# Patient Record
Sex: Female | Born: 1980 | Hispanic: No | Marital: Married | Smoking: Former smoker
Health system: Southern US, Community
[De-identification: ages and names within clinical notes are randomized; demographics above are authoritative.]

## PROBLEM LIST (undated history)

## (undated) DIAGNOSIS — J45909 Unspecified asthma, uncomplicated: Secondary | ICD-10-CM

## (undated) DIAGNOSIS — Y990 Civilian activity done for income or pay: Secondary | ICD-10-CM

## (undated) DIAGNOSIS — Z0000 Encounter for general adult medical examination without abnormal findings: Secondary | ICD-10-CM

## (undated) DIAGNOSIS — Z590 Homelessness: Secondary | ICD-10-CM

## (undated) DIAGNOSIS — F640 Transsexualism: Secondary | ICD-10-CM

## (undated) DIAGNOSIS — A529 Late syphilis, unspecified: Secondary | ICD-10-CM

## (undated) DIAGNOSIS — G5600 Carpal tunnel syndrome, unspecified upper limb: Secondary | ICD-10-CM

## (undated) DIAGNOSIS — B20 Human immunodeficiency virus [HIV] disease: Secondary | ICD-10-CM

## (undated) DIAGNOSIS — F329 Major depressive disorder, single episode, unspecified: Secondary | ICD-10-CM

## (undated) DIAGNOSIS — S61419A Laceration without foreign body of unspecified hand, initial encounter: Secondary | ICD-10-CM

## (undated) DIAGNOSIS — K6289 Other specified diseases of anus and rectum: Secondary | ICD-10-CM

## (undated) DIAGNOSIS — Z21 Asymptomatic human immunodeficiency virus [HIV] infection status: Secondary | ICD-10-CM

## (undated) DIAGNOSIS — M79605 Pain in left leg: Secondary | ICD-10-CM

## (undated) HISTORY — DX: Encounter for general adult medical examination without abnormal findings: Z00.00

## (undated) HISTORY — DX: Homelessness: Z59.0

## (undated) HISTORY — DX: Transsexualism: F64.0

## (undated) HISTORY — DX: Human immunodeficiency virus (HIV) disease: B20

## (undated) HISTORY — DX: Civilian activity done for income or pay: Y99.0

## (undated) HISTORY — DX: Major depressive disorder, single episode, unspecified: F32.9

## (undated) HISTORY — DX: Pain in left leg: M79.605

## (undated) HISTORY — DX: Asymptomatic human immunodeficiency virus (hiv) infection status: Z21

## (undated) HISTORY — DX: Carpal tunnel syndrome, unspecified upper limb: G56.00

## (undated) HISTORY — PX: NASAL SEPTUM SURGERY: SHX37

## (undated) HISTORY — PX: FOOT SURGERY: SHX648

## (undated) HISTORY — DX: Laceration without foreign body of unspecified hand, initial encounter: S61.419A

## (undated) HISTORY — DX: Other specified diseases of anus and rectum: K62.89

## (undated) HISTORY — DX: Late syphilis, unspecified: A52.9

---
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
## 2002-06-09 ENCOUNTER — Emergency Department (HOSPITAL_COMMUNITY): Admission: EM | Admit: 2002-06-09 | Discharge: 2002-06-09 | Payer: Self-pay | Admitting: *Deleted

## 2002-10-01 ENCOUNTER — Emergency Department (HOSPITAL_COMMUNITY): Admission: EM | Admit: 2002-10-01 | Discharge: 2002-10-01 | Payer: Self-pay | Admitting: *Deleted

## 2003-04-27 ENCOUNTER — Emergency Department (HOSPITAL_COMMUNITY): Admission: EM | Admit: 2003-04-27 | Discharge: 2003-04-27 | Payer: Self-pay | Admitting: Emergency Medicine

## 2003-04-27 ENCOUNTER — Encounter: Payer: Self-pay | Admitting: Emergency Medicine

## 2003-04-28 ENCOUNTER — Encounter: Payer: Self-pay | Admitting: Emergency Medicine

## 2005-07-12 ENCOUNTER — Encounter (INDEPENDENT_AMBULATORY_CARE_PROVIDER_SITE_OTHER): Payer: Self-pay | Admitting: *Deleted

## 2005-07-12 LAB — CONVERTED CEMR LAB
CD4 Count: 202 microliters
CD4 T Cell Abs: 202

## 2005-08-06 ENCOUNTER — Ambulatory Visit: Payer: Self-pay | Admitting: Infectious Diseases

## 2005-08-06 ENCOUNTER — Ambulatory Visit (HOSPITAL_COMMUNITY): Admission: RE | Admit: 2005-08-06 | Discharge: 2005-08-06 | Payer: Self-pay | Admitting: Infectious Diseases

## 2005-08-06 ENCOUNTER — Encounter (INDEPENDENT_AMBULATORY_CARE_PROVIDER_SITE_OTHER): Payer: Self-pay | Admitting: *Deleted

## 2005-08-06 LAB — CONVERTED CEMR LAB
CD4 Count: 190 microliters
HIV 1 RNA Quant: 8320 copies/mL

## 2005-09-17 ENCOUNTER — Encounter (INDEPENDENT_AMBULATORY_CARE_PROVIDER_SITE_OTHER): Payer: Self-pay | Admitting: *Deleted

## 2005-09-17 ENCOUNTER — Ambulatory Visit: Payer: Self-pay | Admitting: Infectious Diseases

## 2005-09-17 ENCOUNTER — Ambulatory Visit (HOSPITAL_COMMUNITY): Admission: RE | Admit: 2005-09-17 | Discharge: 2005-09-17 | Payer: Self-pay | Admitting: Infectious Diseases

## 2005-09-17 LAB — CONVERTED CEMR LAB
CD4 Count: 380 microliters
HIV 1 RNA Quant: 399 copies/mL

## 2006-12-09 ENCOUNTER — Encounter (INDEPENDENT_AMBULATORY_CARE_PROVIDER_SITE_OTHER): Payer: Self-pay | Admitting: *Deleted

## 2006-12-09 LAB — CONVERTED CEMR LAB

## 2006-12-22 ENCOUNTER — Encounter (INDEPENDENT_AMBULATORY_CARE_PROVIDER_SITE_OTHER): Payer: Self-pay | Admitting: *Deleted

## 2014-12-13 ENCOUNTER — Telehealth: Payer: Self-pay

## 2014-12-13 NOTE — Telephone Encounter (Signed)
Left message to call for new intake appointment.   Just in case I have scheduled patient for 12-21-14 @ 10 :30   Rhonda Ayers King , RCharity fundraiser

## 2014-12-21 ENCOUNTER — Ambulatory Visit: Payer: Self-pay

## 2014-12-21 ENCOUNTER — Other Ambulatory Visit: Payer: Self-pay

## 2014-12-21 ENCOUNTER — Telehealth: Payer: Self-pay

## 2014-12-21 DIAGNOSIS — Z79899 Other long term (current) drug therapy: Secondary | ICD-10-CM

## 2014-12-21 DIAGNOSIS — B20 Human immunodeficiency virus [HIV] disease: Secondary | ICD-10-CM

## 2014-12-21 DIAGNOSIS — Z202 Contact with and (suspected) exposure to infections with a predominantly sexual mode of transmission: Secondary | ICD-10-CM

## 2014-12-21 DIAGNOSIS — Z21 Asymptomatic human immunodeficiency virus [HIV] infection status: Secondary | ICD-10-CM

## 2014-12-21 LAB — CBC WITH DIFFERENTIAL/PLATELET
Basophils Absolute: 0 10*3/uL (ref 0.0–0.1)
Basophils Relative: 1 % (ref 0–1)
Eosinophils Absolute: 0.2 10*3/uL (ref 0.0–0.7)
Eosinophils Relative: 7 % — ABNORMAL HIGH (ref 0–5)
HCT: 47.1 % (ref 39.0–52.0)
Hemoglobin: 15.9 g/dL (ref 13.0–17.0)
Lymphocytes Relative: 32 % (ref 12–46)
Lymphs Abs: 1 10*3/uL (ref 0.7–4.0)
MCH: 30.2 pg (ref 26.0–34.0)
MCHC: 33.8 g/dL (ref 30.0–36.0)
MCV: 89.5 fL (ref 78.0–100.0)
MPV: 9.9 fL (ref 8.6–12.4)
Monocytes Absolute: 0.2 10*3/uL (ref 0.1–1.0)
Monocytes Relative: 8 % (ref 3–12)
Neutro Abs: 1.6 10*3/uL — ABNORMAL LOW (ref 1.7–7.7)
Neutrophils Relative %: 52 % (ref 43–77)
Platelets: 228 10*3/uL (ref 150–400)
RBC: 5.26 MIL/uL (ref 4.22–5.81)
RDW: 14.1 % (ref 11.5–15.5)
WBC: 3 10*3/uL — ABNORMAL LOW (ref 4.0–10.5)

## 2014-12-21 NOTE — Telephone Encounter (Signed)
Patient arrived late for intake appointment.  He says he was given wrong directions.   I am not able to complete intake and will do labs only.   Medical records from [facility].  Patient recently released from prison and taking Atripla since 2005. Denies any missed doses.   Tammy K King, RN 

## 2014-12-21 NOTE — Progress Notes (Signed)
Patient arrived late for intake appointment.  He says he was given wrong directions.   I am not able to complete intake and will do labs only.   Medical records from [facility].  Patient recently released from prison and taking Atripla since 2005. Denies any missed doses.   Laurell Josephsammy K Jannae Fagerstrom, RN

## 2014-12-22 LAB — HEPATITIS C ANTIBODY: HCV Ab: NEGATIVE

## 2014-12-22 LAB — URINALYSIS
Bilirubin Urine: NEGATIVE
Glucose, UA: NEGATIVE mg/dL
HGB URINE DIPSTICK: NEGATIVE
Ketones, ur: NEGATIVE mg/dL
LEUKOCYTES UA: NEGATIVE
Nitrite: NEGATIVE
PROTEIN: NEGATIVE mg/dL
Specific Gravity, Urine: 1.025 (ref 1.005–1.030)
Urobilinogen, UA: 1 mg/dL (ref 0.0–1.0)
pH: 7.5 (ref 5.0–8.0)

## 2014-12-22 LAB — T-HELPER CELL (CD4) - (RCID CLINIC ONLY)
CD4 % Helper T Cell: 52 % (ref 33–55)
CD4 T CELL ABS: 480 /uL (ref 400–2700)

## 2014-12-22 LAB — COMPLETE METABOLIC PANEL WITH GFR
ALBUMIN: 4 g/dL (ref 3.5–5.2)
ALT: 16 U/L (ref 0–53)
AST: 24 U/L (ref 0–37)
Alkaline Phosphatase: 45 U/L (ref 39–117)
BUN: 9 mg/dL (ref 6–23)
CHLORIDE: 103 meq/L (ref 96–112)
CO2: 24 meq/L (ref 19–32)
Calcium: 9.6 mg/dL (ref 8.4–10.5)
Creat: 0.83 mg/dL (ref 0.50–1.35)
Glucose, Bld: 75 mg/dL (ref 70–99)
POTASSIUM: 4.5 meq/L (ref 3.5–5.3)
Sodium: 140 mEq/L (ref 135–145)
Total Bilirubin: 0.5 mg/dL (ref 0.2–1.2)
Total Protein: 6.7 g/dL (ref 6.0–8.3)

## 2014-12-22 LAB — HEPATITIS B SURFACE ANTIGEN: Hepatitis B Surface Ag: NEGATIVE

## 2014-12-22 LAB — HEPATITIS B CORE ANTIBODY, TOTAL: Hep B Core Total Ab: NONREACTIVE

## 2014-12-22 LAB — LIPID PANEL
CHOLESTEROL: 182 mg/dL (ref 0–200)
HDL: 63 mg/dL (ref >=40)
LDL Cholesterol: 105 mg/dL — ABNORMAL HIGH (ref 0–99)
Total CHOL/HDL Ratio: 2.9 Ratio
Triglycerides: 71 mg/dL (ref <150)
VLDL: 14 mg/dL (ref 0–40)

## 2014-12-22 LAB — HEPATITIS A ANTIBODY, TOTAL: Hep A Total Ab: REACTIVE — AB

## 2014-12-22 LAB — URINE CYTOLOGY ANCILLARY ONLY
Chlamydia: NEGATIVE
NEISSERIA GONORRHEA: NEGATIVE

## 2014-12-22 LAB — HEPATITIS B SURFACE ANTIBODY,QUALITATIVE: HEP B S AB: NEGATIVE

## 2014-12-22 LAB — RPR

## 2014-12-23 LAB — QUANTIFERON TB GOLD ASSAY (BLOOD)
Interferon Gamma Release Assay: NEGATIVE
Mitogen value: 4.41 IU/mL
QUANTIFERON TB AG MINUS NIL: 0 [IU]/mL
Quantiferon Nil Value: 0.06 IU/mL
TB AG VALUE: 0.05 [IU]/mL

## 2014-12-23 LAB — HIV-1 RNA ULTRAQUANT REFLEX TO GENTYP+
HIV 1 RNA Quant: 29 copies/mL — ABNORMAL HIGH (ref <20)
HIV-1 RNA Quant, Log: 1.46 {Log} — ABNORMAL HIGH (ref <1.30)

## 2014-12-27 LAB — HLA B*5701: HLA-B*5701 w/rflx HLA-B High: NEGATIVE

## 2015-01-06 ENCOUNTER — Ambulatory Visit: Payer: Self-pay | Admitting: Infectious Disease

## 2015-01-31 ENCOUNTER — Ambulatory Visit (INDEPENDENT_AMBULATORY_CARE_PROVIDER_SITE_OTHER): Payer: Self-pay | Admitting: Infectious Disease

## 2015-01-31 ENCOUNTER — Encounter: Payer: Self-pay | Admitting: Infectious Disease

## 2015-01-31 VITALS — BP 126/75 | HR 77 | Temp 98.4°F | Wt 227.0 lb

## 2015-01-31 DIAGNOSIS — F649 Gender identity disorder, unspecified: Secondary | ICD-10-CM | POA: Insufficient documentation

## 2015-01-31 DIAGNOSIS — F640 Transsexualism: Secondary | ICD-10-CM | POA: Insufficient documentation

## 2015-01-31 DIAGNOSIS — B20 Human immunodeficiency virus [HIV] disease: Secondary | ICD-10-CM

## 2015-01-31 DIAGNOSIS — Z789 Other specified health status: Secondary | ICD-10-CM

## 2015-01-31 DIAGNOSIS — F641 Gender identity disorder in adolescence and adulthood: Secondary | ICD-10-CM

## 2015-01-31 HISTORY — DX: Other specified health status: Z78.9

## 2015-01-31 HISTORY — DX: Human immunodeficiency virus (HIV) disease: B20

## 2015-01-31 MED ORDER — ABACAVIR-DOLUTEGRAVIR-LAMIVUD 600-50-300 MG PO TABS: 1.0000 | ORAL_TABLET | Freq: Every day | ORAL | Status: DC
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
## 2015-02-02 ENCOUNTER — Other Ambulatory Visit: Payer: Self-pay | Admitting: *Deleted

## 2015-02-02 DIAGNOSIS — B20 Human immunodeficiency virus [HIV] disease: Secondary | ICD-10-CM

## 2015-02-02 MED ORDER — ABACAVIR-DOLUTEGRAVIR-LAMIVUD 600-50-300 MG PO TABS: 1.0000 | ORAL_TABLET | Freq: Every day | ORAL | Status: DC

## 2015-02-02 NOTE — Telephone Encounter (Signed)
ADAP Application 

## 2015-04-01 ENCOUNTER — Telehealth: Payer: Self-pay | Admitting: *Deleted

## 2015-04-01 NOTE — Telephone Encounter (Signed)
Patient called requesting I call his disability case worker, Marylene Land, at [phone] ext [phone] regarding needed paperwork. Called and left a voice mail. There is a signed release on file. [name]

## 2015-09-22 ENCOUNTER — Encounter (HOSPITAL_COMMUNITY): Payer: Self-pay | Admitting: Emergency Medicine

## 2015-09-22 ENCOUNTER — Emergency Department (HOSPITAL_COMMUNITY)
Admission: EM | Admit: 2015-09-22 | Discharge: 2015-09-22 | Disposition: A | Discharge status: Home / Self Care | Payer: Medicaid Other | Attending: Emergency Medicine | Admitting: Emergency Medicine

## 2015-09-22 ENCOUNTER — Emergency Department (HOSPITAL_COMMUNITY): Payer: Medicaid Other

## 2015-09-22 DIAGNOSIS — B20 Human immunodeficiency virus [HIV] disease: Secondary | ICD-10-CM | POA: Insufficient documentation

## 2015-09-22 DIAGNOSIS — F17210 Nicotine dependence, cigarettes, uncomplicated: Secondary | ICD-10-CM | POA: Insufficient documentation

## 2015-09-22 DIAGNOSIS — M25532 Pain in left wrist: Secondary | ICD-10-CM | POA: Insufficient documentation

## 2015-09-22 DIAGNOSIS — Z79899 Other long term (current) drug therapy: Secondary | ICD-10-CM | POA: Insufficient documentation

## 2015-09-22 MED ORDER — OXYCODONE-ACETAMINOPHEN 5-325 MG PO TABS
2.0000 | ORAL_TABLET | Freq: Once | ORAL | Status: AC
  Administered 2015-09-22: 2 via ORAL
  Filled 2015-09-22: qty 2
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
## 2016-01-02 ENCOUNTER — Ambulatory Visit: Payer: Medicaid Other

## 2016-01-02 ENCOUNTER — Other Ambulatory Visit: Payer: Medicaid Other

## 2016-01-04 ENCOUNTER — Other Ambulatory Visit (INDEPENDENT_AMBULATORY_CARE_PROVIDER_SITE_OTHER): Payer: Self-pay

## 2016-01-04 ENCOUNTER — Ambulatory Visit: Payer: Self-pay

## 2016-01-04 DIAGNOSIS — B20 Human immunodeficiency virus [HIV] disease: Secondary | ICD-10-CM

## 2016-01-04 DIAGNOSIS — Z113 Encounter for screening for infections with a predominantly sexual mode of transmission: Secondary | ICD-10-CM

## 2016-01-04 LAB — COMPLETE METABOLIC PANEL WITH GFR
ALBUMIN: 3.4 g/dL — AB (ref 3.6–5.1)
ALK PHOS: 65 U/L (ref 40–115)
ALT: 180 U/L — AB (ref 9–46)
AST: 146 U/L — ABNORMAL HIGH (ref 10–40)
BILIRUBIN TOTAL: 0.4 mg/dL (ref 0.2–1.2)
BUN: 11 mg/dL (ref 7–25)
CO2: 27 mmol/L (ref 20–31)
CREATININE: 0.73 mg/dL (ref 0.60–1.35)
Calcium: 8.8 mg/dL (ref 8.6–10.3)
Chloride: 103 mmol/L (ref 98–110)
GFR, Est African American: >89 mL/min (ref >=60)
GFR, Est Non African American: >89 mL/min (ref >=60)
GLUCOSE: 88 mg/dL (ref 65–99)
Potassium: 4.5 mmol/L (ref 3.5–5.3)
SODIUM: 138 mmol/L (ref 135–146)
TOTAL PROTEIN: 6.9 g/dL (ref 6.1–8.1)

## 2016-01-04 LAB — CBC WITH DIFFERENTIAL/PLATELET
BASOS ABS: 0 10*3/uL (ref 0.0–0.1)
BASOS PCT: 0 % (ref 0–1)
EOS ABS: 0.3 10*3/uL (ref 0.0–0.7)
Eosinophils Relative: 6 % — ABNORMAL HIGH (ref 0–5)
HCT: 39.8 % (ref 39.0–52.0)
HEMOGLOBIN: 13.4 g/dL (ref 13.0–17.0)
LYMPHS ABS: 1.3 10*3/uL (ref 0.7–4.0)
Lymphocytes Relative: 29 % (ref 12–46)
MCH: 29.5 pg (ref 26.0–34.0)
MCHC: 33.7 g/dL (ref 30.0–36.0)
MCV: 87.5 fL (ref 78.0–100.0)
MONOS PCT: 9 % (ref 3–12)
MPV: 9.5 fL (ref 8.6–12.4)
Monocytes Absolute: 0.4 10*3/uL (ref 0.1–1.0)
NEUTROS ABS: 2.5 10*3/uL (ref 1.7–7.7)
NEUTROS PCT: 56 % (ref 43–77)
PLATELETS: 262 10*3/uL (ref 150–400)
RBC: 4.55 MIL/uL (ref 4.22–5.81)
RDW: 13.8 % (ref 11.5–15.5)
WBC: 4.5 10*3/uL (ref 4.0–10.5)

## 2016-01-05 LAB — T-HELPER CELL (CD4) - (RCID CLINIC ONLY)
CD4 % Helper T Cell: 48 % (ref 33–55)
CD4 T Cell Abs: 590 /uL (ref 400–2700)

## 2016-01-06 LAB — HIV-1 RNA QUANT-NO REFLEX-BLD: HIV 1 RNA Quant: <20 copies/mL (ref <20)

## 2016-01-07 LAB — RPR: RPR Ser Ql: REACTIVE — AB

## 2016-01-07 LAB — RPR TITER

## 2016-01-09 LAB — FLUORESCENT TREPONEMAL AB(FTA)-IGG-BLD: Fluorescent Treponemal ABS: REACTIVE — AB

## 2016-01-10 ENCOUNTER — Telehealth: Payer: Self-pay | Admitting: Infectious Disease

## 2016-01-10 ENCOUNTER — Telehealth: Payer: Self-pay | Admitting: *Deleted

## 2016-01-10 DIAGNOSIS — A529 Late syphilis, unspecified: Secondary | ICD-10-CM

## 2016-01-10 NOTE — Telephone Encounter (Signed)
Please enter the orders for this patient. I will call Radiology to schedule after. Andree CossHowell, Michelle M, RN

## 2016-01-10 NOTE — Telephone Encounter (Signed)
-----   Message from Randall Hissornelius N Van Dam, MD sent at 01/07/2016 12:08 PM EDT ----- This patient needs a LUmbar puncture with CSF sent for VDRL, protein, cell count and differential and FTA- Abs. He has VERY high liklihood of Neurosyphilis

## 2016-01-10 NOTE — Telephone Encounter (Signed)
Patient needs fluoro guided LP to evaluate for NEurosyphils  CSF to be sent for   VDRL  Cell count and differential  Protein and glucose  FT-Antbodies (fluorescent treponemal antibodies) this is  A misc test

## 2016-01-10 NOTE — Telephone Encounter (Signed)
Perfect
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
## 2016-01-16 ENCOUNTER — Ambulatory Visit (HOSPITAL_COMMUNITY)
Admission: RE | Admit: 2016-01-16 | Discharge: 2016-01-16 | Disposition: A | Discharge status: Home / Self Care | Payer: Medicaid Other | Source: Ambulatory Visit | Attending: Infectious Disease | Admitting: Infectious Disease

## 2016-01-16 DIAGNOSIS — A529 Late syphilis, unspecified: Secondary | ICD-10-CM | POA: Insufficient documentation

## 2016-01-16 LAB — CSF CELL COUNT WITH DIFFERENTIAL
RBC Count, CSF: 10 /mm3 — ABNORMAL HIGH
Tube #: 1
WBC, CSF: 0 /mm3 (ref 0–5)

## 2016-01-16 LAB — GLUCOSE, CSF: Glucose, CSF: 59 mg/dL (ref 40–70)

## 2016-01-16 LAB — PROTEIN, CSF: TOTAL PROTEIN, CSF: 21 mg/dL (ref 15–45)

## 2016-01-16 NOTE — Discharge Instructions (Signed)
Lumbar Puncture, Care After Refer to this sheet in the next few weeks. These instructions provide you with information on caring for yourself after your procedure. Your health care provider may also give you more specific instructions. Your treatment has been planned according to current medical practices, but problems sometimes occur. Call your health care provider if you have any problems or questions after your procedure. WHAT TO EXPECT AFTER THE PROCEDURE After your procedure, it is typical to have the following sensations:  Mild discomfort or pain at the insertion site.  Mild headache that is relieved with pain medicines. HOME CARE INSTRUCTIONS  Avoid lifting anything heavier than 10 lb (4.5 kg) for at least 12 hours after the procedure.  Drink enough fluids to keep your urine clear or pale yellow. SEEK MEDICAL CARE IF:  You have fever or chills.  You have nausea or vomiting.  You have a headache that lasts for more than 2 days. SEEK IMMEDIATE MEDICAL CARE IF:  You have any numbness or tingling in your legs.  You are unable to control your bowel or bladder.  You have bleeding or swelling in your back at the insertion site.  You are dizzy or faint.   This information is not intended to replace advice given to you by your health care provider. Make sure you discuss any questions you have with your health care provider.   Document Released: 10/06/2013 Document Reviewed: 10/06/2013 Elsevier Interactive Patient Education [employer]2016 Elsevier Inc.   Acknowledgement of Risk of Discharge  Against Medical Advice   And Release of Liability    Because I am choosing to leave the hospital in spite of these risks, I release the hospital, its employees and officers, and my attending physician from all liability for any adverse results caused by my leaving the hospital prematurely.   ________________________________      _____________________________________ Patient's Signature                                         Date and Time   ________________________________      _____________________________________ Witness' Signature                                        Relationship to Patient    ________________________________      _____________________________________ Witness' Signature                                        Relationship to Patient   [If patient refuses to sign, write "Patient refused to sign" on the patient's signature line and have witnesses to the refusal sign as witnesses.]

## 2016-01-16 NOTE — Progress Notes (Signed)
Pt called this RN into the room and stated that he had to leave to attend his sisters wedding that he had forgotten about.  He stated that he had to sing and he was not going to miss it no matter what.  All the risk of leaving were explained to pt and also the benefits of staying.  Dr. Talbert ForestKrishmon notified that pt is leaving.  Pt signed all appropriate AMA forms.  Pt left the unit with full discharge instructions of which he verbalizes understanding.

## 2016-01-17 ENCOUNTER — Telehealth: Payer: Self-pay | Admitting: *Deleted

## 2016-01-17 LAB — VDRL, CSF: SYPHILIS VDRL QUANT CSF: NONREACTIVE

## 2016-01-17 NOTE — Telephone Encounter (Signed)
Patient will come in 4/5, 4/12 and 4/19 afternoons for bicillin injections per Dr. Daiva EvesVan Dam.   Patient asked if Dr. 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 Please advise if/how long the patient can be written out of work. Andree CossHowell, Michelle M, RN

## 2016-01-17 NOTE — Telephone Encounter (Signed)
Rhonda Ayers they actually did get spinal fluid but I dont know if they got everything if he left AMA

## 2016-01-17 NOTE — Telephone Encounter (Signed)
-----   Message from Randall Hissornelius N Van Dam, MD sent at 01/17/2016  4:25 PM EDT ----- While the FTA Ab test does not appear to have been done on his CSf the remainder of the labs have come back and are NOT c/w Neurosyphilis. Therefore the patient can now be treated with IM PCN 2.4 MU weekly x 3 weeks

## 2016-01-17 NOTE — Telephone Encounter (Signed)
She could be out of work for one week maximum

## 2016-01-17 NOTE — Telephone Encounter (Signed)
FYI - Patient left AMA from LP. Did not reschedule.

## 2016-01-18 ENCOUNTER — Emergency Department (HOSPITAL_COMMUNITY)
Admission: EM | Admit: 2016-01-18 | Discharge: 2016-01-18 | Discharge status: Against Medical Advice | Payer: Self-pay | Attending: Emergency Medicine | Admitting: Emergency Medicine

## 2016-01-18 ENCOUNTER — Ambulatory Visit (INDEPENDENT_AMBULATORY_CARE_PROVIDER_SITE_OTHER): Payer: Self-pay | Admitting: *Deleted

## 2016-01-18 ENCOUNTER — Emergency Department (HOSPITAL_COMMUNITY): Payer: Self-pay

## 2016-01-18 ENCOUNTER — Encounter (HOSPITAL_COMMUNITY): Payer: Self-pay | Admitting: Family Medicine

## 2016-01-18 DIAGNOSIS — R519 Headache, unspecified: Secondary | ICD-10-CM

## 2016-01-18 DIAGNOSIS — B20 Human immunodeficiency virus [HIV] disease: Secondary | ICD-10-CM | POA: Insufficient documentation

## 2016-01-18 DIAGNOSIS — A539 Syphilis, unspecified: Secondary | ICD-10-CM

## 2016-01-18 DIAGNOSIS — M542 Cervicalgia: Secondary | ICD-10-CM

## 2016-01-18 DIAGNOSIS — R51 Headache: Secondary | ICD-10-CM | POA: Insufficient documentation

## 2016-01-18 DIAGNOSIS — F17210 Nicotine dependence, cigarettes, uncomplicated: Secondary | ICD-10-CM | POA: Insufficient documentation

## 2016-01-18 DIAGNOSIS — M546 Pain in thoracic spine: Secondary | ICD-10-CM | POA: Insufficient documentation

## 2016-01-18 DIAGNOSIS — M549 Dorsalgia, unspecified: Secondary | ICD-10-CM

## 2016-01-18 DIAGNOSIS — F640 Transsexualism: Secondary | ICD-10-CM | POA: Insufficient documentation

## 2016-01-18 DIAGNOSIS — G8918 Other acute postprocedural pain: Secondary | ICD-10-CM | POA: Insufficient documentation

## 2016-01-18 DIAGNOSIS — Z79899 Other long term (current) drug therapy: Secondary | ICD-10-CM | POA: Insufficient documentation

## 2016-01-18 LAB — CBC WITH DIFFERENTIAL/PLATELET
BASOS ABS: 0.1 10*3/uL (ref 0.0–0.1)
BASOS PCT: 2 %
Eosinophils Absolute: 0.6 10*3/uL (ref 0.0–0.7)
Eosinophils Relative: 10 %
HEMATOCRIT: 43.4 % (ref 39.0–52.0)
Hemoglobin: 14.4 g/dL (ref 13.0–17.0)
LYMPHS PCT: 34 %
Lymphs Abs: 2 10*3/uL (ref 0.7–4.0)
MCH: 29.1 pg (ref 26.0–34.0)
MCHC: 33.2 g/dL (ref 30.0–36.0)
MCV: 87.7 fL (ref 78.0–100.0)
MONO ABS: 0.3 10*3/uL (ref 0.1–1.0)
Monocytes Relative: 4 %
NEUTROS ABS: 2.9 10*3/uL (ref 1.7–7.7)
NEUTROS PCT: 50 %
Platelets: 296 10*3/uL (ref 150–400)
RBC: 4.95 MIL/uL (ref 4.22–5.81)
RDW: 14.3 % (ref 11.5–15.5)
WBC: 5.9 10*3/uL (ref 4.0–10.5)

## 2016-01-18 LAB — COMPREHENSIVE METABOLIC PANEL
ALBUMIN: 3.5 g/dL (ref 3.5–5.0)
ALK PHOS: 68 U/L (ref 38–126)
ALT: 43 U/L (ref 17–63)
ANION GAP: 10 (ref 5–15)
AST: 31 U/L (ref 15–41)
BILIRUBIN TOTAL: 0.6 mg/dL (ref 0.3–1.2)
BUN: 9 mg/dL (ref 6–20)
CALCIUM: 9.4 mg/dL (ref 8.9–10.3)
CO2: 23 mmol/L (ref 22–32)
Chloride: 105 mmol/L (ref 101–111)
Creatinine, Ser: 1.04 mg/dL (ref 0.61–1.24)
Glucose, Bld: 135 mg/dL — ABNORMAL HIGH (ref 65–99)
POTASSIUM: 3.9 mmol/L (ref 3.5–5.1)
Sodium: 138 mmol/L (ref 135–145)
TOTAL PROTEIN: 8.4 g/dL — AB (ref 6.5–8.1)

## 2016-01-18 LAB — I-STAT CG4 LACTIC ACID, ED: Lactic Acid, Venous: 1.36 mmol/L (ref 0.5–2.0)

## 2016-01-18 MED ORDER — SODIUM CHLORIDE 0.9 % IV BOLUS (SEPSIS)
1000.0000 mL | Freq: Once | INTRAVENOUS | Status: AC
Start: 2016-01-18 — End: 2016-01-18
  Administered 2016-01-18: 1000 mL via INTRAVENOUS

## 2016-01-18 MED ORDER — PENICILLIN G BENZATHINE 1200000 UNIT/2ML IM SUSP
1.2000 10*6.[IU] | Freq: Once | INTRAMUSCULAR | Status: AC
  Administered 2016-01-18: 1.2 10*6.[IU] via INTRAMUSCULAR

## 2016-01-18 MED ORDER — DIPHENHYDRAMINE HCL 50 MG/ML IJ SOLN
25.0000 mg | Freq: Once | INTRAMUSCULAR | Status: AC
  Administered 2016-01-18: 25 mg via INTRAVENOUS
  Filled 2016-01-18: qty 1

## 2016-01-18 MED ORDER — METOCLOPRAMIDE HCL 5 MG/ML IJ SOLN
10.0000 mg | Freq: Once | INTRAMUSCULAR | Status: AC
  Administered 2016-01-18: 10 mg via INTRAVENOUS
  Filled 2016-01-18: qty 2

## 2016-01-18 NOTE — ED Notes (Signed)
Patient here withg severe back pain and not feeling right since spinal tap in radiology on Monday. Patient arrived with unsteady gait and trouble sitting

## 2016-01-18 NOTE — Discharge Instructions (Signed)
Please call Dr. Algis LimingVandam in the morning and schedule an appointment in his clinic for further evaluation of her neck and back pain after your lumbar puncture. Return to the emergency department at any time for follow-up lumbar puncture, which would be required in order to make sure that you don't have infection causing the pain in your head, neck, and back. If you develop fevers or worsening symptoms return to the emergency department immediately, as infection could be life-threatening.

## 2016-01-18 NOTE — ED Provider Notes (Signed)
CSN: [id number]     Arrival date & time 01/18/16  1630 History   First MD Initiated Contact with Patient 01/18/16 1853     Chief Complaint  Patient presents with  . back pain post spinal tap       HPI  35 y.o. transgender female with history of HIV presenting with back and neck pain that's been present since recent lumbar puncture that was performed on 4/3. Procedure was performed by interventional radiology, and ordered by infectious disease for concern for neurosyphilis. Per procedure note, patient tolerated the procedure well but left AMA shortly afterwards. The day after the procedure, patient reports development of burning back pain shooting up to the neck. Also endorses throbbing headache that is worse behind the eyes. No vision changes. Denies weakness or sensory disturbances. Feels as if both the neck and back are stiff and patient is afraid to move secondary to the pain that this causes. Denies fevers.   Past Medical History  Diagnosis Date  . HIV infection (HCC)   . 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 No pertinent past surgical history. Family History  Problem Relation Age of Onset  . Diabetes Mother    Social History  Substance Use Topics  . Smoking status: Current Every Day Smoker -- 0.50 packs/day    Types: Cigarettes    Last Attempt to Quit: 12/30/2013  . Smokeless tobacco: None  . Alcohol Use: No    Review of Systems  Constitutional: Negative for fever, chills, activity change and appetite change.  HENT: Negative for congestion, facial swelling, rhinorrhea and sore throat.   Eyes: Negative for visual disturbance.  Respiratory: Negative for cough, shortness of breath and wheezing.   Cardiovascular: Negative for chest pain, palpitations and leg swelling.  Gastrointestinal: Negative for nausea, vomiting, abdominal pain, diarrhea, constipation and blood in stool.  Genitourinary: Negative for dysuria, frequency, hematuria, flank pain and difficulty urinating.   Musculoskeletal: Positive for back pain, neck pain and neck stiffness. Negative for myalgias, joint swelling and arthralgias.  Skin: Negative for rash.  Neurological: Positive for headaches. Negative for dizziness, tremors, seizures, syncope, facial asymmetry, speech difficulty, weakness, light-headedness and numbness.  Psychiatric/Behavioral: Negative for behavioral problems, confusion and agitation.      Allergies  Motrin and Tramadol  Home Medications   Prior to Admission medications   Medication Sig Start Date End Date Taking? Authorizing Provider  acetaminophen (TYLENOL) 500 MG tablet Take 1,000 mg by mouth daily as needed for mild pain.   Yes Historical Provider, MD  efavirenz-emtricitabine-tenofovir (ATRIPLA) 600-200-300 MG tablet Take 1 tablet by mouth at bedtime.   Yes Historical Provider, MD  naproxen sodium (ANAPROX) 220 MG tablet Take 220 mg by mouth daily as needed (general pain).   Yes Historical Provider, MD  Abacavir-Dolutegravir-Lamivud (TRIUMEQ) 600-50-300 MG TABS Take 1 tablet by mouth daily. 02/02/15   Randall Hissornelius N Van Dam, MD   BP 107/63 mmHg  Pulse 69  Temp(Src) 97.3 F (36.3 C) (Oral)  Resp 20  Ht 5\' 6"  (1.676 m)  Wt 102.967 kg  BMI 36.66 kg/m2  SpO2 98% Physical Exam  Constitutional: He is oriented to person, place, and time. He appears well-developed and well-nourished. No distress.  HENT:  Head: Normocephalic and atraumatic.  Right Ear: External ear normal.  Left Ear: External ear normal.  Nose: Nose normal.  Mouth/Throat: Oropharynx is clear and moist. No oropharyngeal exudate.  Eyes: Conjunctivae are normal. Pupils are equal, round, and reactive to light. Right eye exhibits no discharge. Left  eye exhibits no discharge. No scleral icterus.  Neck: No tracheal deviation present.  Patient reports neck stiffness but I'm able to passively perform full range of motion of his neck without discomfort.  Cardiovascular: Normal rate, regular rhythm and normal  heart sounds.  Exam reveals no gallop and no friction rub.   No murmur heard. Pulmonary/Chest: Effort normal and breath sounds normal. No respiratory distress. He has no wheezes. He has no rales.  Abdominal: Soft. Bowel sounds are normal. He exhibits no distension and no mass. There is no tenderness. There is no rebound and no guarding.  Musculoskeletal: Normal range of motion. He exhibits tenderness (diffuse tenderness to palpation over the C, T, L-spine.). He exhibits no edema.  Neurological: He is alert and oriented to person, place, and time. He exhibits normal muscle tone.  5/5 strength in the proximal and distal upper and lower extremities bilaterally with intact sensation to light touch. Normal speech, normal gait.  Skin: Skin is warm and dry. No rash noted. He is not diaphoretic.  Psychiatric: He has a normal mood and affect. His behavior is normal. Judgment and thought content normal.    ED Course  Procedures (including critical care time) Labs Review Labs Reviewed  COMPREHENSIVE METABOLIC PANEL - Abnormal; Notable for the following:    Glucose, Bld 135 (*)    Total Protein 8.4 (*)    All other components within normal limits  CBC WITH DIFFERENTIAL/PLATELET  I-STAT CG4 LACTIC ACID, ED  I-STAT CG4 LACTIC ACID, ED    Imaging Review Ct Head Wo Contrast  01/18/2016  CLINICAL DATA:  Sudden onset of severe headache. EXAM: CT HEAD WITHOUT CONTRAST TECHNIQUE: Contiguous axial images were obtained from the base of the skull through the vertex without intravenous contrast. COMPARISON:  None. FINDINGS: Brain: No evidence of acute infarction, hemorrhage, extra-axial collection, ventriculomegaly, or mass effect. Cavum septum callosum, normal variant incidentally noted. Vascular: No hyperdense vessel or unexpected calcification. Skull: Negative for fracture or focal lesion. Sinuses/Orbits: Pan paranasal sinus mucosal thickening. Opacification of scattered left mastoid air cells. Other: None.  IMPRESSION: 1.  No acute intracranial abnormality. 2. Paranasal sinus disease. Electronically Signed   By: Rubye Oaks M.D.   On: 01/18/2016 22:36   I have personally reviewed and evaluated these images and lab results as part of my medical decision-making.   EKG Interpretation None      MDM   Final diagnoses:  Back pain, unspecified location  Neck pain  Nonintractable headache, unspecified chronicity pattern, unspecified headache type    Patient is generally well-appearing. Afebrile and hemodynamically stable. Reports diffuse pain in her entire back and neck as well as headache behind the bilateral eyes. Reports neck stiffness but I'm able to fully passively range her neck without discomfort. CT head performed that reveals no acute intracranial abnormality. CBC with no leukocytosis. CMP unremarkable and patient does not have a lactic acidosis.  Given recent lumbar puncture and now with new onset headache, neck stiffness, and back pain, there is potential concern for meningo-encephalitis. I discussed with the patient the need to perform a lumbar puncture to rule out infectious source. Patient is adamant that she does not want to undergo another lumbar puncture and refuses further diagnostic testing. I discussed with her the risks of foregoing this procedure, including further clinical deterioration, sepsis, and death. Patient is able to explain to me the risks of not undergoing the procedure and is competent to make her own decision regarding foregoing further testing. She has agreed  to call her infectious disease doctor, Dr. Algis Liming, in the morning to schedule an appointment for a second opinion. I've asked her to please return to the emergency department at any time for further workup. She has asked for narcotic pain medications but I've explained to her that it would not be appropriate for me to prescribe these without performing a full diagnostic workup to determine the cause of her pain.  She will be discharged AGAINST MEDICAL ADVICE.    Levita Monical Ernestina Penna, MD 01/18/16 1610  Zadie Rhine, MD 01/18/16 [phone]

## 2016-01-18 NOTE — Telephone Encounter (Signed)
Perfect

## 2016-01-18 NOTE — Telephone Encounter (Signed)
Notified patient. She will pick up the note when she comes in for treatment today. Thanks!

## 2016-01-18 NOTE — Patient Instructions (Signed)
Patient advised to walk and use heat to buttocks.  Pt given CDC handout "Syphilis."

## 2016-01-19 ENCOUNTER — Telehealth: Payer: Self-pay | Admitting: *Deleted

## 2016-01-19 NOTE — Telephone Encounter (Signed)
Radiology can apply a "blood patch" if this does not improve this is likely a post spinal HA and is likely worse when he stands. If he had stayed in IR and rested as they had instructed the chance of him developig this would be much less. He should take ibuprofen 800mg  three times daily and tylenol 1 g qid for pain

## 2016-01-19 NOTE — Telephone Encounter (Signed)
Patient called to report that since having the lumbar puncture Monday 01/16/16 he is having trouble walking, headaches and lower back pain. He advised that it is sever and he went back to the hospital last night 01/18/16 but did not feel comfortable and left because they did not understand what he was trying to tell them about his pain and he wants to be seen by Dr Daiva EvesVan Dam. Advised the patient Daiva EvesVan Dam is not in clinic today but will get him a message about what is going on.  Patient left the ED AMA. Patient has an appt 02/01/16

## 2016-01-21 LAB — MISC LABCORP TEST (SEND OUT): Labcorp test code: 6379

## 2016-01-24 ENCOUNTER — Other Ambulatory Visit: Payer: Self-pay | Admitting: *Deleted

## 2016-01-24 DIAGNOSIS — B20 Human immunodeficiency virus [HIV] disease: Secondary | ICD-10-CM

## 2016-01-24 MED ORDER — ABACAVIR-DOLUTEGRAVIR-LAMIVUD 600-50-300 MG PO TABS: 1.0000 | ORAL_TABLET | Freq: Every day | ORAL | Status: DC

## 2016-01-25 ENCOUNTER — Ambulatory Visit: Payer: Medicaid Other

## 2016-01-26 ENCOUNTER — Ambulatory Visit (INDEPENDENT_AMBULATORY_CARE_PROVIDER_SITE_OTHER): Payer: Self-pay

## 2016-01-26 ENCOUNTER — Other Ambulatory Visit: Payer: Self-pay

## 2016-01-26 DIAGNOSIS — A539 Syphilis, unspecified: Secondary | ICD-10-CM

## 2016-01-26 MED ORDER — PENICILLIN G BENZATHINE 1200000 UNIT/2ML IM SUSP
1.2000 10*6.[IU] | Freq: Once | INTRAMUSCULAR | Status: AC
  Administered 2016-01-26 (×2): 1.2 10*6.[IU] via INTRAMUSCULAR

## 2016-01-26 MED ORDER — PENICILLIN G BENZATHINE 1200000 UNIT/2ML IM SUSP: 1.2000 10*6.[IU] | Freq: Once | INTRAMUSCULAR | Status: DC

## 2016-01-26 NOTE — Progress Notes (Signed)
Patient in to receive second series of Bicillin shots per Dr. Daiva EvesVan Dam order. Patient tolerated intramuscular injections well. No complaints of dizziness or pain. Patient walked out of clinic. Rejeana Brockandace Murray, LPN

## 2016-02-01 ENCOUNTER — Ambulatory Visit (INDEPENDENT_AMBULATORY_CARE_PROVIDER_SITE_OTHER): Payer: Self-pay | Admitting: *Deleted

## 2016-02-01 ENCOUNTER — Ambulatory Visit: Payer: Medicaid Other | Admitting: Infectious Disease

## 2016-02-01 DIAGNOSIS — A539 Syphilis, unspecified: Secondary | ICD-10-CM

## 2016-02-01 MED ORDER — PENICILLIN G BENZATHINE 1200000 UNIT/2ML IM SUSP
1.2000 10*6.[IU] | Freq: Once | INTRAMUSCULAR | Status: AC
  Administered 2016-02-01: 1.2 10*6.[IU] via INTRAMUSCULAR

## 2016-02-21 ENCOUNTER — Encounter: Payer: Self-pay | Admitting: Infectious Disease

## 2016-02-28 ENCOUNTER — Encounter: Payer: Self-pay | Admitting: Infectious Disease

## 2016-03-07 ENCOUNTER — Encounter: Payer: Self-pay | Admitting: Infectious Disease

## 2016-03-07 ENCOUNTER — Ambulatory Visit (INDEPENDENT_AMBULATORY_CARE_PROVIDER_SITE_OTHER): Payer: Self-pay | Admitting: Infectious Disease

## 2016-03-07 ENCOUNTER — Other Ambulatory Visit (HOSPITAL_COMMUNITY)
Admission: RE | Admit: 2016-03-07 | Discharge: 2016-03-07 | Disposition: A | Discharge status: Home / Self Care | Payer: No Typology Code available for payment source | Source: Ambulatory Visit | Attending: Infectious Disease | Admitting: Infectious Disease

## 2016-03-07 VITALS — BP 119/69 | HR 62 | Temp 97.5°F | Wt 210.0 lb

## 2016-03-07 DIAGNOSIS — Z113 Encounter for screening for infections with a predominantly sexual mode of transmission: Secondary | ICD-10-CM

## 2016-03-07 DIAGNOSIS — Z789 Other specified health status: Secondary | ICD-10-CM

## 2016-03-07 DIAGNOSIS — M25521 Pain in right elbow: Secondary | ICD-10-CM

## 2016-03-07 DIAGNOSIS — B20 Human immunodeficiency virus [HIV] disease: Secondary | ICD-10-CM

## 2016-03-07 DIAGNOSIS — F640 Transsexualism: Secondary | ICD-10-CM

## 2016-03-07 DIAGNOSIS — M79621 Pain in right upper arm: Secondary | ICD-10-CM

## 2016-03-07 DIAGNOSIS — A529 Late syphilis, unspecified: Secondary | ICD-10-CM

## 2016-03-07 HISTORY — DX: Late syphilis, unspecified: A52.9

## 2016-03-07 LAB — CBC WITH DIFFERENTIAL/PLATELET
BASOS ABS: 40 {cells}/uL (ref 0–200)
Basophils Relative: 1 %
EOS ABS: 320 {cells}/uL (ref 15–500)
Eosinophils Relative: 8 %
HEMATOCRIT: 44.9 % (ref 38.5–50.0)
HEMOGLOBIN: 15.1 g/dL (ref 13.2–17.1)
LYMPHS ABS: 2080 {cells}/uL (ref 850–3900)
Lymphocytes Relative: 52 %
MCH: 29.9 pg (ref 27.0–33.0)
MCHC: 33.6 g/dL (ref 32.0–36.0)
MCV: 88.9 fL (ref 80.0–100.0)
MONO ABS: 320 {cells}/uL (ref 200–950)
MPV: 9.4 fL (ref 7.5–12.5)
Monocytes Relative: 8 %
NEUTROS PCT: 31 %
Neutro Abs: 1240 cells/uL — ABNORMAL LOW (ref 1500–7800)
Platelets: 216 10*3/uL (ref 140–400)
RBC: 5.05 MIL/uL (ref 4.20–5.80)
RDW: 14.5 % (ref 11.0–15.0)
WBC: 4 10*3/uL (ref 3.8–10.8)

## 2016-03-07 NOTE — Progress Notes (Signed)
Subjective:    Patient ID: Rhonda Ayers, female    DOB: 04/09/1981, 35 y.o.   MRN: [id number]  HPI   35 year old man who when I last saw him had  transgender female identity who is  with HIV disease diagnosed in 201990s but then started on Atripla in 2005.  He states that he currently still feels like he is a woman but currently prefers to be done a fight is a man. She he is going back to church and reading gaging with his speech [employer] and trying to get his life in order first before he considers transgender identity again.  He claims he is highly compliant with his antiretroviral medications. He did contract syphilis though he claims that he has not been sexual active with anyone his RPR was over thousand. We had an LP performed which did not show evidence of neurosyphilis.  He has had some pain in his right arm waxes and wane and wanes and sounds like it may be due to some compression of the nerve when he sleeps better at night.  Past Medical History  Diagnosis Date  . HIV infection (HCC)   . 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  Family History  Problem Relation Age of Onset  . Diabetes Mother       Social History   Social History  . Marital Status: Single    Spouse Name: N/A  . Number of Children: N/A  . Years of Education: N/A   Social History Main Topics  . Smoking status: Current Every Day Smoker -- 0.50 packs/day    Types: Cigarettes    Last Attempt to Quit: 12/30/2013  . Smokeless tobacco: None  . Alcohol Use: No  . Drug Use: No  . Sexual Activity: No   Other Topics Concern  . None   Social History Narrative    Allergies  Allergen Reactions  . Motrin [Ibuprofen]   . Tramadol Other (See Comments)    "shakes"     Current outpatient prescriptions:  .  abacavir-dolutegravir-lamiVUDine (TRIUMEQ) 600-50-300 MG tablet, Take 1 tablet by mouth daily., Disp: 30 tablet, Rfl: 0 .  acetaminophen (TYLENOL) 500  MG tablet, Take 1,000 mg by mouth daily as needed for mild pain., Disp: , Rfl:  .  naproxen sodium (ANAPROX) 220 MG tablet, Take 220 mg by mouth daily as needed (general pain)., Disp: , Rfl:     Review of Systems  Constitutional: Negative for fever, chills, diaphoresis, activity change, appetite change, fatigue and unexpected weight change.  HENT: Negative for congestion, rhinorrhea, sinus pressure, sneezing, sore throat and trouble swallowing.   Eyes: Negative for photophobia and visual disturbance.  Respiratory: Negative for cough, chest tightness, shortness of breath, wheezing and stridor.   Cardiovascular: Negative for chest pain, palpitations and leg swelling.  Gastrointestinal: Negative for nausea, vomiting, abdominal pain, diarrhea, constipation, blood in stool, abdominal distention and anal bleeding.  Genitourinary: Negative for dysuria, hematuria, flank pain and difficulty urinating.  Musculoskeletal: Positive for arthralgias. Negative for myalgias, back pain, joint swelling and gait problem.  Skin: Negative for color change, pallor, rash and wound.  Neurological: Negative for dizziness, tremors, weakness and light-headedness.  Hematological: Negative for adenopathy. Does not bruise/bleed easily.  Psychiatric/Behavioral: Negative for behavioral problems, confusion, sleep disturbance, dysphoric mood, decreased concentration and agitation.       Objective:   Physical Exam  Constitutional: He is oriented to person, place, and time.  He appears well-developed and well-nourished.  HENT:  Head: Normocephalic and atraumatic.  Eyes: Conjunctivae and EOM are normal.  Neck: Normal range of motion. Neck supple.  Cardiovascular: Normal rate and regular rhythm.   Pulmonary/Chest: Effort normal. No respiratory distress. He has no wheezes.  Abdominal: Soft. He exhibits no distension.  Musculoskeletal: Normal range of motion. He exhibits no edema or tenderness.  Neurological: He is alert and  oriented to person, place, and time.  Skin: Skin is warm and dry. No rash noted. No erythema. No pallor.  Psychiatric: He has a normal mood and affect. His behavior is normal. Judgment and thought content normal.          Assessment & Plan:   HIV: Check labs today and continue  TRIUMEQ   Transgender female to female: He is now identified as a mail again.  Syphilis: No neurosyphilis by lumbar puncture 3 doses of penicillin at been given recheck titers in 4-6 months time. Will check oral rectal and urine gonorrhea and chlamydia  Arm pain: encouraged to try sleeping on back or opposite side possibly.  I spent greater than 40 minutes with the patient including greater than 50% of time in face to face counsel of the patient re HIV, transgender identity syphilis and STD screening as well as arm pain  and in coordination of their care.

## 2016-03-07 NOTE — Patient Instructions (Signed)
  Make appt to do ADAP in early July

## 2016-03-08 LAB — CYTOLOGY, (ORAL, ANAL, URETHRAL) ANCILLARY ONLY
CHLAMYDIA, DNA PROBE: NEGATIVE
CHLAMYDIA, DNA PROBE: NEGATIVE
NEISSERIA GONORRHEA: NEGATIVE
Neisseria Gonorrhea: NEGATIVE

## 2016-03-08 LAB — COMPLETE METABOLIC PANEL WITH GFR
ALBUMIN: 3.9 g/dL (ref 3.6–5.1)
ALK PHOS: 54 U/L (ref 40–115)
ALT: 12 U/L (ref 9–46)
AST: 18 U/L (ref 10–40)
BUN: 13 mg/dL (ref 7–25)
CHLORIDE: 103 mmol/L (ref 98–110)
CO2: 26 mmol/L (ref 20–31)
Calcium: 9.3 mg/dL (ref 8.6–10.3)
Creat: 0.98 mg/dL (ref 0.60–1.35)
GFR, Est African American: >89 mL/min (ref >=60)
GFR, Est Non African American: >89 mL/min (ref >=60)
GLUCOSE: 80 mg/dL (ref 65–99)
POTASSIUM: 3.5 mmol/L (ref 3.5–5.3)
SODIUM: 138 mmol/L (ref 135–146)
Total Bilirubin: 0.3 mg/dL (ref 0.2–1.2)
Total Protein: 7.1 g/dL (ref 6.1–8.1)

## 2016-03-08 LAB — URINE CYTOLOGY ANCILLARY ONLY
CHLAMYDIA, DNA PROBE: NEGATIVE
NEISSERIA GONORRHEA: NEGATIVE

## 2016-03-08 LAB — T-HELPER CELL (CD4) - (RCID CLINIC ONLY)
CD4 T CELL HELPER: 48 % (ref 33–55)
CD4 T Cell Abs: 1070 /uL (ref 400–2700)

## 2016-03-08 LAB — HIV-1 RNA QUANT-NO REFLEX-BLD: HIV 1 RNA Quant: <20 copies/mL (ref <20)

## 2016-03-27 ENCOUNTER — Other Ambulatory Visit: Payer: Self-pay | Admitting: *Deleted

## 2016-03-27 DIAGNOSIS — B20 Human immunodeficiency virus [HIV] disease: Secondary | ICD-10-CM

## 2016-03-27 MED ORDER — ABACAVIR-DOLUTEGRAVIR-LAMIVUD 600-50-300 MG PO TABS: 1.0000 | ORAL_TABLET | Freq: Every day | ORAL | Status: DC

## 2016-04-23 ENCOUNTER — Ambulatory Visit: Payer: Medicaid Other

## 2016-07-03 ENCOUNTER — Encounter: Payer: Self-pay | Admitting: Infectious Disease

## 2016-07-03 ENCOUNTER — Ambulatory Visit (INDEPENDENT_AMBULATORY_CARE_PROVIDER_SITE_OTHER): Payer: Self-pay | Admitting: Infectious Disease

## 2016-07-03 VITALS — Ht 66.0 in | Wt 214.0 lb

## 2016-07-03 DIAGNOSIS — G5600 Carpal tunnel syndrome, unspecified upper limb: Secondary | ICD-10-CM

## 2016-07-03 DIAGNOSIS — F640 Transsexualism: Secondary | ICD-10-CM

## 2016-07-03 DIAGNOSIS — F329 Major depressive disorder, single episode, unspecified: Secondary | ICD-10-CM

## 2016-07-03 DIAGNOSIS — B20 Human immunodeficiency virus [HIV] disease: Secondary | ICD-10-CM

## 2016-07-03 DIAGNOSIS — F32A Depression, unspecified: Secondary | ICD-10-CM | POA: Insufficient documentation

## 2016-07-03 DIAGNOSIS — G5603 Carpal tunnel syndrome, bilateral upper limbs: Secondary | ICD-10-CM

## 2016-07-03 DIAGNOSIS — Z23 Encounter for immunization: Secondary | ICD-10-CM

## 2016-07-03 DIAGNOSIS — M79605 Pain in left leg: Secondary | ICD-10-CM

## 2016-07-03 DIAGNOSIS — A529 Late syphilis, unspecified: Secondary | ICD-10-CM

## 2016-07-03 DIAGNOSIS — Z789 Other specified health status: Secondary | ICD-10-CM

## 2016-07-03 DIAGNOSIS — Z113 Encounter for screening for infections with a predominantly sexual mode of transmission: Secondary | ICD-10-CM

## 2016-07-03 HISTORY — DX: Carpal tunnel syndrome, unspecified upper limb: G56.00

## 2016-07-03 HISTORY — DX: Pain in left leg: M79.605

## 2016-07-03 HISTORY — DX: Depression, unspecified: F32.A

## 2016-07-03 LAB — CBC WITH DIFFERENTIAL/PLATELET
BASOS ABS: 0 {cells}/uL (ref 0–200)
Basophils Relative: 0 %
EOS PCT: 5 %
Eosinophils Absolute: 265 cells/uL (ref 15–500)
HCT: 46.7 % (ref 38.5–50.0)
Hemoglobin: 15.9 g/dL (ref 13.2–17.1)
LYMPHS PCT: 24 %
Lymphs Abs: 1272 cells/uL (ref 850–3900)
MCH: 30.8 pg (ref 27.0–33.0)
MCHC: 34 g/dL (ref 32.0–36.0)
MCV: 90.3 fL (ref 80.0–100.0)
MONOS PCT: 9 %
MPV: 10 fL (ref 7.5–12.5)
Monocytes Absolute: 477 cells/uL (ref 200–950)
NEUTROS PCT: 62 %
Neutro Abs: 3286 cells/uL (ref 1500–7800)
PLATELETS: 209 10*3/uL (ref 140–400)
RBC: 5.17 MIL/uL (ref 4.20–5.80)
RDW: 14.7 % (ref 11.0–15.0)
WBC: 5.3 10*3/uL (ref 3.8–10.8)

## 2016-07-03 MED ORDER — ABACAVIR-DOLUTEGRAVIR-LAMIVUD 600-50-300 MG PO TABS: 1.0000 | ORAL_TABLET | Freq: Every day | ORAL | 11 refills | Status: DC

## 2016-07-03 NOTE — Progress Notes (Signed)
Subjective:   Chief complaint: followup for HIV disease on medications   Patient ID: Rhonda Ayers, female    DOB: Mar 22, 1981, 35 y.o.   MRN: [id number]  HPI  35 year old man who previously was   transgender female identity but now is cis-gender female. His  HIV disease diagnosed in 44s but then started on Atripla in 2005.  He told me today that he spent 10 years in prison for a crime he did not commit. He has told me that he does not want now to be a woman anymore out of respect of his [place] fathers wishes, due to spirituality and also  "not wanting drama in his life"  He claims to have NOT been sexually active at all though his recent RPR in the thousans makes this difficult to believe. His CSF was not c/w Neurosyphilis and he was rx with 3 IM PCN doses. He tested negative for GC and chlamydia in urine, OP and rectum.   He says he suffers from pain in left side with prolonged standing and asked if this could be cancer. I told him this is more likely related to an injury of MSK system. He also claims to have carpal tunnel syndrome.  Lab Results  Component Value Date   HIV1RNAQUANT <20 03/07/2016   HIV1RNAQUANT <20 01/04/2016   HIV1RNAQUANT 29 (H) 12/21/2014   Lab Results  Component Value Date   CD4TABS 1,070 03/07/2016   CD4TABS 590 01/04/2016   CD4TABS 480 12/21/2014      Past Medical History:  Diagnosis Date  . HIV infection (HCC)   . 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  Family History  Problem Relation Age of Onset  . Diabetes Mother       Social History   Social History  . Marital status: Single    Spouse name: N/A  . Number of children: N/A  . Years of education: N/A   Social History Main Topics  . Smoking status: Light Tobacco Smoker    Packs/day: 0.10    Types: Cigarettes    Last attempt to quit: 12/30/2013  . Smokeless tobacco: Never Used  . Alcohol use No  . Drug use: No  . Sexual activity:  No     Comment: given condoms   Other Topics Concern  . None   Social History Narrative  . None    Allergies  Allergen Reactions  . Motrin [Ibuprofen]   . Tramadol Other (See Comments)    "shakes"     Current Outpatient Prescriptions:  .  abacavir-dolutegravir-lamiVUDine (TRIUMEQ) 600-50-300 MG tablet, Take 1 tablet by mouth daily., Disp: 30 tablet, Rfl: 4 .  acetaminophen (TYLENOL) 500 MG tablet, Take 1,000 mg by mouth daily as needed for mild pain., Disp: , Rfl:  .  naproxen sodium (ANAPROX) 220 MG tablet, Take 220 mg by mouth daily as needed (general pain)., Disp: , Rfl:     Review of Systems  Constitutional: Negative for activity change, appetite change, chills, diaphoresis, fatigue, fever and unexpected weight change.  HENT: Negative for congestion, rhinorrhea, sinus pressure, sneezing, sore throat and trouble swallowing.   Eyes: Negative for photophobia and visual disturbance.  Respiratory: Negative for cough, chest tightness, shortness of breath, wheezing and stridor.   Cardiovascular: Negative for chest pain, palpitations and leg swelling.  Gastrointestinal: Negative for abdominal distention, abdominal pain, anal bleeding, blood in stool, constipation, diarrhea, nausea and vomiting.  Genitourinary: Negative for  difficulty urinating, dysuria, flank pain and hematuria.  Musculoskeletal: Positive for arthralgias. Negative for back pain, gait problem, joint swelling and myalgias.  Skin: Negative for color change, pallor, rash and wound.  Neurological: Negative for dizziness, tremors, weakness and light-headedness.  Hematological: Negative for adenopathy. Does not bruise/bleed easily.  Psychiatric/Behavioral: Negative for agitation, behavioral problems, confusion, decreased concentration, dysphoric mood and sleep disturbance.       Objective:   Physical Exam  Constitutional: He is oriented to person, place, and time. He appears well-developed and well-nourished.  HENT:    Head: Normocephalic and atraumatic.  Eyes: Conjunctivae and EOM are normal.  Neck: Normal range of motion. Neck supple.  Cardiovascular: Normal rate and regular rhythm.   Pulmonary/Chest: Effort normal. No respiratory distress. He has no wheezes.  Abdominal: Soft. He exhibits no distension.  Musculoskeletal: Normal range of motion. He exhibits no edema or tenderness.  Neurological: He is alert and oriented to person, place, and time.  Skin: Skin is warm and dry. No rash noted. No erythema. No pallor.  Psychiatric: He has a normal mood and affect. His behavior is normal. Judgment and thought content normal.          Assessment & Plan:   HIV: continue  TRIUMEQ check labs today and, RTC in 4-6 months  Transgender female to female in the past: He is now identified as a mail again. Will continue to be mindful of this  Syphilis: No neurosyphilis by lumbar puncture 3 doses of penicillin at been given recheck titers today.  Leg pain: would refer him to Sports Medicine or PT if this persists.  ? Carpal tunnel syndrome: wrist splints at night  Depression: he is in good spirits but will need to watch for triggers re his prior transgender identity, his HIV  I spent greater than 40 minutes with the patient including greater than 50% of time in face to face counsel of the patient re HIV, prior transgender identity syphilis and hand pain, leg pain  and in coordination o his care.

## 2016-07-04 ENCOUNTER — Ambulatory Visit: Payer: Medicaid Other

## 2016-07-04 LAB — COMPLETE METABOLIC PANEL WITH GFR
ALBUMIN: 4.2 g/dL (ref 3.6–5.1)
ALK PHOS: 57 U/L (ref 40–115)
ALT: 15 U/L (ref 9–46)
AST: 25 U/L (ref 10–40)
BUN: 12 mg/dL (ref 7–25)
CO2: 26 mmol/L (ref 20–31)
Calcium: 9.2 mg/dL (ref 8.6–10.3)
Chloride: 104 mmol/L (ref 98–110)
Creat: 0.96 mg/dL (ref 0.60–1.35)
GFR, Est African American: >89 mL/min (ref >=60)
GLUCOSE: 76 mg/dL (ref 65–99)
POTASSIUM: 4.2 mmol/L (ref 3.5–5.3)
SODIUM: 139 mmol/L (ref 135–146)
Total Bilirubin: 0.6 mg/dL (ref 0.2–1.2)
Total Protein: 7.2 g/dL (ref 6.1–8.1)

## 2016-07-04 LAB — FLUORESCENT TREPONEMAL AB(FTA)-IGG-BLD: FLUORESCENT TREPONEMAL ABS: REACTIVE — AB

## 2016-07-04 LAB — RPR: RPR: REACTIVE — AB

## 2016-07-04 LAB — RPR TITER

## 2016-07-05 LAB — URINE CYTOLOGY ANCILLARY ONLY
CHLAMYDIA, DNA PROBE: NEGATIVE
Neisseria Gonorrhea: NEGATIVE

## 2016-07-05 LAB — T-HELPER CELL (CD4) - (RCID CLINIC ONLY)
CD4 T CELL HELPER: 41 % (ref 33–55)
CD4 T Cell Abs: 590 /uL (ref 400–2700)

## 2016-07-06 LAB — HIV RNA, RTPCR W/R GT (RTI, PI,INT): HIV 1 RNA Quant: <20 copies/mL

## 2016-07-19 DIAGNOSIS — Z23 Encounter for immunization: Secondary | ICD-10-CM

## 2016-07-19 NOTE — Addendum Note (Signed)
Addended by: Andree CossHOWELL, Meribeth Vitug M on: 07/19/2016 10:23 AM   Modules accepted: Orders

## 2016-08-01 ENCOUNTER — Ambulatory Visit: Payer: Medicaid Other

## 2016-12-12 ENCOUNTER — Other Ambulatory Visit: Payer: Self-pay

## 2016-12-12 ENCOUNTER — Telehealth: Payer: Self-pay | Admitting: *Deleted

## 2016-12-12 DIAGNOSIS — Z23 Encounter for immunization: Secondary | ICD-10-CM

## 2016-12-12 DIAGNOSIS — B20 Human immunodeficiency virus [HIV] disease: Secondary | ICD-10-CM

## 2016-12-12 LAB — CBC WITH DIFFERENTIAL/PLATELET
BASOS PCT: 1 %
Basophils Absolute: 30 cells/uL (ref 0–200)
EOS PCT: 4 %
Eosinophils Absolute: 120 cells/uL (ref 15–500)
HCT: 46.4 % (ref 38.5–50.0)
HEMOGLOBIN: 15.4 g/dL (ref 13.2–17.1)
LYMPHS ABS: 1260 {cells}/uL (ref 850–3900)
Lymphocytes Relative: 42 %
MCH: 29.8 pg (ref 27.0–33.0)
MCHC: 33.2 g/dL (ref 32.0–36.0)
MCV: 89.9 fL (ref 80.0–100.0)
MPV: 9.7 fL (ref 7.5–12.5)
Monocytes Absolute: 210 cells/uL (ref 200–950)
Monocytes Relative: 7 %
NEUTROS ABS: 1380 {cells}/uL — AB (ref 1500–7800)
Neutrophils Relative %: 46 %
Platelets: 248 10*3/uL (ref 140–400)
RBC: 5.16 MIL/uL (ref 4.20–5.80)
RDW: 13.5 % (ref 11.0–15.0)
WBC: 3 10*3/uL — ABNORMAL LOW (ref 3.8–10.8)

## 2016-12-12 NOTE — Telephone Encounter (Signed)
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  Rash appears erythemic and macular covering dorsal and ventral sides of bilateral forearms. Patient uses chemical cleaning solutions at work.  RN advised patient to go to his employer and request to be seen by their Workman's Comprehensive MD/Urgent Care for this problem.  Patient stated that he would see his HR person at work today.

## 2016-12-13 LAB — COMPLETE METABOLIC PANEL WITH GFR
ALBUMIN: 3.9 g/dL (ref 3.6–5.1)
ALK PHOS: 54 U/L (ref 40–115)
ALT: 14 U/L (ref 9–46)
AST: 17 U/L (ref 10–40)
BILIRUBIN TOTAL: 0.4 mg/dL (ref 0.2–1.2)
BUN: 11 mg/dL (ref 7–25)
CO2: 21 mmol/L (ref 20–31)
Calcium: 9.5 mg/dL (ref 8.6–10.3)
Chloride: 106 mmol/L (ref 98–110)
Creat: 0.9 mg/dL (ref 0.60–1.35)
GFR, Est African American: >89 mL/min (ref >=60)
GFR, Est Non African American: >89 mL/min (ref >=60)
GLUCOSE: 111 mg/dL — AB (ref 65–99)
Potassium: 4.6 mmol/L (ref 3.5–5.3)
SODIUM: 138 mmol/L (ref 135–146)
TOTAL PROTEIN: 6.7 g/dL (ref 6.1–8.1)

## 2016-12-13 LAB — RPR TITER: RPR Titer: 1:2 {titer}

## 2016-12-13 LAB — LIPID PANEL
Cholesterol: 163 mg/dL (ref <200)
HDL: 46 mg/dL (ref >40)
LDL CALC: 89 mg/dL (ref <100)
Total CHOL/HDL Ratio: 3.5 Ratio (ref <5.0)
Triglycerides: 141 mg/dL (ref <150)
VLDL: 28 mg/dL (ref <30)

## 2016-12-13 LAB — RPR: RPR Ser Ql: REACTIVE — AB

## 2016-12-13 LAB — FLUORESCENT TREPONEMAL AB(FTA)-IGG-BLD: FLUORESCENT TREPONEMAL ABS: REACTIVE — AB

## 2016-12-13 LAB — T-HELPER CELL (CD4) - (RCID CLINIC ONLY)
CD4 % Helper T Cell: 39 % (ref 33–55)
CD4 T Cell Abs: 490 /uL (ref 400–2700)

## 2016-12-16 LAB — HIV RNA, RTPCR W/R GT (RTI, PI,INT)
HIV-1 RNA, QN PCR: 21 copies/mL — ABNORMAL HIGH
HIV-1 RNA, QN PCR: 1.32 {Log_copies}/mL — AB

## 2016-12-26 ENCOUNTER — Ambulatory Visit: Payer: Medicaid Other | Admitting: Infectious Disease

## 2017-01-28 ENCOUNTER — Other Ambulatory Visit: Payer: Self-pay | Admitting: Infectious Disease

## 2017-01-28 ENCOUNTER — Ambulatory Visit (INDEPENDENT_AMBULATORY_CARE_PROVIDER_SITE_OTHER): Payer: Self-pay | Admitting: Infectious Disease

## 2017-01-28 ENCOUNTER — Encounter: Payer: Self-pay | Admitting: Infectious Disease

## 2017-01-28 VITALS — BP 129/79 | HR 73 | Temp 98.7°F | Wt 205.0 lb

## 2017-01-28 DIAGNOSIS — Z789 Other specified health status: Secondary | ICD-10-CM

## 2017-01-28 DIAGNOSIS — Z0000 Encounter for general adult medical examination without abnormal findings: Secondary | ICD-10-CM

## 2017-01-28 DIAGNOSIS — B20 Human immunodeficiency virus [HIV] disease: Secondary | ICD-10-CM

## 2017-01-28 DIAGNOSIS — A529 Late syphilis, unspecified: Secondary | ICD-10-CM

## 2017-01-28 DIAGNOSIS — F640 Transsexualism: Secondary | ICD-10-CM

## 2017-01-28 HISTORY — DX: Encounter for general adult medical examination without abnormal findings: Z00.00

## 2017-01-28 MED ORDER — BICTEGRAVIR-EMTRICITAB-TENOFOV 50-200-25 MG PO TABS: 1.0000 | ORAL_TABLET | Freq: Every day | ORAL | 11 refills | Status: DC

## 2017-01-28 NOTE — Progress Notes (Signed)
Subjective:   Chief complaint: followup for HIV disease on medications, concern re risk for diabetes and prostate cancer   Patient ID: Rhonda Ayers, female    DOB: 10-13-1981, 36 y.o.   MRN: [id number]  HPI  36 year old now trans gender woman, "Special"  Her  HIV disease diagnosed in 71s but then started on Atripla in 2005.  She has missed ADAP renewal and run out of TRIUMEQ and started Atripla that she had left over from when she was in prison.   She is concerned re risk for DM and need for prostate cancer screening.     Lab Results  Component Value Date   HIV1RNAQUANT <20 07/03/2016   HIV1RNAQUANT <20 03/07/2016   HIV1RNAQUANT <20 01/04/2016   Lab Results  Component Value Date   CD4TABS 490 12/12/2016   CD4TABS 590 07/03/2016   CD4TABS 1,070 03/07/2016      Past Medical History:  Diagnosis Date  . Carpal tunnel syndrome 07/03/2016  . Depression 07/03/2016  . HIV infection (HCC)   . 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 Left leg pain 07/03/2016  . 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  Family History  Problem Relation Age of Onset  . Diabetes Mother       Social History   Social History  . Marital status: Single    Spouse name: N/A  . Number of children: N/A  . Years of education: N/A   Social History Main Topics  . Smoking status: Light Tobacco Smoker    Packs/day: 0.10    Types: Cigarettes    Last attempt to quit: 12/30/2013  . Smokeless tobacco: Never Used  . Alcohol use No  . Drug use: No  . Sexual activity: No     Comment: given condoms   Other Topics Concern  . Not on file   Social History Narrative  . No narrative on file    Allergies  Allergen Reactions  . Motrin [Ibuprofen]   . Tramadol Other (See Comments)    "shakes"     Current Outpatient Prescriptions:  .  abacavir-dolutegravir-lamiVUDine (TRIUMEQ) 600-50-300 MG tablet, Take 1 tablet by mouth daily., Disp: 30 tablet, Rfl: 11 .  acetaminophen (TYLENOL) 500 MG  tablet, Take 1,000 mg by mouth daily as needed for mild pain., Disp: , Rfl:  .  naproxen sodium (ANAPROX) 220 MG tablet, Take 220 mg by mouth daily as needed (general pain)., Disp: , Rfl:     Review of Systems  Constitutional: Negative for activity change, appetite change, chills, diaphoresis, fatigue, fever and unexpected weight change.  HENT: Negative for congestion, rhinorrhea, sinus pressure, sneezing, sore throat and trouble swallowing.   Eyes: Negative for photophobia and visual disturbance.  Respiratory: Negative for cough, chest tightness, shortness of breath, wheezing and stridor.   Cardiovascular: Negative for chest pain, palpitations and leg swelling.  Gastrointestinal: Negative for abdominal distention, abdominal pain, anal bleeding, blood in stool, constipation, diarrhea, nausea and vomiting.  Genitourinary: Negative for difficulty urinating, dysuria, flank pain and hematuria.  Musculoskeletal: Negative for back pain, gait problem, joint swelling and myalgias.  Skin: Negative for color change, pallor, rash and wound.  Neurological: Negative for dizziness, tremors, weakness and light-headedness.  Hematological: Negative for adenopathy. Does not bruise/bleed easily.  Psychiatric/Behavioral: Negative for agitation, behavioral problems, confusion, decreased concentration, dysphoric mood and sleep disturbance.       Objective:   Physical Exam  Constitutional: He is oriented to person, place, and time. He  appears well-developed and well-nourished.  HENT:  Head: Normocephalic and atraumatic.  Eyes: Conjunctivae and EOM are normal.  Neck: Normal range of motion. Neck supple.  Cardiovascular: Normal rate and regular rhythm.   Pulmonary/Chest: Effort normal. No respiratory distress. He has no wheezes.  Abdominal: Soft. He exhibits no distension.  Musculoskeletal: Normal range of motion. He exhibits no edema or tenderness.  Neurological: He is alert and oriented to person, place,  and time.  Skin: Skin is warm and dry. No rash noted. No erythema. No pallor.  Psychiatric: He has a normal mood and affect. His behavior is normal. Judgment and thought content normal.  Much more animated today than at last visit          Assessment & Plan:   HIV: continu Atripla then change to [facility] check labs today and, RTC in 1 month with ID pharmacy then with me.  Transgender female to female in the past:  Will discuss further at next  Visit.    Syphilis: No neurosyphilis by lumbar puncture 3 doses of penicillin at been Titers coming down  Depression: she is in good spirits today  Screen for DM: check A1c  Prostate cancer concern: I do not see indication for screening at this age.  I spent greater than 35 minutes with the patient including greater than 50% of time in face to face counsel of the patient re HIV, ARV reginmen, transgender identity syphilis   and in coordination o her care.

## 2017-01-29 LAB — HEMOGLOBIN A1C
HEMOGLOBIN A1C: 5.6 % (ref <5.7)
MEAN PLASMA GLUCOSE: 114 mg/dL

## 2017-01-30 LAB — CYTOLOGY, (ORAL, ANAL, URETHRAL) ANCILLARY ONLY
CHLAMYDIA, DNA PROBE: NEGATIVE
Chlamydia: POSITIVE — AB
NEISSERIA GONORRHEA: NEGATIVE
Neisseria Gonorrhea: POSITIVE — AB

## 2017-01-30 LAB — T-HELPER CELL (CD4) - (RCID CLINIC ONLY)
CD4 % Helper T Cell: 43 % (ref 33–55)
CD4 T CELL ABS: 670 /uL (ref 400–2700)

## 2017-01-31 ENCOUNTER — Telehealth: Payer: Self-pay | Admitting: *Deleted

## 2017-01-31 LAB — HIV-1 RNA,QN PCR W/REFLEX GENOTYPE
HIV-1 RNA, QN PCR: 134 Copies/mL — ABNORMAL HIGH
HIV-1 RNA, QN PCR: 2.13 Log cps/mL — ABNORMAL HIGH

## 2017-01-31 NOTE — Telephone Encounter (Signed)
Per note from [facility] called the patient to advise of +test and need or treatment. Had to leave a message for him to call the office.

## 2017-02-04 ENCOUNTER — Ambulatory Visit (INDEPENDENT_AMBULATORY_CARE_PROVIDER_SITE_OTHER): Payer: Self-pay | Admitting: *Deleted

## 2017-02-04 DIAGNOSIS — Z113 Encounter for screening for infections with a predominantly sexual mode of transmission: Secondary | ICD-10-CM

## 2017-02-04 MED ORDER — CEFTRIAXONE SODIUM 250 MG IJ SOLR
250.0000 mg | Freq: Once | INTRAMUSCULAR | Status: AC
  Administered 2017-02-04: 250 mg via INTRAMUSCULAR

## 2017-02-04 MED ORDER — AZITHROMYCIN 250 MG PO TABS
250.0000 mg | ORAL_TABLET | Freq: Once | ORAL | Status: AC
  Administered 2017-02-04: 250 mg via ORAL

## 2017-02-04 NOTE — Telephone Encounter (Signed)
Patient left message in triage this morning, returning call. RN returned his call, had to leave a message advising him he needed to speak with [place] regarding lab results and schedule treatment. Andree Coss, RN

## 2017-02-04 NOTE — Telephone Encounter (Signed)
Patient requesting to come today for treatment per Dr Daiva Eves. Will be here at 3:30. Orders per Dr Daiva Eves: Patient with rectal GC and chlamydia: She needs IM Ceftriaxone  and oral azithromycin. Hopefully HIV is coming under better control soon. Should also receive condoms

## 2017-02-07 ENCOUNTER — Encounter: Payer: Self-pay | Admitting: Infectious Disease

## 2017-02-19 ENCOUNTER — Telehealth: Payer: Self-pay

## 2017-02-19 DIAGNOSIS — B20 Human immunodeficiency virus [HIV] disease: Secondary | ICD-10-CM

## 2017-02-19 MED ORDER — BICTEGRAVIR-EMTRICITAB-TENOFOV 50-200-25 MG PO TABS: 1.0000 | ORAL_TABLET | Freq: Every day | ORAL | 6 refills | Status: DC

## 2017-02-19 MED ORDER — BICTEGRAVIR-EMTRICITAB-TENOFOV 50-200-25 MG PO TABS: 1.0000 | ORAL_TABLET | Freq: Every day | ORAL | Status: DC

## 2017-02-19 NOTE — Addendum Note (Signed)
Addended by: Laurell JosephsKING, TAMMY K on: 02/19/2017 12:14 PM   Modules accepted: Orders

## 2017-02-19 NOTE — Telephone Encounter (Signed)
Biktarvy was added to his regimen list at his April, 2018 visit.  I will send this refill to pharmacy   Laurell Josephsammy K King, RN

## 2017-02-19 NOTE — Telephone Encounter (Signed)
Pharmacy calling since patient is requesting refill of Triumeq . His last refill was 06-2016.

## 2017-02-19 NOTE — Telephone Encounter (Signed)
Error

## 2017-02-19 NOTE — Addendum Note (Signed)
Addended by: Tomasita MorrowKING, Eithel Ryall K on: 02/19/2017 11:50 AM   Modules accepted: Orders

## 2017-02-27 ENCOUNTER — Ambulatory Visit: Payer: Medicaid Other

## 2017-03-20 ENCOUNTER — Ambulatory Visit: Payer: Medicaid Other | Admitting: Infectious Disease

## 2017-07-09 ENCOUNTER — Telehealth: Payer: Self-pay
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
## 2017-07-10 ENCOUNTER — Other Ambulatory Visit: Payer: Self-pay

## 2017-07-12 ENCOUNTER — Ambulatory Visit: Payer: Self-pay

## 2017-07-12 ENCOUNTER — Ambulatory Visit: Payer: Self-pay | Admitting: Infectious Diseases

## 2017-07-12 ENCOUNTER — Telehealth: Payer: Self-pay | Admitting: *Deleted

## 2017-07-12 ENCOUNTER — Ambulatory Visit: Payer: Self-pay | Admitting: Licensed Clinical Social Worker

## 2017-07-12 NOTE — Telephone Encounter (Signed)
Patient called to advise he is not able to make it today as his nephew had an allergic reaction and he had to take him to the ED [facility]) he advised he will call back to reschedule with Cordelia Pen and will stop by Monday to get medication figured out with [facility].

## 2017-07-15 ENCOUNTER — Telehealth: Payer: Self-pay | Admitting: Licensed Clinical Social Worker

## 2017-07-15 NOTE — Telephone Encounter (Signed)
[facility] called patient and arranged appointment for 10/8 at 1:30 pm.  Vergia Alberts, [facility]

## 2017-07-22 ENCOUNTER — Institutional Professional Consult (permissible substitution): Payer: Self-pay | Admitting: Licensed Clinical Social Worker

## 2017-07-24 ENCOUNTER — Other Ambulatory Visit: Payer: Self-pay

## 2017-08-05 ENCOUNTER — Encounter: Payer: Self-pay | Admitting: Infectious Disease

## 2017-08-05 ENCOUNTER — Ambulatory Visit (INDEPENDENT_AMBULATORY_CARE_PROVIDER_SITE_OTHER): Payer: Self-pay | Admitting: Infectious Disease

## 2017-08-05 ENCOUNTER — Telehealth: Payer: Self-pay | Admitting: *Deleted

## 2017-08-05 VITALS — BP 120/80 | HR 80 | Temp 98.4°F | Ht 65.5 in | Wt 207.0 lb

## 2017-08-05 DIAGNOSIS — F640 Transsexualism: Secondary | ICD-10-CM

## 2017-08-05 DIAGNOSIS — R238 Other skin changes: Secondary | ICD-10-CM

## 2017-08-05 DIAGNOSIS — B20 Human immunodeficiency virus [HIV] disease: Secondary | ICD-10-CM

## 2017-08-05 DIAGNOSIS — Z23 Encounter for immunization: Secondary | ICD-10-CM

## 2017-08-05 DIAGNOSIS — Z789 Other specified health status: Secondary | ICD-10-CM

## 2017-08-05 MED ORDER — BICTEGRAVIR-EMTRICITAB-TENOFOV 50-200-25 MG PO TABS: 1.0000 | ORAL_TABLET | Freq: Every day | ORAL | 11 refills | Status: DC

## 2017-08-05 NOTE — Telephone Encounter (Signed)
Pharmacy called, as patient requested refill of biktarvy, but they have not filled this in 5 months.  [facility] will need either 1) new prescription or 2) permission to fill.  RN spoke with patient. She states she had extra medication at home that were still being delivered while she was incarcerated. Patient confirmed that she IS keeping her appointment with Dr Daiva EvesVan Dam today and is bringing her pay stubs for RW/ADAP renewal.  Patient is scheduled for an appointment at 3:45 with Dr Lennox LaityVan Dam. Dupree Givler, Zachary GeorgeMichelle M, RN
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
## 2017-08-06 ENCOUNTER — Ambulatory Visit: Payer: Self-pay

## 2017-08-06 LAB — COMPLETE METABOLIC PANEL WITH GFR
AG Ratio: 1.4 (calc) (ref 1.0–2.5)
ALBUMIN MSPROF: 4.4 g/dL (ref 3.6–5.1)
ALT: 16 U/L (ref 9–46)
AST: 28 U/L (ref 10–40)
Alkaline phosphatase (APISO): 60 U/L (ref 40–115)
BUN: 13 mg/dL (ref 7–25)
CALCIUM: 9.8 mg/dL (ref 8.6–10.3)
CO2: 29 mmol/L (ref 20–32)
CREATININE: 1.05 mg/dL (ref 0.60–1.35)
Chloride: 105 mmol/L (ref 98–110)
GFR, EST AFRICAN AMERICAN: 107 mL/min/{1.73_m2} (ref >=60)
GFR, EST NON AFRICAN AMERICAN: 92 mL/min/{1.73_m2} (ref >=60)
Globulin: 3.1 g/dL (calc) (ref 1.9–3.7)
Glucose, Bld: 93 mg/dL (ref 65–99)
Potassium: 3.9 mmol/L (ref 3.5–5.3)
Sodium: 140 mmol/L (ref 135–146)
TOTAL PROTEIN: 7.5 g/dL (ref 6.1–8.1)
Total Bilirubin: 0.5 mg/dL (ref 0.2–1.2)

## 2017-08-06 LAB — CBC WITH DIFFERENTIAL/PLATELET
Basophils Absolute: 29 cells/uL (ref 0–200)
Basophils Relative: 0.8 %
EOS ABS: 79 {cells}/uL (ref 15–500)
Eosinophils Relative: 2.2 %
HEMATOCRIT: 47.9 % (ref 38.5–50.0)
Hemoglobin: 16.4 g/dL (ref 13.2–17.1)
LYMPHS ABS: 1724 {cells}/uL (ref 850–3900)
MCH: 29.5 pg (ref 27.0–33.0)
MCHC: 34.2 g/dL (ref 32.0–36.0)
MCV: 86.3 fL (ref 80.0–100.0)
MPV: 10.4 fL (ref 7.5–12.5)
Monocytes Relative: 5.8 %
NEUTROS PCT: 43.3 %
Neutro Abs: 1559 cells/uL (ref 1500–7800)
Platelets: 203 10*3/uL (ref 140–400)
RBC: 5.55 10*6/uL (ref 4.20–5.80)
RDW: 13 % (ref 11.0–15.0)
Total Lymphocyte: 47.9 %
WBC: 3.6 10*3/uL — AB (ref 3.8–10.8)
WBCMIX: 209 {cells}/uL (ref 200–950)

## 2017-08-06 LAB — RPR TITER

## 2017-08-06 LAB — RPR: RPR Ser Ql: REACTIVE — AB

## 2017-08-06 LAB — HEPATITIS B SURFACE ANTIBODY, QUANTITATIVE: HEPATITIS B-POST: 25 m[IU]/mL (ref >=10)

## 2017-08-06 LAB — FLUORESCENT TREPONEMAL AB(FTA)-IGG-BLD: FLUORESCENT TREPONEMAL ABS: REACTIVE — AB

## 2017-08-07 ENCOUNTER — Telehealth: Payer: Self-pay | Admitting: *Deleted

## 2017-08-07 LAB — CYTOLOGY, (ORAL, ANAL, URETHRAL) ANCILLARY ONLY
CHLAMYDIA, DNA PROBE: NEGATIVE
CHLAMYDIA, DNA PROBE: POSITIVE — AB
NEISSERIA GONORRHEA: NEGATIVE
Neisseria Gonorrhea: NEGATIVE

## 2017-08-07 LAB — T-HELPER CELL (CD4) - (RCID CLINIC ONLY)
CD4 T CELL ABS: 810 /uL (ref 400–2700)
CD4 T CELL HELPER: 39 % (ref 33–55)

## 2017-08-07 LAB — URINE CYTOLOGY ANCILLARY ONLY
Chlamydia: NEGATIVE
Neisseria Gonorrhea: NEGATIVE

## 2017-08-07 NOTE — Telephone Encounter (Signed)
RN spoke with patient. She has not had any treatment since 01/2016. Special will come the next 3 Thursdays for bicillin 2.4 million units injection per Dr Daiva EvesVan Dam.  She requested to meet with a long term counselor if available to "get back to myself."  Appointment made with Mel AlmondJanet Cecil for Thursday after the nurse visit. Andree CossHowell, Ferd Horrigan M, RN  Daiva EvesVan Dam, Lisette Grinderornelius N, MD  P [facility] Triage Nurse Pool        If Special has not been treated for syphilis since we last saw her then she needs another 2.4 MU of PCN weekly x 3 weeks

## 2017-08-07 NOTE — Telephone Encounter (Signed)
Very good thanks so much Marcelino DusterMichelle!

## 2017-08-08 ENCOUNTER — Telehealth: Payer: Self-pay | Admitting: *Deleted

## 2017-08-08 ENCOUNTER — Other Ambulatory Visit: Payer: Self-pay | Admitting: *Deleted

## 2017-08-08 ENCOUNTER — Ambulatory Visit: Payer: Self-pay

## 2017-08-08 ENCOUNTER — Ambulatory Visit (INDEPENDENT_AMBULATORY_CARE_PROVIDER_SITE_OTHER): Payer: Self-pay | Admitting: *Deleted

## 2017-08-08 DIAGNOSIS — A539 Syphilis, unspecified: Secondary | ICD-10-CM

## 2017-08-08 MED ORDER — PENICILLIN G BENZATHINE 1200000 UNIT/2ML IM SUSP
1.2000 10*6.[IU] | Freq: Once | INTRAMUSCULAR | Status: AC
  Administered 2017-08-08: 1.2 10*6.[IU] via INTRAMUSCULAR

## 2017-08-08 MED ORDER — DOXYCYCLINE HYCLATE 100 MG PO TABS: 100.0000 mg | ORAL_TABLET | Freq: Two times a day (BID) | ORAL | 0 refills | Status: DC

## 2017-08-08 NOTE — Telephone Encounter (Signed)
Patient received first dose of bicillin and notified [facility]. Wendall MolaJacqueline Cockerham

## 2017-08-08 NOTE — Telephone Encounter (Signed)
-----   Message from Randall Hissornelius N Van Dam, MD sent at 08/07/2017  5:41 PM EDT ----- Would give pt doxycyline 100mg  po bid x 21 days

## 2017-08-09 LAB — HIV RNA, RTPCR W/R GT (RTI, PI,INT)
HIV 1 RNA QUANT: 123 {copies}/mL — AB
HIV-1 RNA QUANT, LOG: 2.09 {Log_copies}/mL — AB

## 2017-08-15 ENCOUNTER — Ambulatory Visit: Payer: Self-pay

## 2017-08-15 ENCOUNTER — Ambulatory Visit (INDEPENDENT_AMBULATORY_CARE_PROVIDER_SITE_OTHER): Payer: Self-pay | Admitting: *Deleted

## 2017-08-15 DIAGNOSIS — A539 Syphilis, unspecified: Secondary | ICD-10-CM

## 2017-08-15 MED ORDER — PENICILLIN G BENZATHINE 1200000 UNIT/2ML IM SUSP
1.2000 10*6.[IU] | Freq: Once | INTRAMUSCULAR | Status: AC
  Administered 2017-08-15: 1.2 10*6.[IU] via INTRAMUSCULAR

## 2017-08-22 ENCOUNTER — Ambulatory Visit: Payer: Self-pay

## 2017-08-22 ENCOUNTER — Ambulatory Visit (INDEPENDENT_AMBULATORY_CARE_PROVIDER_SITE_OTHER): Payer: Self-pay

## 2017-08-22 DIAGNOSIS — A539 Syphilis, unspecified: Secondary | ICD-10-CM

## 2017-08-22 MED ORDER — PENICILLIN G BENZATHINE 1200000 UNIT/2ML IM SUSP
1.2000 10*6.[IU] | Freq: Once | INTRAMUSCULAR | Status: AC
  Administered 2017-08-22: 1.2 10*6.[IU] via INTRAMUSCULAR

## 2017-08-22 NOTE — Progress Notes (Signed)
Bicillin 2.4 million units split into 2 injections administered per verbal order from Dr. Daiva EvesVan Dam. Client educated on importance of condom use. Towanda OctaveLanatra Marsha Hillman

## 2017-08-27 ENCOUNTER — Other Ambulatory Visit: Payer: Self-pay | Admitting: Infectious Disease

## 2017-09-01 ENCOUNTER — Emergency Department (HOSPITAL_COMMUNITY)
Admission: EM | Admit: 2017-09-01 | Discharge: 2017-09-01 | Disposition: A | Discharge status: Home / Self Care | Payer: Self-pay | Attending: Emergency Medicine | Admitting: Emergency Medicine

## 2017-09-01 ENCOUNTER — Emergency Department (HOSPITAL_COMMUNITY): Payer: Self-pay

## 2017-09-01 ENCOUNTER — Other Ambulatory Visit: Payer: Self-pay

## 2017-09-01 ENCOUNTER — Encounter (HOSPITAL_COMMUNITY): Payer: Self-pay

## 2017-09-01 DIAGNOSIS — Z5321 Procedure and treatment not carried out due to patient leaving prior to being seen by health care provider: Secondary | ICD-10-CM | POA: Insufficient documentation

## 2017-09-01 DIAGNOSIS — R109 Unspecified abdominal pain: Secondary | ICD-10-CM | POA: Insufficient documentation

## 2017-09-01 LAB — I-STAT TROPONIN, ED: Troponin i, poc: 0 ng/mL (ref 0.00–0.08)

## 2017-09-01 LAB — COMPREHENSIVE METABOLIC PANEL
ALT: 17 U/L (ref 17–63)
AST: 28 U/L (ref 15–41)
Albumin: 4.3 g/dL (ref 3.5–5.0)
Alkaline Phosphatase: 57 U/L (ref 38–126)
Anion gap: 8 (ref 5–15)
BUN: 13 mg/dL (ref 6–20)
CHLORIDE: 101 mmol/L (ref 101–111)
CO2: 27 mmol/L (ref 22–32)
CREATININE: 1.03 mg/dL (ref 0.61–1.24)
Calcium: 9.4 mg/dL (ref 8.9–10.3)
GFR calc non Af Amer: >60 mL/min (ref >60)
Glucose, Bld: 143 mg/dL — ABNORMAL HIGH (ref 65–99)
Potassium: 3.3 mmol/L — ABNORMAL LOW (ref 3.5–5.1)
SODIUM: 136 mmol/L (ref 135–145)
Total Bilirubin: 1.2 mg/dL (ref 0.3–1.2)
Total Protein: 7.8 g/dL (ref 6.5–8.1)

## 2017-09-01 LAB — CBC
HCT: 48.9 % (ref 39.0–52.0)
HEMOGLOBIN: 16.5 g/dL (ref 13.0–17.0)
MCH: 30.3 pg (ref 26.0–34.0)
MCHC: 33.7 g/dL (ref 30.0–36.0)
MCV: 89.9 fL (ref 78.0–100.0)
PLATELETS: 216 10*3/uL (ref 150–400)
RBC: 5.44 MIL/uL (ref 4.22–5.81)
RDW: 14.2 % (ref 11.5–15.5)
WBC: 2.7 10*3/uL — ABNORMAL LOW (ref 4.0–10.5)

## 2017-09-01 LAB — LIPASE, BLOOD: LIPASE: 21 U/L (ref 11–51)

## 2017-09-01 NOTE — ED Notes (Signed)
Called x2

## 2017-09-01 NOTE — ED Triage Notes (Signed)
Pt reports generalized abdominal pain and L sided chest pain since yesterday. Also states that he had multiple episodes of emesis today and yesterday. A&Ox4. Ambulatory.

## 2017-09-01 NOTE — ED Notes (Signed)
Pt not in lobby. Called x3.  

## 2017-09-01 NOTE — ED Notes (Signed)
Pt called for room. Not in lobby,  

## 2017-09-02 ENCOUNTER — Other Ambulatory Visit: Payer: Self-pay

## 2017-09-02 ENCOUNTER — Encounter (HOSPITAL_COMMUNITY): Payer: Self-pay | Admitting: Emergency Medicine

## 2017-09-02 ENCOUNTER — Ambulatory Visit (HOSPITAL_COMMUNITY)
Admission: EM | Admit: 2017-09-02 | Discharge: 2017-09-02 | Disposition: A | Discharge status: Home / Self Care | Payer: Self-pay | Attending: Family Medicine | Admitting: Family Medicine

## 2017-09-02 DIAGNOSIS — B9689 Other specified bacterial agents as the cause of diseases classified elsewhere: Secondary | ICD-10-CM

## 2017-09-02 DIAGNOSIS — J329 Chronic sinusitis, unspecified: Secondary | ICD-10-CM

## 2017-09-02 MED ORDER — AZITHROMYCIN 250 MG PO TABS: 250.0000 mg | ORAL_TABLET | Freq: Every day | ORAL | 0 refills | Status: AC

## 2017-09-02 MED ORDER — AZITHROMYCIN 250 MG PO TABS: 250.0000 mg | ORAL_TABLET | Freq: Every day | ORAL | 0 refills | Status: DC

## 2017-09-02 NOTE — Discharge Instructions (Addendum)
Prescription for azithromycin sent to [facility]  Continue to take Tylenol or Ibuprofen for fevers  Continue to take Mucinex for congestion; add a daily allergy pill like Zyrtec, Claritin or store brand. If congestion is severe try flonase intranasal spray, do NOT use for > 4 days.   May use cool mist humidifier, or try saline irrigations.

## 2017-09-02 NOTE — ED Provider Notes (Signed)
[facility]    CSN: [id number] Arrival date & time: 09/02/17  1354     History   Chief Complaint Chief Complaint  Patient presents with  . flu-like symptoms    HPI Rhonda Ayers is a 36 y.o. female with history of Syphillis and HIV coming in for evaluation of URI symptoms. He has had a fever up to 101, congestion, cough, chest tightness, body aches for 1 week.  He has tried Mucinex and Ibuprofen. He vomited one time, but believes it was related to taking ibuprofen. He is around a lot of younger children and family members who have been sick.  He follows-up regularly with the infectious disease clinic and states his disease is currently undetectable.   HPI  Past Medical History:  Diagnosis Date  . Carpal tunnel syndrome 07/03/2016  . Depression 07/03/2016  . Health care maintenance 01/28/2017  . HIV infection (HCC)   . 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 Left leg pain 07/03/2016  . 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 Health care maintenance 01/28/2017  . Carpal tunnel syndrome 07/03/2016  . Left leg pain 07/03/2016  . Depression 07/03/2016  . 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 HIV disease (HCC) 01/31/2015  . Transgender 01/31/2015    History reviewed. No pertinent surgical history.     Home Medications    Prior to Admission medications   Medication Sig Start Date End Date Taking? Authorizing Provider  bictegravir-emtricitabine-tenofovir AF (BIKTARVY) 50-200-25 MG TABS tablet Take 1 tablet by mouth daily. 08/05/17  Yes Daiva EvesVan Dam, Lisette Grinderornelius N, MD  acetaminophen (TYLENOL) 500 MG tablet Take 1,000 mg by mouth daily as needed for mild pain.    [provider]  azithromycin (ZITHROMAX Z-PAK) 250 MG tablet Take 1 tablet (250 mg total) daily for 5 days by mouth. 09/02/17 09/07/17  Ashtian Villacis C, PA-C  doxycycline (VIBRA-TABS) 100 MG tablet Take 1 tablet (100 mg total) by mouth 2 (two) times daily. 08/08/17   Randall HissVan Dam,  Cornelius N, MD  naproxen sodium (ANAPROX) 220 MG tablet Take 220 mg by mouth daily as needed (general pain).    [provider]    Family History Family History  Problem Relation Age of Onset  . Diabetes Mother     Social History Social History   Tobacco Use  . Smoking status: Light Tobacco Smoker    Packs/day: 0.10    Types: Cigarettes    Last attempt to quit: 12/30/2013    Years since quitting: 3.6  . Smokeless tobacco: Never Used  Substance Use Topics  . Alcohol use: Yes    Alcohol/week: 0.0 oz    Comment: occasional  . 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   Allergies   Motrin [ibuprofen] and Tramadol  Allergies Reviewed  Review of Systems Review of Systems  Constitutional: Positive for appetite change, chills, fatigue and fever.  HENT: Positive for congestion, rhinorrhea and sinus pressure. Negative for ear pain, postnasal drip, sneezing, sore throat and trouble swallowing.   Eyes: Negative for pain.  Respiratory: Positive for cough and chest tightness. Negative for wheezing.   Gastrointestinal: Negative for abdominal pain.  Musculoskeletal: Positive for myalgias. Negative for neck stiffness.  Neurological: Positive for weakness and headaches.     Physical Exam Triage Vital Signs ED Triage Vitals [09/02/17 1436]  Enc Vitals Group     BP 119/64     Pulse Rate 68  Resp      Temp 98.8 F (37.1 C)     Temp Source Oral     SpO2 98 %     Weight      Height      Head Circumference      Peak Flow      Pain Score 9     Pain Loc      Pain Edu?      Excl. in GC?    No data found.  Updated Vital Signs BP 119/64 (BP Location: Left Arm)   Pulse 68   Temp 98.8 F (37.1 C) (Oral)   SpO2 98%    Physical Exam  Constitutional: He is oriented to person, place, and time. He appears well-developed and well-nourished.  HENT:  Head: Normocephalic.  Right Ear: Tympanic membrane normal.  Left Ear: Tympanic membrane normal.    Nose: Rhinorrhea present.  Mouth/Throat: Oropharynx is clear and moist and mucous membranes are normal. No oropharyngeal exudate.  Tenderness to maxillary sinuses  Eyes: EOM are normal.  Neck: Normal range of motion.  Cardiovascular: Normal rate and regular rhythm.  Pulmonary/Chest: Effort normal and breath sounds normal.  Abdominal: Soft. He exhibits no distension.  Lymphadenopathy:    He has no cervical adenopathy.  Neurological: He is alert and oriented to person, place, and time.  Skin: Skin is warm.     UC Treatments / Results  Labs (all labs ordered are listed, but only abnormal results are displayed) Labs Reviewed - No data to display  EKG  EKG Interpretation None       Radiology Dg Chest 2 View  Result Date: 09/01/2017 CLINICAL DATA:  Pt c/o n/v, lethargy, anterior cp bilaterally and lower lung angle area pain bilaterally x 3 days. Hx smoker EXAM: CHEST  2 VIEW COMPARISON:  None. FINDINGS: The heart size and mediastinal contours are within normal limits. Both lungs are clear. The visualized skeletal structures are unremarkable. IMPRESSION: No active cardiopulmonary disease. No evidence of pneumonia or pulmonary edema. Electronically Signed   By: Bary RichardStan  Maynard M.D.   On: 09/01/2017 16:26    Procedures Procedures (including critical care time)  Medications Ordered in UC Medications - No data to display   Initial Impression / Assessment and Plan / UC Course  I have reviewed the triage vital signs and the nursing notes.  Pertinent labs & imaging results that were available during my care of the patient were reviewed by me and considered in my medical decision making (see chart for details).    Given HIV status, prescription for azithromycin prescribed. Recommended symptom management as well with mucinex, anti-histamine, saline irrigation, and tylenol. Return if patient fails to improve with therapy or symptoms worsen significantly.    Final Clinical  Impressions(s) / UC Diagnoses   Final diagnoses:  Bacterial sinusitis    ED Discharge Orders        Ordered    azithromycin (ZITHROMAX Z-PAK) 250 MG tablet  Daily,   Status:  Discontinued     09/02/17 1537    azithromycin (ZITHROMAX Z-PAK) 250 MG tablet  Daily     09/02/17 1538       Controlled Substance Prescriptions [place] Controlled Substance Registry consulted? Not Applicable   Lew DawesWieters, Olivianna Higley C, [place]PA-C 09/02/17 [id number]

## 2017-09-02 NOTE — ED Triage Notes (Signed)
Pt reports generalized body aches, vomiting, fever, and congestion for the last 3-4 days.  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

## 2017-09-11 ENCOUNTER — Ambulatory Visit: Payer: Self-pay | Admitting: Infectious Disease

## 2017-10-24 ENCOUNTER — Other Ambulatory Visit: Payer: Self-pay

## 2017-10-31 ENCOUNTER — Ambulatory Visit: Payer: Self-pay

## 2017-11-06 ENCOUNTER — Ambulatory Visit: Payer: Self-pay | Admitting: Infectious Disease

## 2017-11-28 ENCOUNTER — Ambulatory Visit: Payer: Self-pay

## 2017-12-05 DIAGNOSIS — R05 Cough: Secondary | ICD-10-CM | POA: Insufficient documentation

## 2017-12-05 DIAGNOSIS — R0981 Nasal congestion: Secondary | ICD-10-CM | POA: Insufficient documentation

## 2017-12-05 DIAGNOSIS — Z5321 Procedure and treatment not carried out due to patient leaving prior to being seen by health care provider: Secondary | ICD-10-CM | POA: Insufficient documentation

## 2017-12-06 ENCOUNTER — Encounter (HOSPITAL_COMMUNITY): Payer: Self-pay

## 2017-12-06 ENCOUNTER — Emergency Department (HOSPITAL_COMMUNITY)
Admission: EM | Admit: 2017-12-06 | Discharge: 2017-12-06 | Discharge status: Against Medical Advice | Payer: Medicaid Other | Attending: Emergency Medicine | Admitting: Emergency Medicine

## 2017-12-06 ENCOUNTER — Emergency Department (HOSPITAL_COMMUNITY): Payer: Medicaid Other

## 2017-12-06 MED ORDER — ALBUTEROL SULFATE (2.5 MG/3ML) 0.083% IN NEBU
5.0000 mg | INHALATION_SOLUTION | Freq: Once | RESPIRATORY_TRACT | Status: AC
  Administered 2017-12-06: 5 mg via RESPIRATORY_TRACT
  Filled 2017-12-06: qty 6

## 2017-12-06 NOTE — ED Notes (Signed)
Pt complains of congestion and a cough, she states that she needs surgery on her septum but needs a referral to an ENT

## 2017-12-06 NOTE — ED Notes (Signed)
12/06/2017, follow-up call completed 

## 2017-12-07 ENCOUNTER — Ambulatory Visit (HOSPITAL_COMMUNITY)
Admission: EM | Admit: 2017-12-07 | Discharge: 2017-12-07 | Disposition: A | Discharge status: Home / Self Care | Payer: Self-pay | Attending: Emergency Medicine | Admitting: Emergency Medicine

## 2017-12-07 ENCOUNTER — Other Ambulatory Visit: Payer: Self-pay

## 2017-12-07 ENCOUNTER — Encounter (HOSPITAL_COMMUNITY): Payer: Self-pay

## 2017-12-07 DIAGNOSIS — Z21 Asymptomatic human immunodeficiency virus [HIV] infection status: Secondary | ICD-10-CM

## 2017-12-07 DIAGNOSIS — J019 Acute sinusitis, unspecified: Secondary | ICD-10-CM

## 2017-12-07 DIAGNOSIS — B20 Human immunodeficiency virus [HIV] disease: Secondary | ICD-10-CM

## 2017-12-07 MED ORDER — DOXYCYCLINE HYCLATE 100 MG PO CAPS: 100.0000 mg | ORAL_CAPSULE | Freq: Two times a day (BID) | ORAL | 0 refills | Status: AC

## 2017-12-07 MED ORDER — BICTEGRAVIR-EMTRICITAB-TENOFOV 50-200-25 MG PO TABS: 1.0000 | ORAL_TABLET | Freq: Every day | ORAL | 0 refills | Status: DC

## 2017-12-07 MED ORDER — PSEUDOEPH-BROMPHEN-DM 30-2-10 MG/5ML PO SYRP: 5.0000 mL | ORAL_SOLUTION | Freq: Four times a day (QID) | ORAL | 0 refills | Status: AC | PRN

## 2017-12-07 NOTE — ED Provider Notes (Signed)
[facility]    CSN: [id number] Arrival date & time: 12/07/17  1854     History   Chief Complaint Chief Complaint  Patient presents with  . Cough    HPI Rhonda Ayers is a 37 y.o. female history of HIV, transgender, asthma presenting today with concern about asthma and also difficulty breathing.  He has had some congestion, cough and feeling weak for over a week now.  States he went to [facility] a couple days ago and was given a breathing treatment which did not help him much.  He does not feel he is wheezing, but the congestion is what is bothering him.  Feels like he is having difficulty breathing out of his nose.  States he has had a lot of sputum production which is been different colors and is constantly coughing it up.  States he is also out of his HIV medicine.  Has follow-up on Wednesday with ID.  Denies fevers.  Denies chest pain.  Denies nausea, vomiting, diarrhea, abdominal pain.  HPI  Past Medical History:  Diagnosis Date  . Carpal tunnel syndrome 07/03/2016  . Depression 07/03/2016  . Health care maintenance 01/28/2017  . HIV infection (HCC)   . 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 Left leg pain 07/03/2016  . 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 Health care maintenance 01/28/2017  . Carpal tunnel syndrome 07/03/2016  . Left leg pain 07/03/2016  . Depression 07/03/2016  . 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 HIV disease (HCC) 01/31/2015  . Transgender 01/31/2015    History reviewed. No pertinent surgical history.     Home Medications    Prior to Admission medications   Medication Sig Start Date End Date Taking? Authorizing Provider  bictegravir-emtricitabine-tenofovir AF (BIKTARVY) 50-200-25 MG TABS tablet Take 1 tablet by mouth daily. 12/07/17   Cielle Aguila C, PA-C  brompheniramine-pseudoephedrine-DM 30-2-10 MG/5ML syrup Take 5 mLs by mouth 4 (four) times daily as needed for up to 5 days. 12/07/17 12/12/17  Jalonda Antigua,  Samnang Shugars C, PA-C  doxycycline (VIBRAMYCIN) 100 MG capsule Take 1 capsule (100 mg total) by mouth 2 (two) times daily for 10 days. 12/07/17 12/17/17  Trinitey Roache, Junius CreamerHallie C, PA-C    Family History Family History  Problem Relation Age of Onset  . Diabetes Mother     Social History Social History   Tobacco Use  . Smoking status: Light Tobacco Smoker    Packs/day: 0.10    Types: Cigarettes    Last attempt to quit: 12/30/2013    Years since quitting: 3.9  . Smokeless tobacco: Never Used  Substance Use Topics  . Alcohol use: Yes    Alcohol/week: 0.0 oz    Comment: occasional  . Drug use: No     Allergies   Motrin [ibuprofen] and Tramadol   Review of Systems Review of Systems  Constitutional: Positive for fatigue. Negative for activity change, appetite change and fever.  HENT: Positive for congestion, postnasal drip and rhinorrhea. Negative for ear pain, sinus pressure and sore throat.   Eyes: Negative for pain and itching.  Respiratory: Positive for cough and shortness of breath.   Cardiovascular: Negative for chest pain.  Gastrointestinal: Negative for abdominal pain, diarrhea, nausea and vomiting.  Musculoskeletal: Negative for myalgias.  Skin: Negative for rash.  Neurological: Positive for weakness. Negative for dizziness, light-headedness and headaches.     Physical Exam Triage Vital Signs ED Triage Vitals  Enc Vitals Group  BP 12/07/17 2019 116/71     Pulse Rate 12/07/17 2019 79     Resp 12/07/17 2019 18     Temp 12/07/17 2019 98.3 F (36.8 C)     Temp Source 12/07/17 2019 Oral     SpO2 12/07/17 2019 100 %     Weight --      Height --      Head Circumference --      Peak Flow --      Pain Score 12/07/17 2020 8     Pain Loc --      Pain Edu? --      Excl. in GC? --    No data found.  Updated Vital Signs BP 116/71 (BP Location: Left Arm)   Pulse 79   Temp 98.3 F (36.8 C) (Oral)   Resp 18   SpO2 100%   Visual Acuity Right Eye Distance:   Left Eye  Distance:   Bilateral Distance:    Right Eye Near:   Left Eye Near:    Bilateral Near:     Physical Exam  Constitutional: He appears well-developed and well-nourished.  HENT:  Head: Normocephalic and atraumatic.  Bilateral TMs without erythema, nasal mucosa erythematous, no rhinorrhea present.  Posterior oropharynx minimally erythematous, no tonsillar enlargement or exudate.  Eyes: Conjunctivae are normal.  Neck: Neck supple.  No cervical lymphadenopathy  Cardiovascular: Normal rate and regular rhythm.  No murmur heard. Pulmonary/Chest: Effort normal and breath sounds normal. No respiratory distress.  Breathing comfortably at rest, clear to auscultation bilaterally.  No wheezing or rales auscultated  Abdominal: Soft. There is no tenderness.  Musculoskeletal: He exhibits no edema.  Neurological: He is alert.  Skin: Skin is warm and dry.  Psychiatric: He has a normal mood and affect.  Nursing note and vitals reviewed.    UC Treatments / Results  Labs (all labs ordered are listed, but only abnormal results are displayed) Labs Reviewed - No data to display  EKG  EKG Interpretation None       Radiology Dg Chest 2 View  Result Date: 12/06/2017 CLINICAL DATA:  Cough and congestion for 1 week. EXAM: CHEST  2 VIEW COMPARISON:  Chest radiograph September 01, 2017 FINDINGS: Cardiomediastinal silhouette is normal. No pleural effusions or focal consolidations. Trachea projects midline and there is no pneumothorax. Soft tissue planes and included osseous structures are non-suspicious. Mild midthoracic dextroscoliosis. IMPRESSION: Negative. Electronically Signed   By: Awilda Metro M.D.   On: 12/06/2017 02:01    Procedures Procedures (including critical care time)  Medications Ordered in UC Medications - No data to display   Initial Impression / Assessment and Plan / UC Course  I have reviewed the triage vital signs and the nursing notes.  Pertinent labs & imaging results  that were available during my care of the patient were reviewed by me and considered in my medical decision making (see chart for details).     Patient with negative chest x-ray yesterday; patient with URI symptoms persisting for over a week.  Vital signs stable without fever, tachycardia, oxygen 100%.  Patient without wheezing do not feel this is an asthma exacerbation.  We will go ahead and treat with doxycycline for sinusitis given length of symptoms.  Also provided over-the-counter recommendations to better control congestion.  Provided cough syrup to use as needed for cough.  Also advised Delsym or Robitussin over-the-counter if more cost effective.    Patient out of his HIV medicine, flat has follow-up on  Wednesday of this week.  Will provide him 5 days to get him through to his appointment.  Discussed strict return precautions. Patient verbalized understanding and is agreeable with plan.   Final Clinical Impressions(s) / UC Diagnoses   Final diagnoses:  Acute sinusitis with symptoms > 10 days    ED Discharge Orders        Ordered    bictegravir-emtricitabine-tenofovir AF (BIKTARVY) 50-200-25 MG TABS tablet  Daily    Comments:  Patient has follow up appointment with ID on Wednesday, ran out today, providing enough to get him to appointment   12/07/17 2048    doxycycline (VIBRAMYCIN) 100 MG capsule  2 times daily     12/07/17 2050    brompheniramine-pseudoephedrine-DM 30-2-10 MG/5ML syrup  4 times daily PRN     12/07/17 2050       Controlled Substance Prescriptions [place] Controlled Substance Registry consulted? Not Applicable   Lew Dawes, [place] 12/07/17 2147

## 2017-12-07 NOTE — Discharge Instructions (Signed)
Please take doxycycline twice daily for the following 10 days.  For cough you may use cough syrup prescribed, you may use Delsym or Robitussin over-the-counter instead if more cost effective.  For congestion please start daily allergy pill like Zyrtec or Claritin, store brand is okay.  Please supplement with Flonase nasal spray or oral decongestant like Sudafed or Mucinex.  I have provided you with 5 days of your HIV medicines, please follow-up at your appointment scheduled for this week.

## 2017-12-07 NOTE — ED Triage Notes (Signed)
Pt presents today with productive cough, feeling weak, SOB, and chest tightness. Does have a hx of asthma and did just go [facility] and feels like they did not help him much.

## 2017-12-24 ENCOUNTER — Other Ambulatory Visit: Payer: Medicaid Other

## 2017-12-24 ENCOUNTER — Ambulatory Visit: Payer: Medicaid Other

## 2018-01-07 ENCOUNTER — Ambulatory Visit: Payer: Self-pay

## 2018-01-07 ENCOUNTER — Other Ambulatory Visit (HOSPITAL_COMMUNITY)
Admission: RE | Admit: 2018-01-07 | Discharge: 2018-01-07 | Disposition: A | Discharge status: Home / Self Care | Payer: Medicaid Other | Source: Ambulatory Visit | Attending: Infectious Disease | Admitting: Infectious Disease

## 2018-01-07 ENCOUNTER — Other Ambulatory Visit: Payer: Self-pay

## 2018-01-07 ENCOUNTER — Encounter: Payer: Self-pay | Admitting: Infectious Disease

## 2018-01-07 DIAGNOSIS — B20 Human immunodeficiency virus [HIV] disease: Secondary | ICD-10-CM

## 2018-01-07 DIAGNOSIS — Z23 Encounter for immunization: Secondary | ICD-10-CM

## 2018-01-07 DIAGNOSIS — Z113 Encounter for screening for infections with a predominantly sexual mode of transmission: Secondary | ICD-10-CM | POA: Diagnosis not present

## 2018-01-07 DIAGNOSIS — Z9119 Patient's noncompliance with other medical treatment and regimen: Secondary | ICD-10-CM

## 2018-01-07 DIAGNOSIS — Z91199 Patient's noncompliance with other medical treatment and regimen due to unspecified reason: Secondary | ICD-10-CM

## 2018-01-07 MED ORDER — BICTEGRAVIR-EMTRICITAB-TENOFOV 50-200-25 MG PO TABS: 1.0000 | ORAL_TABLET | Freq: Every day | ORAL | 0 refills | Status: DC

## 2018-01-08 LAB — COMPLETE METABOLIC PANEL WITH GFR
AG RATIO: 1.4 (calc) (ref 1.0–2.5)
ALBUMIN MSPROF: 4 g/dL (ref 3.6–5.1)
ALT: 16 U/L (ref 9–46)
AST: 29 U/L (ref 10–40)
Alkaline phosphatase (APISO): 67 U/L (ref 40–115)
BILIRUBIN TOTAL: 0.4 mg/dL (ref 0.2–1.2)
BUN: 12 mg/dL (ref 7–25)
CHLORIDE: 105 mmol/L (ref 98–110)
CO2: 29 mmol/L (ref 20–32)
Calcium: 9.2 mg/dL (ref 8.6–10.3)
Creat: 0.93 mg/dL (ref 0.60–1.35)
GFR, EST AFRICAN AMERICAN: 124 mL/min/{1.73_m2} (ref >=60)
GFR, Est Non African American: 107 mL/min/{1.73_m2} (ref >=60)
GLOBULIN: 2.8 g/dL (ref 1.9–3.7)
GLUCOSE: 91 mg/dL (ref 65–99)
Potassium: 4.1 mmol/L (ref 3.5–5.3)
SODIUM: 140 mmol/L (ref 135–146)
TOTAL PROTEIN: 6.8 g/dL (ref 6.1–8.1)

## 2018-01-08 LAB — CBC WITH DIFFERENTIAL/PLATELET
Basophils Absolute: 29 {cells}/uL (ref 0–200)
Basophils Relative: 0.7 %
Eosinophils Absolute: 258 {cells}/uL (ref 15–500)
Eosinophils Relative: 6.3 %
HCT: 49.9 % (ref 38.5–50.0)
Hemoglobin: 16.9 g/dL (ref 13.2–17.1)
Lymphs Abs: 2206 {cells}/uL (ref 850–3900)
MCH: 30 pg (ref 27.0–33.0)
MCHC: 33.9 g/dL (ref 32.0–36.0)
MCV: 88.6 fL (ref 80.0–100.0)
MPV: 10.6 fL (ref 7.5–12.5)
Monocytes Relative: 8.7 %
Neutro Abs: 1251 {cells}/uL — ABNORMAL LOW (ref 1500–7800)
Neutrophils Relative %: 30.5 %
Platelets: 210 Thousand/uL (ref 140–400)
RBC: 5.63 Million/uL (ref 4.20–5.80)
RDW: 12.6 % (ref 11.0–15.0)
Total Lymphocyte: 53.8 %
WBC mixed population: 357 {cells}/uL (ref 200–950)
WBC: 4.1 Thousand/uL (ref 3.8–10.8)

## 2018-01-08 LAB — URINE CYTOLOGY ANCILLARY ONLY
Chlamydia: NEGATIVE
Neisseria Gonorrhea: NEGATIVE

## 2018-01-08 LAB — MICROALBUMIN / CREATININE URINE RATIO
CREATININE, URINE: 209 mg/dL (ref 20–320)
MICROALB UR: 3.7 mg/dL
MICROALB/CREAT RATIO: 18 ug/mg{creat} (ref <30)

## 2018-01-08 LAB — RPR: RPR Ser Ql: REACTIVE — AB

## 2018-01-08 LAB — RPR TITER: RPR Titer: 1:2 {titer} — ABNORMAL HIGH

## 2018-01-08 LAB — T-HELPER CELL (CD4) - (RCID CLINIC ONLY)
CD4 T CELL HELPER: 44 % (ref 33–55)
CD4 T Cell Abs: 1050 /uL (ref 400–2700)

## 2018-01-08 LAB — FLUORESCENT TREPONEMAL AB(FTA)-IGG-BLD: Fluorescent Treponemal ABS: REACTIVE — AB

## 2018-01-10 ENCOUNTER — Telehealth: Payer: Self-pay | Admitting: Behavioral Health

## 2018-01-10 ENCOUNTER — Telehealth: Payer: Self-pay | Admitting: Infectious Disease

## 2018-01-10 DIAGNOSIS — B20 Human immunodeficiency virus [HIV] disease: Secondary | ICD-10-CM

## 2018-01-10 DIAGNOSIS — Z9119 Patient's noncompliance with other medical treatment and regimen: Secondary | ICD-10-CM

## 2018-01-10 DIAGNOSIS — Z91199 Patient's noncompliance with other medical treatment and regimen due to unspecified reason: Secondary | ICD-10-CM

## 2018-01-10 NOTE — Telephone Encounter (Signed)
Patient called and had questions about lab work.stating he was worried he had a sexually transmitted disease.  Patient RPR titer was 1:2 and trending down since October.  Patient states he will come to his appointment with Dr. Daiva EvesVan Dam on February 11 2018.  Patient verbalized understanding. Angeline SlimAshley Hill RN

## 2018-01-10 NOTE — Telephone Encounter (Signed)
I am worried that Rhonda Ayers may be failing with resistance. Her VL is above 2 K  Can we :  #1 add genotype including INSTI I ( I will put in order) And   #2 find out what has been going on with her meds. Has she been out of meds for some time or just become haphazard with missing doses every week?  If it is the latter we should have her stop her meds until we get her genotype back

## 2018-01-10 NOTE — Telephone Encounter (Signed)
Called patient to find out how she was taking her medications.  She states she was out of her Biktarvy for about 2.5 weeks so she was using an old Prescription of Atripla and breaking the pills in half until 01/07/2018 when she got a new prescription of Biktarvy. Pharmacy is gone for the day.  Writer advised her to continue her medication at this time and will follow up with her on Monday 01/14/2018. Angeline SlimAshley Hill RN

## 2018-01-13 NOTE — Telephone Encounter (Signed)
Called patient, verified identity, informed her per Dr. Daiva EvesVan Dam to stop taking Biktarvy until she is told further.  Let her know in the future is she runs out of medications to call the office and not to take old prescriptions or break any pills in half. Scheduled her an appointment with Pharmacy 01/20/2018.  Patient verbalized understanding. Angeline SlimAshley Hill RN

## 2018-01-13 NOTE — Telephone Encounter (Signed)
Excellent thank so much  Rhonda SheldonAshley

## 2018-01-13 NOTE — Telephone Encounter (Signed)
I think she should STOP the meds until we get resistance data. BREAKING PILLS IN HALF ESP ATRIPLA CAN BE DISASTROUS IT IS ALL OR NONE, Partial adherence will lead to resistance. Hopefully if it has only been 2 weeks we may have a less of a problem but I am worried she may have failed Atripla with R which has consequences for [facility] as a regimen

## 2018-01-20 ENCOUNTER — Ambulatory Visit (INDEPENDENT_AMBULATORY_CARE_PROVIDER_SITE_OTHER): Payer: Self-pay | Admitting: Pharmacist Clinician (PhC)/ Clinical Pharmacy Specialist

## 2018-01-20 DIAGNOSIS — B20 Human immunodeficiency virus [HIV] disease: Secondary | ICD-10-CM

## 2018-01-20 NOTE — Progress Notes (Signed)
HPI: Rhonda Ayers is a 37 y.o. female who is here to see pharmacy for her meds issue.   Allergies: Allergies  Allergen Reactions  . Motrin [Ibuprofen] Nausea And Vomiting  . Tramadol Other (See Comments)    "shakes"    Vitals:    Past Medical History: Past Medical History:  Diagnosis Date  . Carpal tunnel syndrome 07/03/2016  . Depression 07/03/2016  . Health care maintenance 01/28/2017  . HIV infection (HCC)   . 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 Left leg pain 07/03/2016  . 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    Social History: Social History   Socioeconomic History  . Marital status: Single    Spouse name: Not on file  . Number of children: Not on file  . Years of education: Not on file  . Highest education level: Not on file  Occupational History  . Not on file  Social Needs  . Financial resource strain: Not on file  . Food insecurity:    Worry: Not on file    Inability: Not on file  . Transportation needs:    Medical: Not on file    Non-medical: Not on file  Tobacco Use  . Smoking status: Light Tobacco Smoker    Packs/day: 0.10    Types: Cigarettes    Last attempt to quit: 12/30/2013    Years since quitting: 4.0  . Smokeless tobacco: Never Used  Substance and Sexual Activity  . Alcohol use: Yes    Alcohol/week: 0.0 oz    Comment: occasional  . Drug use: No  . Sexual activity: Never    Partners: Male    Birth control/protection: Condom    Comment: given condoms  Lifestyle  . Physical activity:    Days per week: Not on file    Minutes per session: Not on file  . Stress: Not on file  Relationships  . Social connections:    Talks on phone: Not on file    Gets together: Not on file    Attends religious service: Not on file    Active member of club or organization: Not on file    Attends meetings of clubs or organizations: Not on file    Relationship status: Not on file  Other Topics Concern  . Not on file  Social History Narrative  . Not on file    Previous  Regimen: Atripla, Triumeq  Current Regimen: Biktarvy  Labs: HIV 1 RNA Quant (copies/mL)  Date Value  01/07/2018 2,960 (H)  08/05/2017 123 (H)  07/03/2016 <20   CD4 T Cell Abs (/uL)  Date Value  01/07/2018 1,050  08/05/2017 810  01/28/2017 670   Hep B S Ab (no units)  Date Value  12/21/2014 NEG   Hepatitis B Surface Ag (no units)  Date Value  12/21/2014 NEGATIVE   HCV Ab (no units)  Date Value  12/21/2014 NEGATIVE    CrCl: CrCl cannot be calculated (Unknown ideal weight.).  Lipids:    Component Value Date/Time   CHOL 163 12/12/2016 1449   TRIG 141 12/12/2016 1449   HDL 46 12/12/2016 1449   CHOLHDL 3.5 12/12/2016 1449   VLDL 28 12/12/2016 1449   LDLCALC 89 12/12/2016 1449    Assessment: Special's ADAP was lapsed so she couldn't get Biktarvy. Subsequently, she split up the Atripla in half and took those. We may have got her the 30d supply of Bitarvy? [name] called several times to stop the [name].but apparently she still takes it. She became very angry when I  tried to find out more about the story and said that she expected to see Dr. Daiva Eves. She claimed to have various things like insurance through [place] and [place], [place], ect. We know that she doesn't have any of that. Her ADAP wasn't even submitted until late March, therefore, it's not where to be approved. We told her to stop her Biktarvy repeatedly because the labs are still not back. She refused to want to come back next week to figure out a new regimen because she has no coverage. She would like to be referred to all kind of services even though she doesn't have coverage. She decided to walk out today at the visit. I will not see her again. She has an appt with Dr. Daiva Eves at the end of April.   Recommendations:  F/u with Dr. Daiva Eves  Ulyses Southward, PharmD, BCPS, AAHIVP, CPP Clinical Infectious Disease Pharmacist [facility] 01/20/2018, 12:15 PM

## 2018-01-22 LAB — HIV-1 GENOTYPING (RTI,PI,IN INHBTR): HIV-1 Genotype: DETECTED — AB

## 2018-01-22 LAB — HIV-1 RNA QUANT-NO REFLEX-BLD
HIV 1 RNA Quant: 2960 copies/mL — ABNORMAL HIGH
HIV-1 RNA Quant, Log: 3.47 Log copies/mL — ABNORMAL HIGH

## 2018-01-22 LAB — TEST AUTHORIZATION

## 2018-01-30 ENCOUNTER — Ambulatory Visit: Payer: Medicaid Other | Admitting: Infectious Disease

## 2018-02-11 ENCOUNTER — Ambulatory Visit: Payer: Medicaid Other | Admitting: Infectious Disease

## 2018-06-10 ENCOUNTER — Ambulatory Visit: Payer: Medicaid Other

## 2018-06-10 ENCOUNTER — Other Ambulatory Visit: Payer: Self-pay | Admitting: Behavioral Health

## 2018-06-10 ENCOUNTER — Other Ambulatory Visit: Payer: Medicaid Other

## 2018-06-10 DIAGNOSIS — B20 Human immunodeficiency virus [HIV] disease: Secondary | ICD-10-CM

## 2018-06-11 ENCOUNTER — Other Ambulatory Visit: Payer: Self-pay

## 2018-06-11 ENCOUNTER — Other Ambulatory Visit: Payer: Self-pay | Admitting: Behavioral Health

## 2018-06-11 ENCOUNTER — Other Ambulatory Visit (HOSPITAL_COMMUNITY)
Admission: RE | Admit: 2018-06-11 | Discharge: 2018-06-11 | Disposition: A | Discharge status: Home / Self Care | Payer: Medicaid Other | Source: Ambulatory Visit | Attending: Infectious Disease | Admitting: Infectious Disease

## 2018-06-11 DIAGNOSIS — Z113 Encounter for screening for infections with a predominantly sexual mode of transmission: Secondary | ICD-10-CM

## 2018-06-11 DIAGNOSIS — B20 Human immunodeficiency virus [HIV] disease: Secondary | ICD-10-CM

## 2018-06-11 MED ORDER — BICTEGRAVIR-EMTRICITAB-TENOFOV 50-200-25 MG PO TABS: 1.0000 | ORAL_TABLET | Freq: Every day | ORAL | 0 refills | Status: DC

## 2018-06-13 LAB — CBC WITH DIFFERENTIAL/PLATELET
BASOS ABS: 40 {cells}/uL (ref 0–200)
Basophils Relative: 1.1 %
EOS ABS: 130 {cells}/uL (ref 15–500)
Eosinophils Relative: 3.6 %
HEMATOCRIT: 49.5 % (ref 38.5–50.0)
HEMOGLOBIN: 16.8 g/dL (ref 13.2–17.1)
LYMPHS ABS: 1836 {cells}/uL (ref 850–3900)
MCH: 29.3 pg (ref 27.0–33.0)
MCHC: 33.9 g/dL (ref 32.0–36.0)
MCV: 86.2 fL (ref 80.0–100.0)
MPV: 10.1 fL (ref 7.5–12.5)
Monocytes Relative: 11.7 %
NEUTROS ABS: 1174 {cells}/uL — AB (ref 1500–7800)
NEUTROS PCT: 32.6 %
Platelets: 220 10*3/uL (ref 140–400)
RBC: 5.74 10*6/uL (ref 4.20–5.80)
RDW: 12.9 % (ref 11.0–15.0)
Total Lymphocyte: 51 %
WBC mixed population: 421 cells/uL (ref 200–950)
WBC: 3.6 10*3/uL — ABNORMAL LOW (ref 3.8–10.8)

## 2018-06-13 LAB — RPR: RPR Ser Ql: REACTIVE — AB

## 2018-06-13 LAB — COMPREHENSIVE METABOLIC PANEL
AG Ratio: 1.2 (calc) (ref 1.0–2.5)
ALBUMIN MSPROF: 3.8 g/dL (ref 3.6–5.1)
ALT: 14 U/L (ref 9–46)
AST: 21 U/L (ref 10–40)
Alkaline phosphatase (APISO): 69 U/L (ref 40–115)
BUN: 13 mg/dL (ref 7–25)
CO2: 28 mmol/L (ref 20–32)
Calcium: 9.6 mg/dL (ref 8.6–10.3)
Chloride: 105 mmol/L (ref 98–110)
Creat: 1.06 mg/dL (ref 0.60–1.35)
GLUCOSE: 91 mg/dL (ref 65–99)
Globulin: 3.3 g/dL (calc) (ref 1.9–3.7)
POTASSIUM: 4.3 mmol/L (ref 3.5–5.3)
SODIUM: 140 mmol/L (ref 135–146)
TOTAL PROTEIN: 7.1 g/dL (ref 6.1–8.1)
Total Bilirubin: 0.3 mg/dL (ref 0.2–1.2)

## 2018-06-13 LAB — T-HELPER CELL (CD4) - (RCID CLINIC ONLY)
CD4 % Helper T Cell: 29 % — ABNORMAL LOW (ref 33–55)
CD4 T Cell Abs: 520 /uL (ref 400–2700)

## 2018-06-13 LAB — FLUORESCENT TREPONEMAL AB(FTA)-IGG-BLD: FLUORESCENT TREPONEMAL ABS: REACTIVE — AB

## 2018-06-13 LAB — URINE CYTOLOGY ANCILLARY ONLY
Chlamydia: NEGATIVE
Neisseria Gonorrhea: NEGATIVE

## 2018-06-13 LAB — HIV-1 RNA QUANT-NO REFLEX-BLD
HIV 1 RNA Quant: 80 copies/mL — ABNORMAL HIGH
HIV-1 RNA QUANT, LOG: 1.9 {Log_copies}/mL — AB

## 2018-06-13 LAB — RPR TITER

## 2018-06-17 ENCOUNTER — Telehealth: Payer: Self-pay | Admitting: *Deleted

## 2018-06-17 ENCOUNTER — Institutional Professional Consult (permissible substitution): Payer: Medicaid Other | Admitting: Licensed Clinical Social Worker

## 2018-06-17 NOTE — Telephone Encounter (Signed)
RN left generic message stating that lab results would be given at appointment next week, also that he needed to keep financial counseling appointment 9/10 in order to continue to receive medication. Andree Coss, RN

## 2018-06-17 NOTE — Telephone Encounter (Signed)
-----   Message from  Celedonio sent at 06/17/2018  9:42 AM EDT ----- Contact: [phone] Patient called requesting test results

## 2018-06-24 ENCOUNTER — Ambulatory Visit (INDEPENDENT_AMBULATORY_CARE_PROVIDER_SITE_OTHER): Payer: Medicaid Other | Admitting: Family

## 2018-06-24 ENCOUNTER — Encounter: Payer: Self-pay | Admitting: Family

## 2018-06-24 ENCOUNTER — Ambulatory Visit: Payer: Medicaid Other

## 2018-06-24 VITALS — BP 116/73 | HR 79 | Temp 97.6°F | Wt 207.8 lb

## 2018-06-24 DIAGNOSIS — F649 Gender identity disorder, unspecified: Secondary | ICD-10-CM

## 2018-06-24 DIAGNOSIS — B20 Human immunodeficiency virus [HIV] disease: Secondary | ICD-10-CM

## 2018-06-24 DIAGNOSIS — L84 Corns and callosities: Secondary | ICD-10-CM

## 2018-06-24 DIAGNOSIS — Z23 Encounter for immunization: Secondary | ICD-10-CM

## 2018-06-24 MED ORDER — BICTEGRAVIR-EMTRICITAB-TENOFOV 50-200-25 MG PO TABS: 1.0000 | ORAL_TABLET | Freq: Every day | ORAL | 1 refills | Status: DC

## 2018-06-24 NOTE — Assessment & Plan Note (Signed)
Rhonda Ayers has adequate control of her HIV disease, however continues to have a viral load of 30. I do have concern that she has been less than adherent in taking her medications as prescribed with multiple missed doses. Fortunately she has no evidence of opportunistic infection through history or physical exam. Prevnar and influenza updated today. Continue current dose of Biktarvy. She met with our financial counselors today to renew ADAP. She has a stable job at present. Encouraged the importance of taking her medication to reduce the risk of complications in the future. Follow up office visit in 2 months or sooner if needed.

## 2018-06-24 NOTE — Assessment & Plan Note (Signed)
Rhonda Ayers appears to have callouses on her feet with no evidence of ulceration or infection. These have been refractory to basic home care and shaving. No evidence of verruca seen. Recommend appropriate footwear and possibly use of a donut pad to take pressure off. Will refer to podiatry for further evaluation and treatment as needed.

## 2018-06-24 NOTE — Progress Notes (Signed)
Subjective:    Patient ID: Rhonda Ayers, female    DOB: April 11, 1981, 37 y.o.   MRN: [id number]  Chief Complaint  Patient presents with  . HIV Positive/AIDS     HPI:  Rhonda Ayers is a 37 y.o. female who presents today for routine follow up her HIV disease.  Rhonda Ayers was last seen in the clinic on 08/05/18 and restarted on [place] after her ADAP ran out and was not taking her Triumeq. Her most recent blood work shows a viral load of 80 and a CD4 count of 520. RPR titer of 1:1 which is down from previous. Kidney function, liver function and electrolytes are all normal. She is due for Prevnar and influenza vaccinations today.  Rhonda Ayers has been taking her Biktarvy since she received a refill. Prior to that she had not taken it in 5+ days. She has no adverse side effects when taking the medication. ADAP is approved through July 14, 2018. She is currently working as a Programme researcher, broadcasting/film/video at [employer]. She has concerns that there is something that is going on with her body and not sure what it is. She has calluses located on her bilateral feet that she is concerned may be related to HIV disease. Also describes weight fluctuations over the last 2-3 weeks. Denies fevers, chills, night sweats, headaches, changes in vision, neck pain/stiffness, nausea, diarrhea, vomiting, lesions or rashes.  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 She is interested in pursuing hormone therapy.  Allergies  Allergen Reactions  . Motrin [Ibuprofen] Nausea And Vomiting  . Tramadol Other (See Comments)    "shakes"      Outpatient Medications Prior to Visit  Medication Sig Dispense Refill  . bictegravir-emtricitabine-tenofovir AF (BIKTARVY) 50-200-25 MG TABS tablet Take 1 tablet by mouth daily. 30 tablet 0   No facility-administered  medications prior to visit.      Past Medical History:  Diagnosis Date  . Carpal tunnel syndrome 07/03/2016  . Depression 07/03/2016  . Health care maintenance 01/28/2017  . HIV infection ([place])   . 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 Left leg pain 07/03/2016  . 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 No pertinent surgical history.     Review of Systems  Constitutional: Negative for appetite change, chills, fatigue, fever and unexpected weight change.  Eyes: Negative for visual disturbance.  Respiratory: Negative for cough, chest tightness, shortness of breath and wheezing.   Cardiovascular: Negative for chest pain and leg swelling.  Gastrointestinal: Negative for abdominal pain, constipation, diarrhea, nausea and vomiting.  Genitourinary: Negative for dysuria, flank pain, frequency, genital sores, hematuria and urgency.  Skin: Negative for rash.  Allergic/Immunologic: Negative for immunocompromised state.  Neurological: Negative for dizziness and headaches.      Objective:    BP 116/73   Pulse 79   Temp 97.6 F (36.4 C)   Wt 207 lb 12.8 oz (94.3 kg)   BMI 33.54 kg/m  Nursing note and vital signs reviewed.  Physical Exam  Constitutional: He is oriented to person, place, and time. He appears well-developed. No distress.  HENT:  Mouth/Throat: Oropharynx is clear and moist.  Eyes: Conjunctivae are normal.  Neck: Neck supple.  Cardiovascular: Normal rate, regular rhythm, normal heart sounds and intact distal pulses. Exam reveals no gallop and no friction rub.  No murmur heard. Pulmonary/Chest: Effort normal and breath sounds normal. No respiratory distress. He has no wheezes. He has no rales. He exhibits no tenderness.  Abdominal: Soft. Bowel sounds are normal. There is no tenderness.  Lymphadenopathy:    He has no cervical adenopathy.  Neurological: He is alert and oriented to person, place, and time.  Skin: Skin is warm and dry. No rash noted.  Bilateral feet with  multiple callused areas. There does not appear to be any seeding of the sites. They are firm and tender to the touch. No odor or drainage present.   Psychiatric: He has a normal mood and affect. His behavior is normal. Judgment and thought content normal.       Assessment & Plan:   Problem List Items Addressed This Visit      Other   HIV disease ([place]) - Primary    Rhonda Ayers has adequate control of her HIV disease, however continues to have a viral load of 30. I do have concern that she has been less than adherent in taking her medications as prescribed with multiple missed doses. Fortunately she has no evidence of opportunistic infection through history or physical exam. Prevnar and influenza updated today. Continue current dose of Biktarvy. She met with our financial counselors today to renew ADAP. She has a stable job at present. Encouraged the importance of taking her medication to reduce the risk of complications in the future. Follow up office visit in 2 months or sooner if needed.       Relevant Medications   bictegravir-emtricitabine-tenofovir AF (BIKTARVY) 50-200-25 MG TABS tablet   Other Relevant Orders   Pneumococcal conjugate vaccine 13-valent IM (Completed)   Callus of foot    Rhonda Ayers appears to have callouses on her feet with no evidence of ulceration or infection. These have been refractory to basic home care and shaving. No evidence of verruca seen. Recommend appropriate footwear and possibly use of a donut pad to take pressure off. Will refer to podiatry for further evaluation and treatment as needed.       Gender identity disorder    Rhonda Ayers is a transgender female who has been previously counseled and diagnosed with gender incongruence. She is interested in seeking hormone therapy to help with the conversion. We briefly discussed the hormones involved with additional information provided in the after visit summary.        Other Visit Diagnoses    Need for  pneumococcal vaccination       Relevant Orders   Pneumococcal conjugate vaccine 13-valent IM (Completed)   Need for immunization against influenza       Relevant Orders   Flu Vaccine QUAD 36+ mos IM (Completed)       I am having Riannah D. Ebarb "Special" maintain his bictegravir-emtricitabine-tenofovir AF.    Follow-up: Return in about 2 months (around 08/24/2018), or if symptoms worsen or fail to improve.   Terri Piedra, MSN, FNP-C Nurse Practitioner [facility] Office phone: [phone] Pager: [place] number: [phone]

## 2018-06-24 NOTE — Assessment & Plan Note (Signed)
Rhonda Ayers is a transgender female who has been previously counseled and diagnosed with gender incongruence. She is interested in seeking hormone therapy to help with the conversion. We briefly discussed the hormones involved with additional information provided in the after visit summary.

## 2018-06-24 NOTE — Patient Instructions (Signed)
Nice to meet you.  Please continue to take your [place] as prescribed.  Your ADAP is approved through the end of September, if you are running low before new is approved let us know.  Information on hormones is below.  Please plan to follow up in 2 months or sooner if needed.   Spironolactone tablets What is this medicine? SPIRONOLACTONE (speer on [place] LAK tone) is a diuretic. It helps you make more urine and to lose excess water from your body. This medicine is used to treat high blood pressure, and edema or swelling from heart, kidney, or liver disease. It is also used to treat patients who make too much aldosterone or have low potassium. This medicine may be used for other purposes; ask your health care provider or pharmacist if you have questions. COMMON BRAND NAME(S): Aldactone What should I tell my health care provider before I take this medicine? They need to know if you have any of these conditions: -high blood level of potassium -kidney disease or trouble making urine -liver disease -an unusual or allergic reaction to spironolactone, other medicines, foods, dyes, or preservatives -pregnant or trying to get pregnant -breast-feeding How should I use this medicine? Take this medicine by mouth with a drink of water. Follow the directions on your prescription label. You can take it with or without food. If it upsets your stomach, take it with food. Do not take your medicine more often than directed. Remember that you will need to pass more urine after taking this medicine. Do not take your doses at a time of day that will cause you problems. Do not take at bedtime. Talk to your pediatrician regarding the use of this medicine in children. While this drug may be prescribed for selected conditions, precautions do apply. Overdosage: If you think you have taken too much of this medicine contact a poison control center or emergency room at once. NOTE: This medicine is only for you. Do not  share this medicine with others. What if I miss a dose? If you miss a dose, take it as soon as you can. If it is almost time for your next dose, take only that dose. Do not take double or extra doses. What may interact with this medicine? Do not take this medicine with any of the following medications: -eplerenone This medicine may also interact with the following medications: -corticosteroids -digoxin -lithium -medicines for high blood pressure like ACE inhibitors -skeletal muscle relaxants like tubocurarine -NSAIDs, medicines for pain and inflammation, like ibuprofen or naproxen -potassium products like salt substitute or supplements -pressor amines like norepinephrine -some diuretics This list may not describe all possible interactions. Give your health care provider a list of all the medicines, herbs, non-prescription drugs, or dietary supplements you use. Also tell them if you smoke, drink alcohol, or use illegal drugs. Some items may interact with your medicine. What should I watch for while using this medicine? Visit your doctor or health care professional for regular checks on your progress. Check your blood pressure as directed. Ask your doctor what your blood pressure should be, and when you should contact them. You may need to be on a special diet while taking this medicine. Ask your doctor. Also, ask how many glasses of fluid you need to drink a day. You must not get dehydrated. This medicine may make you feel confused, dizzy or lightheaded. Drinking alcohol and taking some medicines can make this worse. Do not drive, use machinery, or do anything that needs  mental alertness until you know how this medicine affects you. Do not sit or stand up quickly. What side effects may I notice from receiving this medicine? Side effects that you should report to your doctor or health care professional as soon as possible: -allergic reactions such as skin rash or itching, hives, swelling of the  lips, mouth, tongue, or throat -black or tarry stools -fast, irregular heartbeat -fever -muscle pain, cramps -numbness, tingling in hands or feet -trouble breathing -trouble passing urine -unusual bleeding -unusually weak or tired Side effects that usually do not require medical attention (report to your doctor or health care professional if they continue or are bothersome): -change in voice or hair growth -confusion -dizzy, drowsy -dry mouth, increased thirst -enlarged or tender breasts -headache -irregular menstrual periods -sexual difficulty, unable to have an erection -stomach upset This list may not describe all possible side effects. Call your doctor for medical advice about side effects. You may report side effects to FDA at 1-800-FDA-1088. Where should I keep my medicine? Keep out of the reach of children. Store below 25 degrees C (77 degrees F). Throw away any unused medicine after the expiration date. NOTE: This sheet is a summary. It may not cover all possible information. If you have questions about this medicine, talk to your doctor, pharmacist, or health care provider.  2018 Elsevier/Gold Standard (2010-06-13 12:51:30)  Estradiol tablets What is this medicine? ESTRADIOL (es tra DYE ole) is an estrogen. It is mostly used as hormone replacement in menopausal women. It helps to treat hot flashes and prevent osteoporosis. It is also used to treat women with low estrogen levels or those who have had their ovaries removed. This medicine may be used for other purposes; ask your health care provider or pharmacist if you have questions. COMMON BRAND NAME(S): Estrace, Femtrace, Gynodiol What should I tell my health care provider before I take this medicine? They need to know if you have or ever had any of these conditions: -abnormal vaginal bleeding -blood vessel disease or blood clots -breast, cervical, endometrial, ovarian, liver, or uterine  cancer -dementia -diabetes -gallbladder disease -heart disease or recent heart attack -high blood pressure -high cholesterol -high level of calcium in the blood -hysterectomy -kidney disease -liver disease -migraine headaches -protein C deficiency -protein S deficiency -stroke -systemic lupus erythematosus (SLE) -tobacco smoker -an unusual or allergic reaction to estrogens, other hormones, medicines, foods, dyes, or preservatives -pregnant or trying to get pregnant -breast-feeding How should I use this medicine? Take this medicine by mouth. To reduce nausea, this medicine may be taken with food. Follow the directions on the prescription label. Take this medicine at the same time each day and in the order directed on the package. Do not take your medicine more often than directed. Contact your pediatrician regarding the use of this medicine in children. Special care may be needed. A patient package insert for the product will be given with each prescription and refill. Read this sheet carefully each time. The sheet may change frequently. Overdosage: If you think you have taken too much of this medicine contact a poison control center or emergency room at once. NOTE: This medicine is only for you. Do not share this medicine with others. What if I miss a dose? If you miss a dose, take it as soon as you can. If it is almost time for your next dose, take only that dose. Do not take double or extra doses. What may interact with this medicine? Do not take  this medicine with any of the following medications: -aromatase inhibitors like aminoglutethimide, anastrozole, exemestane, letrozole, testolactone This medicine may also interact with the following medications: -carbamazepine -certain antibiotics used to treat infections -certain barbiturates or benzodiazepines used for inducing sleep or treating seizures -grapefruit juice -medicines for fungus infections like itraconazole and  ketoconazole -raloxifene or tamoxifen -rifabutin, rifampin, or rifapentine -ritonavir -St. John's Wort -warfarin This list may not describe all possible interactions. Give your health care provider a list of all the medicines, herbs, non-prescription drugs, or dietary supplements you use. Also tell them if you smoke, drink alcohol, or use illegal drugs. Some items may interact with your medicine. What should I watch for while using this medicine? Visit your doctor or health care professional for regular checks on your progress. You will need a regular breast and pelvic exam and Pap smear while on this medicine. You should also discuss the need for regular mammograms with your health care professional, and follow his or her guidelines for these tests. This medicine can make your body retain fluid, making your fingers, hands, or ankles swell. Your blood pressure can go up. Contact your doctor or health care professional if you feel you are retaining fluid. If you have any reason to think you are pregnant, stop taking this medicine right away and contact your doctor or health care professional. Smoking increases the risk of getting a blood clot or having a stroke while you are taking this medicine, especially if you are more than 35 years old. You are strongly advised not to smoke. If you wear contact lenses and notice visual changes, or if the lenses begin to feel uncomfortable, consult your eye doctor or health care professional. This medicine can increase the risk of developing a condition (endometrial hyperplasia) that may lead to cancer of the lining of the uterus. Taking progestins, another hormone drug, with this medicine lowers the risk of developing this condition. Therefore, if your uterus has not been removed (by a hysterectomy), your doctor may prescribe a progestin for you to take together with your estrogen. You should know, however, that taking estrogens with progestins may have additional  health risks. You should discuss the use of estrogens and progestins with your health care professional to determine the benefits and risks for you. If you are going to have surgery, you may need to stop taking this medicine. Consult your health care professional for advice before you schedule the surgery. What side effects may I notice from receiving this medicine? Side effects that you should report to your doctor or health care professional as soon as possible: -allergic reactions like skin rash, itching or hives, swelling of the face, lips, or tongue -breast tissue changes or discharge -changes in vision -chest pain -confusion, trouble speaking or understanding -dark urine -general ill feeling or flu-like symptoms -light-colored stools -nausea, vomiting -pain, swelling, warmth in the leg -right upper belly pain -severe headaches -shortness of breath -sudden numbness or weakness of the face, arm or leg -trouble walking, dizziness, loss of balance or coordination -unusual vaginal bleeding -yellowing of the eyes or skin Side effects that usually do not require medical attention (report to your doctor or health care professional if they continue or are bothersome): -hair loss -increased hunger or thirst -increased urination -symptoms of vaginal infection like itching, irritation or unusual discharge -unusually weak or tired This list may not describe all possible side effects. Call your doctor for medical advice about side effects. You may report side effects to FDA  at 1-800-FDA-1088. Where should I keep my medicine? Keep out of the reach of children. Store at room temperature between 20 and 25 degrees C (68 and 77 degrees F). Keep container tightly closed. Protect from light. Throw away any unused medicine after the expiration date. NOTE: This sheet is a summary. It may not cover all possible information. If you have questions about this medicine, talk to your doctor, pharmacist, or  health care provider.  2018 Elsevier/Gold Standard (2011-01-03 09:19:24)    Sickle Cell Anemia, Adult Sickle cell anemia is a condition in which red blood cells have an abnormal "sickle" shape. This abnormal shape shortens the cells' life span, which results in a lower than normal concentration of red blood cells in the blood. The sickle shape also causes the cells to clump together and block free blood flow through the blood vessels. As a result, the tissues and organs of the body do not receive enough oxygen. Sickle cell anemia causes organ damage and pain and increases the risk of infection. What are the causes? Sickle cell anemia is a genetic disorder. Those who receive two copies of the gene have the condition, and those who receive one copy have the trait. What increases the risk? The sickle cell gene is most common in people whose families originated in [place]. Other areas of the globe where sickle cell trait occurs include the [place], [place], the [place], and the [place]. What are the signs or symptoms?  Pain, especially in the extremities, back, chest, or abdomen (common). The pain may start suddenly or may develop following an illness, especially if there is dehydration. Pain can also occur due to overexertion or exposure to extreme temperature changes.  Frequent severe bacterial infections, especially certain types of pneumonia and meningitis.  Pain and swelling in the hands and feet.  Decreased activity.  Loss of appetite.  Change in behavior.  Headaches.  Seizures.  Shortness of breath or difficulty breathing.  Vision changes.  Skin ulcers. Those with the trait may not have symptoms or they may have mild symptoms. How is this diagnosed? Sickle cell anemia is diagnosed with blood tests that demonstrate the genetic trait. It is often diagnosed during the newborn period, due to mandatory testing nationwide. A variety of blood tests,  X-rays, CT scans, MRI scans, ultrasounds, and lung function tests may also be done to monitor the condition. How is this treated? Sickle cell anemia may be treated with:  Medicines. You may be given pain medicines, antibiotic medicines (to treat and prevent infections) or medicines to increase the production of certain types of hemoglobin.  Fluids.  Oxygen.  Blood transfusions.  Follow these instructions at home:  Drink enough fluid to keep your urine clear or pale yellow. Increase your fluid intake in hot weather and during exercise.  Do not smoke. Smoking lowers oxygen levels in the blood.  Only take over-the-counter or prescription medicines for pain, fever, or discomfort as directed by your health care provider.  Take antibiotics as directed by your health care provider. Make sure you finish them it even if you start to feel better.  Take supplements as directed by your health care provider.  Consider wearing a medical alert bracelet. This tells anyone caring for you in an emergency of your condition.  When traveling, keep your medical information, health care provider's names, and the medicines you take with you at all times.  If you develop a fever, do not take medicines to reduce the fever  right away. This could cover up a problem that is developing. Notify your health care provider.  Keep all follow-up appointments with your health care provider. Sickle cell anemia requires regular medical care. Contact a health care provider if: You have a fever. Get help right away if:  You feel dizzy or faint.  You have new abdominal pain, especially on the left side near the stomach area.  You develop a persistent, often uncomfortable and painful penile erection (priapism). If this is not treated immediately it will lead to impotence.  You have numbness your arms or legs or you have a hard time moving them.  You have a hard time with speech.  You have a fever or persistent  symptoms for more than 2-3 days.  You have a fever and your symptoms suddenly get worse.  You have signs or symptoms of infection. These include: ? Chills. ? Abnormal tiredness (lethargy). ? Irritability. ? Poor eating. ? Vomiting.  You develop pain that is not helped with medicine.  You develop shortness of breath.  You have pain in your chest.  You are coughing up pus-like or bloody sputum.  You develop a stiff neck.  Your feet or hands swell or have pain.  Your abdomen appears bloated.  You develop joint pain. This information is not intended to replace advice given to you by your health care provider. Make sure you discuss any questions you have with your health care provider. Document Released: 01/09/2006 Document Revised: 04/20/2016 Document Reviewed: 05/13/2013 Elsevier Interactive Patient Education  2017 [employer].

## 2018-06-25 ENCOUNTER — Institutional Professional Consult (permissible substitution): Payer: Medicaid Other | Admitting: Licensed Clinical Social Worker

## 2018-07-02 ENCOUNTER — Telehealth: Payer: Self-pay | Admitting: *Deleted

## 2018-07-02 NOTE — Telephone Encounter (Signed)
RN returned call, left message saying hormones will be prescribed at next visit, that [facility]. Andree CossHowell, Adalee Kathan M, RN

## 2018-07-02 NOTE — Telephone Encounter (Signed)
-----   Message from Johaura Celedonio sent at 07/02/2018  2:53 PM EDT ----- Contact: [phone] Called with questions on regards to medication refill, and questions on regards to hormone pills

## 2018-07-03 NOTE — Telephone Encounter (Signed)
Patient left message at front desk, asked to speak with Clinical research associatewriter. RN returned the call, went to voicemail.  Left message stating Rhonda Ayers from your doctor's office is returning your call.

## 2018-07-04 NOTE — Telephone Encounter (Signed)
RN attempted to call for the 3rd time, call went to voicemail.

## 2018-07-05 ENCOUNTER — Encounter (HOSPITAL_COMMUNITY): Payer: Self-pay | Admitting: Emergency Medicine

## 2018-07-05 ENCOUNTER — Emergency Department (HOSPITAL_COMMUNITY)
Admission: EM | Admit: 2018-07-05 | Discharge: 2018-07-05 | Disposition: A | Discharge status: Home / Self Care | Payer: Medicaid Other | Attending: Emergency Medicine | Admitting: Emergency Medicine

## 2018-07-05 ENCOUNTER — Other Ambulatory Visit: Payer: Self-pay

## 2018-07-05 DIAGNOSIS — R0981 Nasal congestion: Secondary | ICD-10-CM | POA: Insufficient documentation

## 2018-07-05 DIAGNOSIS — R0989 Other specified symptoms and signs involving the circulatory and respiratory systems: Secondary | ICD-10-CM | POA: Insufficient documentation

## 2018-07-05 DIAGNOSIS — R05 Cough: Secondary | ICD-10-CM | POA: Insufficient documentation

## 2018-07-05 DIAGNOSIS — Z5321 Procedure and treatment not carried out due to patient leaving prior to being seen by health care provider: Secondary | ICD-10-CM | POA: Insufficient documentation

## 2018-07-05 NOTE — ED Triage Notes (Signed)
C/o productive cough with green sputum, nasal congestion, and runny nose since Friday morning.  States he got his pneumonia and flu shots last week and has felt bad since then.

## 2018-07-05 NOTE — ED Notes (Signed)
Called patient to retake Vital Signs and there were no answer.

## 2018-07-05 NOTE — ED Notes (Signed)
No answer from pt I waiting room

## 2018-07-15 ENCOUNTER — Telehealth: Payer: Self-pay | Admitting: *Deleted

## 2018-07-15 ENCOUNTER — Telehealth: Payer: Self-pay

## 2018-07-15 NOTE — Telephone Encounter (Signed)
Patient called today with questions regarding medication and referral to podiatry. Patient states she has been off medication for a week now. Has tried to contact [facility] who states they do not have any refills. Last refill was sent on 9/10 with one additional refill. Advise patient try to call pharmacy one more time and if a new prescription is needed ask pharmacy to send a refill request.  Patient also has concerns with her feet. States she addressed concerns with Marcos Eke, Np during last visit. A referral to podiatry was supposed to be sent, however, do not see a referral.  Will route message to Marcos Eke, Np to have a referral to podiatry for patient.  Lorenso Courier, [place]

## 2018-07-15 NOTE — Telephone Encounter (Signed)
Patient left message at front stating she was unable to get medication at pharmacy. Per Olegario Messier, patient never brought paystubs for ADAP application.  RN attempted to call patient.  Voicemail full on mobile number, left message asking her to call Marcelino Duster on work number. RN sent [employer] as well. Andree Coss, RN

## 2018-07-16 ENCOUNTER — Ambulatory Visit: Payer: Medicaid Other

## 2018-07-16 ENCOUNTER — Other Ambulatory Visit: Payer: Self-pay | Admitting: *Deleted

## 2018-07-16 DIAGNOSIS — B20 Human immunodeficiency virus [HIV] disease: Secondary | ICD-10-CM

## 2018-07-16 MED ORDER — BICTEGRAVIR-EMTRICITAB-TENOFOV 50-200-25 MG PO TABS: 1.0000 | ORAL_TABLET | Freq: Every day | ORAL | 5 refills | Status: DC

## 2018-07-17 ENCOUNTER — Encounter: Payer: Self-pay | Admitting: Infectious Disease

## 2018-07-31 ENCOUNTER — Telehealth: Payer: Self-pay | Admitting: Behavioral Health

## 2018-07-31 ENCOUNTER — Ambulatory Visit (HOSPITAL_COMMUNITY)
Admission: EM | Admit: 2018-07-31 | Discharge: 2018-07-31 | Disposition: A | Discharge status: Home / Self Care | Payer: Medicaid Other | Attending: Family Medicine | Admitting: Family Medicine

## 2018-07-31 ENCOUNTER — Encounter (HOSPITAL_COMMUNITY): Payer: Self-pay | Admitting: Emergency Medicine

## 2018-07-31 ENCOUNTER — Other Ambulatory Visit: Payer: Self-pay

## 2018-07-31 DIAGNOSIS — L84 Corns and callosities: Secondary | ICD-10-CM

## 2018-07-31 DIAGNOSIS — M79671 Pain in right foot: Secondary | ICD-10-CM

## 2018-07-31 DIAGNOSIS — B20 Human immunodeficiency virus [HIV] disease: Secondary | ICD-10-CM

## 2018-07-31 MED ORDER — MUPIROCIN CALCIUM 2 % EX CREA: 1.0000 "application " | TOPICAL_CREAM | Freq: Two times a day (BID) | CUTANEOUS | 0 refills | Status: DC

## 2018-07-31 NOTE — Telephone Encounter (Signed)
Patient came in today is a walk in.  Patient states he feet are not getting better and states Tammy Sours would put in a podiatry referral.   Angeline Slim RN

## 2018-07-31 NOTE — ED Provider Notes (Signed)
[facility]   [id number] 07/31/18 Arrival Time: 1500  CC: Calluses  SUBJECTIVE:  Rhonda Ayers is a 37 y.o. female hx significant for HIV, who presents with multiple calluses for the past few months.  Denies a precipitating factor, but states he has wide feet and has difficulty with finding shoes to fit properly.  Localizes the calluses to bilateral feet.  Describes it as painful.  Has tried wrapping his feet without relief.  Symptoms are made worse with walking.   Denies similar symptoms in the past.   Denies fever, chills, nausea, vomiting, erythema, redness, swollen glands, SOB, chest pain, abdominal pain, changes in bowel or bladder function.    ROS: As per HPI.  Past Medical History:  Diagnosis Date  . Carpal tunnel syndrome 07/03/2016  . Depression 07/03/2016  . Health care maintenance 01/28/2017  . HIV infection (HCC)   . 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 Left leg pain 07/03/2016  . 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 No pertinent surgical history. Allergies  Allergen Reactions  . Motrin [Ibuprofen] Nausea And Vomiting  . Tramadol Other (See Comments)    "shakes"   No current facility-administered medications on file prior to encounter.    Current Outpatient Medications on File Prior to Encounter  Medication Sig Dispense Refill  . bictegravir-emtricitabine-tenofovir AF (BIKTARVY) 50-200-25 MG TABS tablet Take 1 tablet by mouth daily. 30 tablet 5   Social History   Socioeconomic History  . Marital status: Single    Spouse name: Not on file  . Number of children: Not on file  . Years of education: Not on file  . Highest education level: Not on file  Occupational History  . Not on file  Social Needs  . Financial resource strain: Not on file  . Food insecurity:    Worry: Not on file    Inability: Not on file  . Transportation needs:    Medical: Not on file    Non-medical: Not on file  Tobacco Use  . Smoking status: Light Tobacco Smoker   Packs/day: 0.10    Types: Cigarettes    Last attempt to quit: 12/30/2013    Years since quitting: 4.5  . Smokeless tobacco: Never Used  Substance and Sexual Activity  . Alcohol use: Yes    Alcohol/week: 0.0 standard drinks    Comment: occasional  . Drug use: No  . Sexual activity: Never    Partners: Male    Birth control/protection: Condom    Comment: given condoms  Lifestyle  . Physical activity:    Days per week: Not on file    Minutes per session: Not on file  . Stress: Not on file  Relationships  . Social connections:    Talks on phone: Not on file    Gets together: Not on file    Attends religious service: Not on file    Active member of club or organization: Not on file    Attends meetings of clubs or organizations: Not on file    Relationship status: Not on file  . Intimate partner violence:    Fear of current or ex partner: Not on file    Emotionally abused: Not on file    Physically abused: Not on file    Forced sexual activity: Not on file  Other Topics Concern  . Not on file  Social History Narrative  . Not on file   Family History  Problem Relation Age of Onset  . Diabetes Mother  OBJECTIVE: Vitals:   07/31/18 1514  BP: 130/72  Pulse: (!) 104  Resp: 20  Temp: 98.1 F (36.7 C)  TempSrc: Oral  SpO2: 100%    General appearance: alert; no distress Lungs: clear to auscultation bilaterally Heart: regular rate and rhythm.  Radial pulse 2+ bilaterally Extremities: no edema Skin: warm and dry; thickened skin localized to plantar aspect of right foot approximately 2 cm in diameter; mildly tender to palpation; no obvious erythema; thickened skin also appreciated to lateral aspect of bilateral fifth toes Psychological: alert and cooperative; normal mood and affect  PROCEDURE: Verbal consent obtained.  15 blade scalpel used to debulked callus on plantar aspect of right foot.  Patient tolerated procedure well.  Minimal bleeding.  Bandage applied.     ASSESSMENT & PLAN:  1. Foot callus   2. HIV disease (HCC)     Meds ordered this encounter  Medications  . mupirocin cream (BACTROBAN) 2 %    Sig: Apply 1 application topically 2 (two) times daily.    Dispense:  15 g    Refill:  0    Order Specific Question:   Supervising Provider    Answer:   Isa Rankin [[id number]]    Callus debulked Avoid wearing ill-fitting shoes Keep dry and covered Apply bactroban twice daily to avoid secondary infection Follow up with podiatrist for further evaluation and management of chronic calluses Return or go to the ED if you have any new or worsening symptoms    Reviewed expectations re: course of current medical issues. Questions answered. Outlined signs and symptoms indicating need for more acute intervention. Patient verbalized understanding. After Visit Summary given.   Rennis Harding, PA-C 07/31/18 1711

## 2018-07-31 NOTE — ED Triage Notes (Signed)
Reports bilateral foot calluses.  Patient wants a referral for podiatry

## 2018-07-31 NOTE — Discharge Instructions (Addendum)
Callus debulked Avoid wearing ill-fitting shoes Keep dry and covered Apply bactroban twice daily to avoid secondary infection Follow up with podiatrist for further evaluation and management of chronic calluses Return or go to the ED if you have any new or worsening symptoms

## 2018-08-01 ENCOUNTER — Other Ambulatory Visit: Payer: Self-pay | Admitting: Family

## 2018-08-01 DIAGNOSIS — L84 Corns and callosities: Secondary | ICD-10-CM

## 2018-08-01 NOTE — Telephone Encounter (Signed)
Referral placed.

## 2018-08-04 ENCOUNTER — Other Ambulatory Visit (HOSPITAL_COMMUNITY)
Admission: RE | Admit: 2018-08-04 | Discharge: 2018-08-04 | Disposition: A | Discharge status: Home / Self Care | Payer: Medicaid Other | Source: Ambulatory Visit | Attending: Infectious Diseases | Admitting: Infectious Diseases

## 2018-08-04 ENCOUNTER — Ambulatory Visit (INDEPENDENT_AMBULATORY_CARE_PROVIDER_SITE_OTHER): Payer: Medicaid Other | Admitting: Infectious Diseases

## 2018-08-04 ENCOUNTER — Encounter: Payer: Self-pay | Admitting: Infectious Diseases

## 2018-08-04 VITALS — BP 116/70 | HR 78 | Temp 98.6°F | Wt 211.0 lb

## 2018-08-04 DIAGNOSIS — B359 Dermatophytosis, unspecified: Secondary | ICD-10-CM

## 2018-08-04 DIAGNOSIS — Z79899 Other long term (current) drug therapy: Secondary | ICD-10-CM | POA: Insufficient documentation

## 2018-08-04 DIAGNOSIS — Z113 Encounter for screening for infections with a predominantly sexual mode of transmission: Secondary | ICD-10-CM | POA: Diagnosis present

## 2018-08-04 DIAGNOSIS — L84 Corns and callosities: Secondary | ICD-10-CM | POA: Diagnosis not present

## 2018-08-04 DIAGNOSIS — B20 Human immunodeficiency virus [HIV] disease: Secondary | ICD-10-CM | POA: Insufficient documentation

## 2018-08-04 MED ORDER — TERBINAFINE HCL 250 MG PO TABS: 250.0000 mg | ORAL_TABLET | Freq: Every day | ORAL | 0 refills | Status: DC

## 2018-08-04 NOTE — Assessment & Plan Note (Signed)
Will get labs today States he has gotten flu and pnvx already.

## 2018-08-04 NOTE — Progress Notes (Signed)
   Subjective:    Patient ID: Rhonda Ayers, female    DOB: Feb 22, 1981, 37 y.o.   MRN: [id number]  HPI 37 yo M with HIV+, on triumeq --> biktarvy (08-05-17).  Works at [employer] as a [employer] and is worried about a rash on his face. Present for 3 days. Not sexually active.  He also wants to seen by podiatry for multiple calluses on his feet. He has tried to be seen at [facility] but is uninsured.  Also has questions about hormone therapy.  He has f/u appt with Dr Daiva Eves on 09-01-18.    HIV 1 RNA Quant (copies/mL)  Date Value  06/11/2018 80 (H)  01/07/2018 2,960 (H)  08/05/2017 123 (H)   CD4 T Cell Abs (/uL)  Date Value  06/11/2018 520  01/07/2018 1,050  08/05/2017 810    Review of Systems  Constitutional: Negative for appetite change, chills, fever and unexpected weight change.  Gastrointestinal: Negative for constipation and diarrhea.  Genitourinary: Negative for difficulty urinating.  Skin: Positive for rash.  Please see HPI. All other systems reviewed and negative.     Objective:   Physical Exam  Constitutional: He appears well-developed and well-nourished.  HENT:  Head: Normocephalic and atraumatic.  Mouth/Throat: No oropharyngeal exudate.  Eyes: Pupils are equal, round, and reactive to light. EOM are normal.  Neck: Normal range of motion. Neck supple.  Cardiovascular: Normal rate, regular rhythm and normal heart sounds.  Pulmonary/Chest: Effort normal and breath sounds normal.  Abdominal: Soft. Bowel sounds are normal. He exhibits no distension. There is no tenderness.  Musculoskeletal: He exhibits no edema.  Feet:  Right Foot:  Skin Integrity: Positive for callus (on mid sole. ~ 3 cm x 2 cm).  Left Foot:  Skin Integrity: Positive for callus (2 calluses. rouhgly 1 x 1 cm. under mid foot and 1st MT head. ).  Lymphadenopathy:    He has no cervical adenopathy.      Assessment & Plan:

## 2018-08-04 NOTE — Assessment & Plan Note (Signed)
Will give him 10 days of terbinafine.  He will f/u with Dr Daiva Eves.

## 2018-08-04 NOTE — Assessment & Plan Note (Signed)
He is working on getting apt at [employer] (needs to pay upfront co-pay).

## 2018-08-05 LAB — URINE CYTOLOGY ANCILLARY ONLY
Chlamydia: NEGATIVE
Neisseria Gonorrhea: NEGATIVE

## 2018-08-06 LAB — RPR: RPR: REACTIVE — AB

## 2018-08-06 LAB — LIPID PANEL
Cholesterol: 189 mg/dL (ref <200)
HDL: 37 mg/dL — AB (ref >40)
LDL CHOLESTEROL (CALC): 126 mg/dL — AB
NON-HDL CHOLESTEROL (CALC): 152 mg/dL — AB (ref <130)
TRIGLYCERIDES: 143 mg/dL (ref <150)
Total CHOL/HDL Ratio: 5.1 (calc) — ABNORMAL HIGH (ref <5.0)

## 2018-08-06 LAB — T-HELPER CELL (CD4) - (RCID CLINIC ONLY)
CD4 T CELL HELPER: 33 % (ref 33–55)
CD4 T Cell Abs: 660 /uL (ref 400–2700)

## 2018-08-06 LAB — COMPREHENSIVE METABOLIC PANEL
AG Ratio: 1.3 (calc) (ref 1.0–2.5)
ALBUMIN MSPROF: 4.2 g/dL (ref 3.6–5.1)
ALT: 23 U/L (ref 9–46)
AST: 53 U/L — ABNORMAL HIGH (ref 10–40)
Alkaline phosphatase (APISO): 61 U/L (ref 40–115)
BUN: 12 mg/dL (ref 7–25)
CALCIUM: 9.6 mg/dL (ref 8.6–10.3)
CO2: 30 mmol/L (ref 20–32)
CREATININE: 0.98 mg/dL (ref 0.60–1.35)
Chloride: 101 mmol/L (ref 98–110)
GLUCOSE: 77 mg/dL (ref 65–99)
Globulin: 3.2 g/dL (calc) (ref 1.9–3.7)
Potassium: 4.1 mmol/L (ref 3.5–5.3)
SODIUM: 137 mmol/L (ref 135–146)
TOTAL PROTEIN: 7.4 g/dL (ref 6.1–8.1)
Total Bilirubin: 0.5 mg/dL (ref 0.2–1.2)

## 2018-08-06 LAB — HIV-1 RNA QUANT-NO REFLEX-BLD
HIV 1 RNA Quant: 451 copies/mL — ABNORMAL HIGH
HIV-1 RNA QUANT, LOG: 2.65 {Log_copies}/mL — AB

## 2018-08-06 LAB — CBC
HCT: 49.6 % (ref 38.5–50.0)
Hemoglobin: 16.8 g/dL (ref 13.2–17.1)
MCH: 28.5 pg (ref 27.0–33.0)
MCHC: 33.9 g/dL (ref 32.0–36.0)
MCV: 84.1 fL (ref 80.0–100.0)
MPV: 10.5 fL (ref 7.5–12.5)
PLATELETS: 208 10*3/uL (ref 140–400)
RBC: 5.9 10*6/uL — ABNORMAL HIGH (ref 4.20–5.80)
RDW: 12.8 % (ref 11.0–15.0)
WBC: 4 10*3/uL (ref 3.8–10.8)

## 2018-08-06 LAB — RPR TITER: RPR Titer: 1:2 {titer} — ABNORMAL HIGH

## 2018-08-06 LAB — FLUORESCENT TREPONEMAL AB(FTA)-IGG-BLD: FLUORESCENT TREPONEMAL ABS: REACTIVE — AB

## 2018-08-07 ENCOUNTER — Other Ambulatory Visit: Payer: Self-pay | Admitting: Infectious Diseases

## 2018-08-07 ENCOUNTER — Telehealth: Payer: Self-pay

## 2018-08-07 DIAGNOSIS — B359 Dermatophytosis, unspecified: Secondary | ICD-10-CM

## 2018-08-07 MED ORDER — FLUCONAZOLE 100 MG PO TABS: 100.0000 mg | ORAL_TABLET | Freq: Every day | ORAL | 0 refills | Status: DC

## 2018-08-07 NOTE — Progress Notes (Signed)
Phone note addressed Pt can fill fluconazole for 28 days or can work with pharmacy to get terbinafine filled.  flucon should be covered under adap

## 2018-08-07 NOTE — Telephone Encounter (Signed)
Please see note- will change him to fluconazole 100mg  daily for 28 days or if he would like he can get terbinafine while working with our pharmacy for access.

## 2018-08-07 NOTE — Telephone Encounter (Addendum)
Patient called office stating he was unable to pick up his prescription terbinafine due to it costing $34. Patient would like alternative medication that is covered under insurance called into pharmacy. Patient states he is displeased with the office, and how he feels like he was not given the best service. Would like a call back ASAP once new prescription is sent in.  Lorenso Courier, [place]

## 2018-08-12 ENCOUNTER — Ambulatory Visit: Payer: Self-pay | Admitting: Sports Medicine

## 2018-08-15 ENCOUNTER — Telehealth: Payer: Self-pay | Admitting: *Deleted

## 2018-08-15 DIAGNOSIS — B359 Dermatophytosis, unspecified: Secondary | ICD-10-CM

## 2018-08-15 MED ORDER — FLUCONAZOLE 100 MG PO TABS: 100.0000 mg | ORAL_TABLET | Freq: Every day | ORAL | 0 refills | Status: AC

## 2018-08-15 NOTE — Telephone Encounter (Signed)
Patient called to update her phone number, find out if there was an approved alternative to the lamisil.  Per chart, Dr Ninetta Lights changed this to Diflucan (set to Print).  RN confirmed #[phone], sent electronically to [facility] for patient to pick up.  Patient reviewed lab results, is detectable. She states she had been missing doses prior to the appointment, but has not missed a dose since that visit. She follows up with Dr Daiva Eves 11/18.  Andree Coss, RN

## 2018-08-18 ENCOUNTER — Emergency Department (HOSPITAL_COMMUNITY)
Admission: EM | Admit: 2018-08-18 | Discharge: 2018-08-19 | Disposition: A | Discharge status: Home / Self Care | Payer: No Typology Code available for payment source | Attending: Emergency Medicine | Admitting: Emergency Medicine

## 2018-08-18 DIAGNOSIS — W228XXA Striking against or struck by other objects, initial encounter: Secondary | ICD-10-CM | POA: Insufficient documentation

## 2018-08-18 DIAGNOSIS — Z23 Encounter for immunization: Secondary | ICD-10-CM | POA: Diagnosis not present

## 2018-08-18 DIAGNOSIS — S61411A Laceration without foreign body of right hand, initial encounter: Secondary | ICD-10-CM | POA: Diagnosis not present

## 2018-08-18 DIAGNOSIS — F17210 Nicotine dependence, cigarettes, uncomplicated: Secondary | ICD-10-CM | POA: Diagnosis not present

## 2018-08-18 DIAGNOSIS — Y999 Unspecified external cause status: Secondary | ICD-10-CM | POA: Diagnosis not present

## 2018-08-18 DIAGNOSIS — Y9389 Activity, other specified: Secondary | ICD-10-CM | POA: Insufficient documentation

## 2018-08-18 DIAGNOSIS — S6991XA Unspecified injury of right wrist, hand and finger(s), initial encounter: Secondary | ICD-10-CM | POA: Diagnosis present

## 2018-08-18 DIAGNOSIS — Z79899 Other long term (current) drug therapy: Secondary | ICD-10-CM | POA: Insufficient documentation

## 2018-08-18 DIAGNOSIS — Y929 Unspecified place or not applicable: Secondary | ICD-10-CM | POA: Insufficient documentation

## 2018-08-19 ENCOUNTER — Other Ambulatory Visit: Payer: Self-pay

## 2018-08-19 ENCOUNTER — Emergency Department (HOSPITAL_COMMUNITY): Payer: No Typology Code available for payment source

## 2018-08-19 ENCOUNTER — Encounter (HOSPITAL_COMMUNITY): Payer: Self-pay | Admitting: Emergency Medicine

## 2018-08-19 MED ORDER — TETANUS-DIPHTH-ACELL PERTUSSIS 5-2.5-18.5 LF-MCG/0.5 IM SUSP
0.5000 mL | Freq: Once | INTRAMUSCULAR | Status: AC
  Administered 2018-08-19: 0.5 mL via INTRAMUSCULAR
  Filled 2018-08-19: qty 0.5

## 2018-08-19 MED ORDER — LIDOCAINE HCL (PF) 1 % IJ SOLN
5.0000 mL | Freq: Once | INTRAMUSCULAR | Status: AC
  Administered 2018-08-19: 5 mL
  Filled 2018-08-19: qty 5

## 2018-08-19 MED ORDER — BACITRACIN ZINC 500 UNIT/GM EX OINT: 1.0000 "application " | TOPICAL_OINTMENT | Freq: Two times a day (BID) | CUTANEOUS | 0 refills | Status: DC

## 2018-08-19 MED ORDER — ACETAMINOPHEN 500 MG PO TABS
1000.0000 mg | ORAL_TABLET | Freq: Once | ORAL | Status: AC
  Administered 2018-08-19: 1000 mg via ORAL
  Filled 2018-08-19: qty 2

## 2018-08-19 NOTE — ED Provider Notes (Signed)
[facility] EMERGENCY DEPARTMENT Provider Note   CSN: [id number] Arrival date & time: 08/18/18  2347     History   Chief Complaint Chief Complaint  Patient presents with  . Hand Injury    HPI Rhonda Ayers is a 37 y.o. female.   37 year old female with hx of HIV (CD4 count 660) presents to the emergency department for evaluation of a laceration to his right hand.  He states that he slipped on a piece of bread at work while holding a tray of cups.  States that he cut his hand on the tray.  He denies use of chronic anticoagulants.  Bleeding controlled in triage.  Reports subjective decreased sensation along the radial lateral aspect of the digit.  Has preserved range of motion.  The history is provided by the patient. No language interpreter was used.  Hand Injury      Past Medical History:  Diagnosis Date  . Carpal tunnel syndrome 07/03/2016  . Depression 07/03/2016  . Health care maintenance 01/28/2017  . HIV infection (HCC)   . 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 Left leg pain 07/03/2016  . 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 Ringworm 08/04/2018  . Callus of foot 06/24/2018  . 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 Health care maintenance 01/28/2017  . Carpal tunnel syndrome 07/03/2016  . Left leg pain 07/03/2016  . Depression 07/03/2016  . 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 HIV disease (HCC) 01/31/2015  . Transgender 01/31/2015    History reviewed. No pertinent surgical history.      Home Medications    Prior to Admission medications   Medication Sig Start Date End Date Taking? Authorizing Provider  bacitracin ointment Apply 1 application topically 2 (two) times daily. 08/19/18   Antony Madura, PA-C  bictegravir-emtricitabine-tenofovir AF (BIKTARVY) 50-200-25 MG TABS tablet Take 1 tablet by mouth daily. 07/16/18   Veryl Speak, FNP  fluconazole (DIFLUCAN) 100 MG tablet Take 1 tablet (100 mg total) by mouth  daily for 14 days. 08/15/18 08/29/18  Ginnie Smart, MD  mupirocin cream (BACTROBAN) 2 % Apply 1 application topically 2 (two) times daily. 07/31/18   Rennis Harding, PA-C    Family History Family History  Problem Relation Age of Onset  . Diabetes Mother     Social History Social History   Tobacco Use  . Smoking status: Light Tobacco Smoker    Packs/day: 0.10    Types: Cigarettes    Last attempt to quit: 12/30/2013    Years since quitting: 4.6  . Smokeless tobacco: Never Used  Substance Use Topics  . Alcohol use: Yes    Alcohol/week: 0.0 standard drinks    Comment: occasional  . Drug use: No     Allergies   Motrin [ibuprofen] and Tramadol   Review of Systems Review of Systems Ten systems reviewed and are negative for acute change, except as noted in the HPI.    Physical Exam Updated Vital Signs BP 129/87 (BP Location: Left Arm)   Pulse 93   Temp 98 F (36.7 C) (Oral)   Resp 18   SpO2 100%   Physical Exam  Constitutional: He is oriented to person, place, and time. He appears well-developed and well-nourished. No distress.  Nontoxic appearing and in NAD  HENT:  Head: Normocephalic and atraumatic.  Eyes: Conjunctivae and EOM are normal. No scleral icterus.  Neck: Normal range of motion.  Pulmonary/Chest: Effort normal. No respiratory distress.  Respirations even and unlabored  Musculoskeletal: Normal range of motion.       Right hand: He exhibits tenderness and laceration. He exhibits no bony tenderness, no deformity and no swelling.       Hands: Laceration along the radial aspect of the R index finger as well as over the DIP of the thumb. Bleeding controlled.  Neurological: He is alert and oriented to person, place, and time. He exhibits normal muscle tone. Coordination normal.  Reports subjective decreased sensation along the radial aspect of his right index finger  Skin: Skin is warm and dry. No rash noted. He is not diaphoretic. No erythema. No  pallor.  Psychiatric: He has a normal mood and affect. His behavior is normal.  Nursing note and vitals reviewed.    ED Treatments / Results  Labs (all labs ordered are listed, but only abnormal results are displayed) Labs Reviewed - No data to display  EKG None  Radiology Dg Hand Complete Right  Result Date: 08/19/2018 CLINICAL DATA:  Injury to right hand. Fall. Laceration. Initial encounter. EXAM: RIGHT HAND - COMPLETE 3+ VIEW COMPARISON:  None. FINDINGS: Bandage material overlies the fingers. There is no evidence of acute fracture or dislocation. No radiopaque foreign body is identified. IMPRESSION: No evidence of acute osseous abnormality or radiopaque foreign body. Electronically Signed   By: Sebastian Ache M.D.   On: 08/19/2018 01:24    Procedures Procedures (including critical care time)  LACERATION REPAIR Performed by: Antony Madura Authorized by: Antony Madura Consent: Verbal consent obtained. Risks and benefits: risks, benefits and alternatives were discussed Consent given by: patient Patient identity confirmed: provided demographic data Prepped and Draped in normal sterile fashion Wound explored  Laceration Location: R index finger  Laceration Length: 3cm  No Foreign Bodies seen or palpated  Anesthesia: local infiltration  Local anesthetic: lidocaine 1% without epinephrine  Anesthetic total: 4 ml  Irrigation method: syringe Amount of cleaning: standard  Skin closure: 4-0 ethilon  Number of sutures: 5  Technique: simple interrupted  Patient tolerance: Patient tolerated the procedure well with no immediate complications.   LACERATION REPAIR Performed by: Antony Madura Authorized by: Antony Madura Consent: Verbal consent obtained. Risks and benefits: risks, benefits and alternatives were discussed Consent given by: patient Patient identity confirmed: provided demographic data Prepped and Draped in normal sterile fashion Wound explored  Laceration  Location: R thumb  Laceration Length: 1cm  No Foreign Bodies seen or palpated  Anesthesia: local infiltration  Local anesthetic: lidocaine 1% without epinephrine  Anesthetic total: 1 ml  Irrigation method: syringe Amount of cleaning: standard  Skin closure: 4-0 ethilon  Number of sutures: 1  Technique: horizontal mattress  Patient tolerance: Patient tolerated the procedure well with no immediate complications.   Medications Ordered in ED Medications  Tdap (BOOSTRIX) injection 0.5 mL (0.5 mLs Intramuscular Given 08/19/18 0332)  lidocaine (PF) (XYLOCAINE) 1 % injection 5 mL (5 mLs Infiltration Given 08/19/18 0332)     Initial Impression / Assessment and Plan / ED Course  I have reviewed the triage vital signs and the nursing notes.  Pertinent labs & imaging results that were available during my care of the patient were reviewed by me and considered in my medical decision making (see chart for details).     Tdap booster given. Laceration occurred < 8 hours prior to repair which was well tolerated. Patient has no comorbidities to effect normal wound healing. Discussed suture home care with patient and answered questions. Pt to f-u for wound  check and suture removal in 10 days. Pt is hemodynamically stable with no complaints prior to discharge.     Final Clinical Impressions(s) / ED Diagnoses   Final diagnoses:  Laceration of right hand without foreign body, initial encounter    ED Discharge Orders         Ordered    bacitracin ointment  2 times daily     08/19/18 0435           Antony Madura, PA-C 08/19/18 0435    Ward, Layla Maw, DO 08/19/18 [id number]

## 2018-08-19 NOTE — ED Triage Notes (Signed)
Pt reports that he slipped on a piece of bread at work and fell while holding a container. Pt has a laceration to the base of the right index finger. Bleeding controlled at this time.  ROM present. Gauzed dressing applied in triage.

## 2018-08-22 ENCOUNTER — Encounter (HOSPITAL_COMMUNITY): Payer: Self-pay | Admitting: *Deleted

## 2018-08-22 ENCOUNTER — Emergency Department (HOSPITAL_COMMUNITY)
Admission: EM | Admit: 2018-08-22 | Discharge: 2018-08-22 | Disposition: A | Discharge status: Home / Self Care | Payer: No Typology Code available for payment source | Attending: Emergency Medicine | Admitting: Emergency Medicine

## 2018-08-22 DIAGNOSIS — R42 Dizziness and giddiness: Secondary | ICD-10-CM | POA: Diagnosis not present

## 2018-08-22 DIAGNOSIS — S61411D Laceration without foreign body of right hand, subsequent encounter: Secondary | ICD-10-CM | POA: Diagnosis not present

## 2018-08-22 DIAGNOSIS — F0781 Postconcussional syndrome: Secondary | ICD-10-CM

## 2018-08-22 DIAGNOSIS — Z5189 Encounter for other specified aftercare: Secondary | ICD-10-CM | POA: Insufficient documentation

## 2018-08-22 DIAGNOSIS — F17210 Nicotine dependence, cigarettes, uncomplicated: Secondary | ICD-10-CM | POA: Insufficient documentation

## 2018-08-22 DIAGNOSIS — Z79899 Other long term (current) drug therapy: Secondary | ICD-10-CM | POA: Insufficient documentation

## 2018-08-22 DIAGNOSIS — W25XXXA Contact with sharp glass, initial encounter: Secondary | ICD-10-CM | POA: Diagnosis not present

## 2018-08-22 MED ORDER — DOXYCYCLINE HYCLATE 100 MG PO CAPS: 100.0000 mg | ORAL_CAPSULE | Freq: Two times a day (BID) | ORAL | 0 refills | Status: DC

## 2018-08-22 MED ORDER — DOXYCYCLINE HYCLATE 100 MG PO TABS
100.0000 mg | ORAL_TABLET | Freq: Once | ORAL | Status: AC
  Administered 2018-08-22: 100 mg via ORAL
  Filled 2018-08-22: qty 1

## 2018-08-22 NOTE — ED Triage Notes (Signed)
Pt is here to have her right hand rechecked.  She had a laceration on 11/4 which was sutured.  Pt reports that she continues to have pain in right hand which radiates up right arm.  Pt also reports continued HA and dizziness.  Pt is alert and oriented.

## 2018-08-22 NOTE — ED Notes (Signed)
Patient able to ambulate independently  

## 2018-08-22 NOTE — Discharge Instructions (Addendum)
Please go directly to your appointment with your hand surgeon.   You may return to the emergency department if you have persistent symptoms of severe headaches, dizziness/lightheadedness, vision changes, numbness or weakness, vomiting or seizure activity.

## 2018-08-22 NOTE — ED Provider Notes (Signed)
[facility] EMERGENCY DEPARTMENT Provider Note   CSN: [id number] Arrival date & time: 08/22/18  1024     History   Chief Complaint Chief Complaint  Patient presents with  . Wound Check    HPI Rhonda Ayers is a 37 y.o. female.  HPI  Patient is a 37 year old transgender female with a h/o HIV (CD4 660) who presents the emergency department for multiple complaints.  Patient presenting to have a wound recheck of the right hand.  She states she had a fall at work and cut her hand on glass 3 days ago.  Since then she has had significant pain to the area that shoots up her arm.  States any movement causes severe pain of her right finger.  She denies any drainage or obvious redness from the wound.  No fevers or chills.  Has been trying to take Tylenol and Advil at home with no significant relief.  States she has been keeping the hand elevated and icing it.  States she just found out prior to arrival that she has an appointment with a hand surgeon at 1 PM.  She is also complaining of an episode of dizziness which occurred prior to arrival.  She states that she hit her head when she fell several days ago.  She did not have an episode of dizziness until today.  Episode is since resolved.  The episode was associated with some blurred vision.  It lasted for seconds and resolved after sitting down.  No severe headaches, vomiting, numbness or weakness to the arms or legs.  Currently asymptomatic.   Past Medical History:  Diagnosis Date  . Carpal tunnel syndrome 07/03/2016  . Depression 07/03/2016  . Health care maintenance 01/28/2017  . HIV infection (HCC)   . 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 Left leg pain 07/03/2016  . 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 Ringworm 08/04/2018  . Callus of foot 06/24/2018  . 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 Health care maintenance 01/28/2017  . Carpal tunnel syndrome 07/03/2016  . Left leg pain  07/03/2016  . Depression 07/03/2016  . 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 HIV disease (HCC) 01/31/2015  . Transgender 01/31/2015    History reviewed. No pertinent surgical history.      Home Medications    Prior to Admission medications   Medication Sig Start Date End Date Taking? Authorizing Provider  bacitracin ointment Apply 1 application topically 2 (two) times daily. 08/19/18   Antony Madura, PA-C  bictegravir-emtricitabine-tenofovir AF (BIKTARVY) 50-200-25 MG TABS tablet Take 1 tablet by mouth daily. 07/16/18   Veryl Speak, FNP  doxycycline (VIBRAMYCIN) 100 MG capsule Take 1 capsule (100 mg total) by mouth 2 (two) times daily. 08/22/18   Murl Golladay S, PA-C  fluconazole (DIFLUCAN) 100 MG tablet Take 1 tablet (100 mg total) by mouth daily for 14 days. 08/15/18 08/29/18  Ginnie Smart, MD  mupirocin cream (BACTROBAN) 2 % Apply 1 application topically 2 (two) times daily. 07/31/18   Rennis Harding, PA-C    Family History Family History  Problem Relation Age of Onset  . Diabetes Mother     Social History Social History   Tobacco Use  . Smoking status: Light Tobacco Smoker    Packs/day: 0.10    Types: Cigarettes    Last attempt to quit: 12/30/2013    Years since quitting: 4.6  . Smokeless tobacco: Never Used  Substance Use Topics  .  Alcohol use: Yes    Alcohol/week: 0.0 standard drinks    Comment: occasional  . Drug use: No     Allergies   Motrin [ibuprofen] and Tramadol   Review of Systems Review of Systems  Constitutional: Negative for chills and fever.  HENT: Negative for congestion and rhinorrhea.   Eyes: Positive for visual disturbance (resolved).  Respiratory: Negative for cough and shortness of breath.   Cardiovascular: Negative for chest pain.  Gastrointestinal: Negative for abdominal pain, constipation, diarrhea, nausea and vomiting.  Genitourinary: Negative for flank pain.  Musculoskeletal: Negative for back pain.  Skin: Negative for rash.    Neurological: Positive for dizziness (resolved). Negative for weakness, light-headedness, numbness and headaches.   Physical Exam Updated Vital Signs BP 113/76 (BP Location: Right Arm)   Pulse 74   Temp 97.7 F (36.5 C) (Oral)   Resp 16   SpO2 100%   Physical Exam  Constitutional: He is oriented to person, place, and time. He appears well-developed and well-nourished.  HENT:  Head: Normocephalic and atraumatic.  Eyes: Pupils are equal, round, and reactive to light. Conjunctivae and EOM are normal.  No nystagmus  Neck: Neck supple.  Cardiovascular: Normal rate, regular rhythm and normal heart sounds.  No murmur heard. Pulmonary/Chest: Effort normal and breath sounds normal. No respiratory distress. He has no wheezes.  Abdominal: Soft. There is no tenderness.  Musculoskeletal: He exhibits no edema.  Sutures intact to right hand. Wounds are well healing. No drainage present. There is TTP along the right index finger. There is some mild swelling, but no fusiform swelling to the finger. Pain is present with flexion and extension. Brisk cap refill. Sensation is somewhat decreased to the right index finger. Some warmth to the dorsum of the right hand without obvious erythema.  Neurological: He is alert and oriented to person, place, and time. No cranial nerve deficit.  Mental Status:  Alert, thought content appropriate, able to give a coherent history. Speech fluent without evidence of aphasia. Able to follow 2 step commands without difficulty.  Motor:  Normal tone. 5/5 strength of BUE and BLE major muscle groups including strong and dorsiflexion/plantar flexion, pt will not participate in grip strength secondary to pain Sensory: light touch normal in all extremities. Cerebellar: normal finger-to-nose with bilateral upper extremities Gait: normal gait and balance.  Skin: Skin is warm and dry.  Psychiatric: He has a normal mood and affect.  Nursing note and vitals reviewed.  ED  Treatments / Results  Labs (all labs ordered are listed, but only abnormal results are displayed) Labs Reviewed - No data to display  EKG None  Radiology No results found.  Procedures Procedures (including critical care time)  Medications Ordered in ED Medications  doxycycline (VIBRA-TABS) tablet 100 mg (100 mg Oral Given 08/22/18 1115)     Initial Impression / Assessment and Plan / ED Course  I have reviewed the triage vital signs and the nursing notes.  Pertinent labs & imaging results that were available during my care of the patient were reviewed by me and considered in my medical decision making (see chart for details).      Final Clinical Impressions(s) / ED Diagnoses   Final diagnoses:  Visit for wound check  Post concussive syndrome   Patient presenting with complaints of persistent pain to his right hand wounds that were repaired with sutures 3 days ago.  He has had no drainage from the wound.   Mild swelling to the right index finger but no  significant fusiform swelling present.  There is some warmth to the dorsum of the hand but no obvious erythema.  Patient has pain with both flexion and extension of the right index finger  Due to the patient's risk of developing an infection given HIV status, gave dose of doxycycline in the ED and will give Rx for this.  Considered flexor tenosynovitis however no fusiform swelling present, and this seems less likely.  Patient has had no fevers or chills.  She is very well-appearing.  She has an appointment with hand surgery in 2 hours, I discussed the importance of going to this appointment as she may be developing an infection and it would be important to be evaluated by hand surgery.  She voices an understanding of the importance to follow-up and agrees to do so after leaving the ED.  She also reported a short episode of dizziness that occurred with activity earlier today.  She reports recent head trauma when her hand injury occurred  3 days ago.  No vomiting, severe headaches, numbness, weakness or persistent symptoms.  Her symptoms have resolved on arrival to the ED and her neurologic exam is completely normal.  Discussed that I think her symptoms are related to postconcussive syndrome.  She did not have imaging on her original visit and I Offered to do a head CT, however discussed that performing a head CT would likely make her late for her hand surgery appointment which I think it is important for her to go to.  She prefers to defer head CT at this time and follow-up with hand surgery.  I advised that if her symptoms continue or are worrisome to her then she may return to the emergency department for further evaluation.  She voices an understanding of the plan and reasons to return.  All questions answered.  ED Discharge Orders         Ordered    doxycycline (VIBRAMYCIN) 100 MG capsule  2 times daily     08/22/18 1111           Karrie Meres, PA-C 08/22/18 1127    Maia Plan, MD 08/22/18 2033

## 2018-08-22 NOTE — ED Notes (Signed)
Pt reports that he has been taking "advil" at home.  No allergic reactions were noted when taking this. Chart reflects allergy to Motrin, this may need to be re-evaluated in the future.

## 2018-08-30 ENCOUNTER — Emergency Department (HOSPITAL_COMMUNITY)
Admission: EM | Admit: 2018-08-30 | Discharge: 2018-08-30 | Disposition: A | Discharge status: Home / Self Care | Payer: No Typology Code available for payment source | Attending: Emergency Medicine | Admitting: Emergency Medicine

## 2018-08-30 ENCOUNTER — Other Ambulatory Visit: Payer: Self-pay

## 2018-08-30 ENCOUNTER — Encounter (HOSPITAL_COMMUNITY): Payer: Self-pay | Admitting: Emergency Medicine

## 2018-08-30 DIAGNOSIS — M79641 Pain in right hand: Secondary | ICD-10-CM

## 2018-08-30 DIAGNOSIS — Z21 Asymptomatic human immunodeficiency virus [HIV] infection status: Secondary | ICD-10-CM | POA: Diagnosis not present

## 2018-08-30 DIAGNOSIS — X58XXXD Exposure to other specified factors, subsequent encounter: Secondary | ICD-10-CM | POA: Diagnosis not present

## 2018-08-30 DIAGNOSIS — Z79899 Other long term (current) drug therapy: Secondary | ICD-10-CM | POA: Diagnosis not present

## 2018-08-30 DIAGNOSIS — S61411D Laceration without foreign body of right hand, subsequent encounter: Secondary | ICD-10-CM | POA: Insufficient documentation

## 2018-08-30 DIAGNOSIS — F17210 Nicotine dependence, cigarettes, uncomplicated: Secondary | ICD-10-CM | POA: Insufficient documentation

## 2018-08-30 DIAGNOSIS — R200 Anesthesia of skin: Secondary | ICD-10-CM | POA: Diagnosis not present

## 2018-08-30 MED ORDER — HYDROCODONE-ACETAMINOPHEN 5-325 MG PO TABS
1.0000 | ORAL_TABLET | Freq: Once | ORAL | Status: AC
  Administered 2018-08-30: 1 via ORAL
  Filled 2018-08-30: qty 1

## 2018-08-30 NOTE — ED Triage Notes (Signed)
Patient here for right hand pain.  He has been her twice in the last week for the same.  He has three stitches that need to come out.  He has complaining of HA and dizziness.  Patient is CAOx4.

## 2018-08-30 NOTE — ED Provider Notes (Addendum)
[facility] EMERGENCY DEPARTMENT Provider Note   CSN: [id number] Arrival date & time: 08/30/18  8295     History   Chief Complaint Chief Complaint  Patient presents with  . Hand Pain    HPI Rhonda Ayers is a 37 y.o. female.  HPI Patient presents for repeat evaluation of right hand pain.  She sustained a laceration to the hand on November 4.  Since that time she has had significant pain and swelling in the area.  She presents tonight for repeat evaluation.  No fevers.  No drainage.  She reports numbness and pain around the wound.  She reports her lawyer and insurance company has directed her through [facility].  She has not seen a hand specialist yet. Past Medical History:  Diagnosis Date  . Carpal tunnel syndrome 07/03/2016  . Depression 07/03/2016  . Health care maintenance 01/28/2017  . HIV infection (HCC)   . 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 Left leg pain 07/03/2016  . 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 Ringworm 08/04/2018  . Callus of foot 06/24/2018  . 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 Health care maintenance 01/28/2017  . Carpal tunnel syndrome 07/03/2016  . Left leg pain 07/03/2016  . Depression 07/03/2016  . 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 HIV disease (HCC) 01/31/2015  . Transgender 01/31/2015    History reviewed. No pertinent surgical history.      Home Medications    Prior to Admission medications   Medication Sig Start Date End Date Taking? Authorizing Provider  bictegravir-emtricitabine-tenofovir AF (BIKTARVY) 50-200-25 MG TABS tablet Take 1 tablet by mouth daily. 07/16/18  Yes Veryl Speak, FNP  bacitracin ointment Apply 1 application topically 2 (two) times daily. Patient not taking: Reported on 08/30/2018 08/19/18   Antony Madura, PA-C  doxycycline (VIBRAMYCIN) 100 MG capsule Take 1 capsule (100 mg total) by mouth 2 (two) times daily. 08/22/18   Couture, Cortni S, PA-C  mupirocin cream  (BACTROBAN) 2 % Apply 1 application topically 2 (two) times daily. Patient not taking: Reported on 08/30/2018 07/31/18   Rennis Harding, PA-C    Family History Family History  Problem Relation Age of Onset  . Diabetes Mother     Social History Social History   Tobacco Use  . Smoking status: Light Tobacco Smoker    Packs/day: 0.10    Types: Cigarettes    Last attempt to quit: 12/30/2013    Years since quitting: 4.6  . Smokeless tobacco: Never Used  Substance Use Topics  . Alcohol use: Yes    Alcohol/week: 0.0 standard drinks    Comment: occasional  . Drug use: No     Allergies   Motrin [ibuprofen] and Tramadol   Review of Systems Review of Systems  Constitutional: Negative for fever.  Gastrointestinal: Negative for vomiting.     Physical Exam Updated Vital Signs BP 121/84 (BP Location: Right Arm)   Pulse 80   Temp 97.9 F (36.6 C) (Oral)   Resp 16   SpO2 96%   Physical Exam CONSTITUTIONAL: Well developed/well nourished HEAD: Normocephalic/atraumatic EYES: EOMI ENMT: Mucous membranes moist NECK: supple no meningeal signs CV: S1/S2 noted, no murmurs/rubs/gallops noted LUNGS: Lungs are clear to auscultation bilaterally, no apparent distress ABDOMEN: soft NEURO: Pt is awake/alert/appropriate, moves all extremitiesx4.  No facial droop.   EXTREMITIES: pulses normal/equal, full ROM Diffuse tenderness to right hand along index finger.  No drainage, no erythema.  No crepitus.  Patient with limited range of motion of right index finger SKIN: warm, color normal    Patient gave verbal permission to utilize photo for medical documentation only The image was not stored on any personal device  ED Treatments / Results  Labs (all labs ordered are listed, but only abnormal results are displayed) Labs Reviewed - No data to display  EKG None  Radiology No results found.  Procedures Procedures (including critical care time)  Medications Ordered in  ED Medications  HYDROcodone-acetaminophen (NORCO/VICODIN) 5-325 MG per tablet 1 tablet (has no administration in time range)     Initial Impression / Assessment and Plan / ED Course  I have reviewed the triage vital signs and the nursing notes.   Patient presents for right hand check.  She sustained a laceration on November 4, had wound repair done.  No foreign bodies were noted at that time.  She reports since that time she has had pain and numbness with limited range of motion of the finger.  She never saw the hand specialist because her insurance company/lawyer have directed her care through urgent care.  She is scheduled to go to the urgent care later this morning for suture removal.  Therefore I will not remove the sutures at this time to ensure her care is consolidated at this location.  I encouraged patient to follow-up with a hand specialist due to persistent pain and swelling since the injury.  At this time there is no signs of any obvious wound infection, patient is afebrile.  Final Clinical Impressions(s) / ED Diagnoses   Final diagnoses:  Right hand pain    ED Discharge Orders    None       Zadie RhineWickline, Devanshi Califf, MD 08/30/18 [id number]    Zadie RhineWickline, Hayde Kilgour, MD 08/30/18 [phone]    Zadie RhineWickline, Zniyah Midkiff, MD 08/30/18 [phone]

## 2018-08-30 NOTE — Discharge Instructions (Addendum)
Please follow-up with a hand specialist as we discussed.

## 2018-09-01 ENCOUNTER — Encounter: Payer: Self-pay | Admitting: Infectious Disease

## 2018-09-01 ENCOUNTER — Other Ambulatory Visit: Payer: Self-pay | Admitting: Infectious Disease

## 2018-09-01 ENCOUNTER — Telehealth: Payer: Self-pay | Admitting: Behavioral Health

## 2018-09-01 ENCOUNTER — Ambulatory Visit (INDEPENDENT_AMBULATORY_CARE_PROVIDER_SITE_OTHER): Payer: Self-pay | Admitting: Infectious Disease

## 2018-09-01 VITALS — BP 121/80 | HR 67 | Temp 98.5°F | Wt 208.0 lb

## 2018-09-01 DIAGNOSIS — F640 Transsexualism: Secondary | ICD-10-CM

## 2018-09-01 DIAGNOSIS — Z5900 Homelessness unspecified: Secondary | ICD-10-CM | POA: Insufficient documentation

## 2018-09-01 DIAGNOSIS — A529 Late syphilis, unspecified: Secondary | ICD-10-CM

## 2018-09-01 DIAGNOSIS — Y990 Civilian activity done for income or pay: Secondary | ICD-10-CM

## 2018-09-01 DIAGNOSIS — S61419A Laceration without foreign body of unspecified hand, initial encounter: Secondary | ICD-10-CM

## 2018-09-01 DIAGNOSIS — Z789 Other specified health status: Secondary | ICD-10-CM

## 2018-09-01 DIAGNOSIS — B20 Human immunodeficiency virus [HIV] disease: Secondary | ICD-10-CM

## 2018-09-01 DIAGNOSIS — S61411A Laceration without foreign body of right hand, initial encounter: Secondary | ICD-10-CM

## 2018-09-01 HISTORY — DX: Homelessness unspecified: Z59.00

## 2018-09-01 HISTORY — DX: Laceration without foreign body of unspecified hand, initial encounter: S61.419A

## 2018-09-01 HISTORY — DX: Civilian activity done for income or pay: Y99.0

## 2018-09-01 MED ORDER — BICTEGRAVIR-EMTRICITAB-TENOFOV 50-200-25 MG PO TABS: 1.0000 | ORAL_TABLET | Freq: Every day | ORAL | 11 refills | Status: DC

## 2018-09-01 NOTE — Telephone Encounter (Signed)
I hope she meets with Rene Kocheregina
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
## 2018-09-02 ENCOUNTER — Ambulatory Visit: Payer: Medicaid Other | Admitting: Licensed Clinical Social Worker

## 2018-09-05 LAB — HIV RNA, RTPCR W/R GT (RTI, PI,INT)
HIV 1 RNA Quant: <20 copies/mL
HIV-1 RNA Quant, Log: <1.3 Log copies/mL

## 2018-09-30 ENCOUNTER — Telehealth: Payer: Self-pay | Admitting: *Deleted

## 2018-09-30 NOTE — Telephone Encounter (Signed)
RN reached out to patient, as she is on the viral load list, missed an appointment for bloodwork and [facility] Phone answered by "Mrs. Mayford KnifeWilliams" who offered to send a message to Special.  RN asked her to have Special call Marcelino DusterMichelle at her doctor's office. Special has rw/adap renewal scheduled for 1/2. Would like to have her get labs and see me or pharmacy for medication adherence check and see Rene KocherRegina while she is here. Andree CossHowell, Savana Spina M, RN

## 2018-10-20 ENCOUNTER — Ambulatory Visit: Payer: Medicaid Other

## 2018-10-23 ENCOUNTER — Ambulatory Visit: Payer: Self-pay | Admitting: Licensed Clinical Social Worker

## 2018-10-25 ENCOUNTER — Emergency Department (HOSPITAL_COMMUNITY)
Admission: EM | Admit: 2018-10-25 | Discharge: 2018-10-25 | Disposition: A | Discharge status: Home / Self Care | Payer: Medicaid Other | Attending: Emergency Medicine | Admitting: Emergency Medicine

## 2018-10-25 ENCOUNTER — Encounter (HOSPITAL_COMMUNITY): Payer: Self-pay | Admitting: Emergency Medicine

## 2018-10-25 DIAGNOSIS — K029 Dental caries, unspecified: Secondary | ICD-10-CM | POA: Insufficient documentation

## 2018-10-25 DIAGNOSIS — K0889 Other specified disorders of teeth and supporting structures: Secondary | ICD-10-CM

## 2018-10-25 DIAGNOSIS — Z79899 Other long term (current) drug therapy: Secondary | ICD-10-CM | POA: Insufficient documentation

## 2018-10-25 DIAGNOSIS — B20 Human immunodeficiency virus [HIV] disease: Secondary | ICD-10-CM | POA: Insufficient documentation

## 2018-10-25 DIAGNOSIS — F17210 Nicotine dependence, cigarettes, uncomplicated: Secondary | ICD-10-CM | POA: Insufficient documentation

## 2018-10-25 DIAGNOSIS — Z590 Homelessness: Secondary | ICD-10-CM | POA: Insufficient documentation

## 2018-10-25 MED ORDER — HYDROCODONE-ACETAMINOPHEN 5-325 MG PO TABS
1.0000 | ORAL_TABLET | Freq: Once | ORAL | Status: AC
  Administered 2018-10-25: 1 via ORAL
  Filled 2018-10-25: qty 1

## 2018-10-25 MED ORDER — AMOXICILLIN 500 MG PO CAPS: 500.0000 mg | ORAL_CAPSULE | Freq: Three times a day (TID) | ORAL | 0 refills | Status: DC

## 2018-10-25 MED ORDER — HYDROCODONE-ACETAMINOPHEN 5-325 MG PO TABS: 1.0000 | ORAL_TABLET | ORAL | 0 refills | Status: DC | PRN

## 2018-10-25 NOTE — ED Notes (Signed)
Pt states that "head is about to explode", sobbing

## 2018-10-25 NOTE — Discharge Instructions (Signed)
Use heat on the sore area of your right jaw.  We sent prescriptions for antibiotic and pain reliever to your pharmacy.  We do not have a dentist on call today, but have supplied a resource guide to help you find a dentist for further care and treatment.  You probably need at least 2 dental extractions to help your discomfort.

## 2018-10-25 NOTE — ED Triage Notes (Signed)
Pt c/o right sided dental pain x 4 days. Reports taken Gabapentin and Tylenol for pains even thought pt states allergic to Tylenol.

## 2018-10-25 NOTE — ED Provider Notes (Signed)
[facility]-EMERGENCY DEPT Provider Note   CSN: [id number] Arrival date & time: 10/25/18  6644     History   Chief Complaint Chief Complaint  Patient presents with  . Dental Pain    HPI Rhonda Ayers is a 38 y.o. adult.  HPI   He presents for evaluation of recurrent dental pain felt to be secondary to wisdom teeth.  Pain is in the right jaw at this time.  He denies fever, chills, weakness or dizziness.  He has not seen a dentist recently.  He requests "a shot."  There are no other known modifying factors.  Past Medical History:  Diagnosis Date  . Carpal tunnel syndrome 07/03/2016  . Depression 07/03/2016  . Hand laceration 09/01/2018  . Health care maintenance 01/28/2017  . HIV infection (HCC)   . Homelessness 09/01/2018  . 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 Left leg pain 07/03/2016  . 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 Work related injury 09/01/2018    Patient Active Problem List   Diagnosis Date Noted  . Hand laceration 09/01/2018  . Work related injury 09/01/2018  . Homelessness 09/01/2018  . Ringworm 08/04/2018  . Callus of foot 06/24/2018  . Health care maintenance 01/28/2017  . Carpal tunnel syndrome 07/03/2016  . Left leg pain 07/03/2016  . Depression 07/03/2016  . 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 HIV disease (HCC) 01/31/2015  . Transgender 01/31/2015    History reviewed. No pertinent surgical history.      Home Medications    Prior to Admission medications   Medication Sig Start Date End Date Taking? Authorizing Provider  bacitracin ointment Apply 1 application topically 2 (two) times daily. Patient not taking: Reported on 08/30/2018 08/19/18   Antony Madura, PA-C  bictegravir-emtricitabine-tenofovir AF (BIKTARVY) 50-200-25 MG TABS tablet Take 1 tablet by mouth daily. 09/01/18   Randall Hiss, MD  doxycycline (VIBRAMYCIN) 100 MG capsule Take 1 capsule (100 mg total) by mouth 2 (two) times daily. 08/22/18   Couture, Cortni S, PA-C  mupirocin  cream (BACTROBAN) 2 % Apply 1 application topically 2 (two) times daily. Patient not taking: Reported on 08/30/2018 07/31/18   Rennis Harding, PA-C    Family History Family History  Problem Relation Age of Onset  . Diabetes Mother     Social History Social History   Tobacco Use  . Smoking status: Light Tobacco Smoker    Packs/day: 0.10    Types: Cigarettes    Last attempt to quit: 12/30/2013    Years since quitting: 4.8  . Smokeless tobacco: Never Used  Substance Use Topics  . Alcohol use: Yes    Alcohol/week: 0.0 standard drinks    Comment: occasional  . Drug use: No     Allergies   Motrin [ibuprofen] and Tramadol   Review of Systems Review of Systems  All other systems reviewed and are negative.    Physical Exam Updated Vital Signs There were no vitals taken for this visit.  Physical Exam Vitals signs and nursing note reviewed.  Constitutional:      General: She is not in acute distress.    Appearance: Normal appearance. She is well-developed. She is not ill-appearing or diaphoretic.  HENT:     Head: Normocephalic and atraumatic.     Right Ear: External ear normal.     Left Ear: External ear normal.     Nose: Nose normal.     Mouth/Throat:     Comments: No trismus.  Erupting third molar, right lower, impacted.  Mild surrounding redness but no drainage or bleeding.  Large dental carry left lower, second molar, without associated swelling or deformity. Eyes:     Conjunctiva/sclera: Conjunctivae normal.     Pupils: Pupils are equal, round, and reactive to light.  Neck:     Musculoskeletal: Normal range of motion and neck supple.     Trachea: Phonation normal.  Cardiovascular:     Rate and Rhythm: Normal rate.  Pulmonary:     Effort: Pulmonary effort is normal.  Musculoskeletal: Normal range of motion.  Skin:    General: Skin is warm and dry.  Neurological:     Mental Status: She is alert and oriented to person, place, and time.     Cranial Nerves:  No cranial nerve deficit.     Sensory: No sensory deficit.     Motor: No abnormal muscle tone.     Coordination: Coordination normal.  Psychiatric:        Behavior: Behavior normal.        Thought Content: Thought content normal.        Judgment: Judgment normal.      ED Treatments / Results  Labs (all labs ordered are listed, but only abnormal results are displayed) Labs Reviewed - No data to display  EKG None  Radiology No results found.  Procedures Procedures (including critical care time)  Medications Ordered in ED Medications - No data to display   Initial Impression / Assessment and Plan / ED Course  I have reviewed the triage vital signs and the nursing notes.  Pertinent labs & imaging results that were available during my care of the patient were reviewed by me and considered in my medical decision making (see chart for details).     Patient Vitals for the past 24 hrs:  BP Temp Temp src Pulse Resp SpO2  10/25/18 0825 (!) 138/95 98.4 F (36.9 C) Oral 83 18 98 %    8:36 AM Reevaluation with update and discussion. After initial assessment and treatment, an updated evaluation reveals no change in clinical status.  Findings discussed with the patient and all questions and were answered. Mancel BaleElliott Christia Domke   Medical Decision Making: Dental caries with impacted wisdom tooth.  No evidence for significant local infection.  Doubt deep tissue infection.  No indication for further ED evaluation or treatment  CRITICAL CARE-no Performed by: Mancel BaleElliott Shailene Demonbreun  Nursing Notes Reviewed/ Care Coordinated Applicable Imaging Reviewed Interpretation of Laboratory Data incorporated into ED treatment  The patient appears reasonably screened and/or stabilized for discharge and I doubt any other medical condition or other [facility] requiring further screening, evaluation, or treatment in the ED at this time prior to discharge.  Plan: Home Medications-routine OTC medicine; Home Treatments-heat  to affected area; return here if the recommended treatment, does not improve the symptoms; Recommended follow up-dental follow-up as soon as possible   Final Clinical Impressions(s) / ED Diagnoses   Final diagnoses:  None    ED Discharge Orders    None       Mancel BaleWentz, Chenise Mulvihill, MD 10/25/18 [phone]

## 2018-11-18 ENCOUNTER — Other Ambulatory Visit: Payer: Medicaid Other

## 2018-11-21 ENCOUNTER — Other Ambulatory Visit: Payer: Medicaid Other

## 2018-11-21 DIAGNOSIS — B20 Human immunodeficiency virus [HIV] disease: Secondary | ICD-10-CM

## 2018-11-21 LAB — T-HELPER CELL (CD4) - (RCID CLINIC ONLY)
CD4 % Helper T Cell: 32 % — ABNORMAL LOW (ref 33–55)
CD4 T Cell Abs: 460 /uL (ref 400–2700)

## 2018-11-25 LAB — CBC WITH DIFFERENTIAL/PLATELET
ABSOLUTE MONOCYTES: 223 {cells}/uL (ref 200–950)
BASOS ABS: 31 {cells}/uL (ref 0–200)
Basophils Relative: 1 %
EOS ABS: 121 {cells}/uL (ref 15–500)
Eosinophils Relative: 3.9 %
HEMATOCRIT: 46.9 % (ref 38.5–50.0)
Hemoglobin: 15.9 g/dL (ref 13.2–17.1)
LYMPHS ABS: 1485 {cells}/uL (ref 850–3900)
MCH: 29.9 pg (ref 27.0–33.0)
MCHC: 33.9 g/dL (ref 32.0–36.0)
MCV: 88.2 fL (ref 80.0–100.0)
MPV: 10.3 fL (ref 7.5–12.5)
Monocytes Relative: 7.2 %
NEUTROS PCT: 40 %
Neutro Abs: 1240 cells/uL — ABNORMAL LOW (ref 1500–7800)
Platelets: 225 10*3/uL (ref 140–400)
RBC: 5.32 10*6/uL (ref 4.20–5.80)
RDW: 12.7 % (ref 11.0–15.0)
TOTAL LYMPHOCYTE: 47.9 %
WBC: 3.1 10*3/uL — ABNORMAL LOW (ref 3.8–10.8)

## 2018-11-25 LAB — COMPLETE METABOLIC PANEL WITH GFR
AG Ratio: 1.3 (calc) (ref 1.0–2.5)
ALBUMIN MSPROF: 4.3 g/dL (ref 3.6–5.1)
ALT: 14 U/L (ref 9–46)
AST: 20 U/L (ref 10–40)
Alkaline phosphatase (APISO): 69 U/L (ref 36–130)
BUN: 11 mg/dL (ref 7–25)
CO2: 26 mmol/L (ref 20–32)
CREATININE: 0.93 mg/dL (ref 0.60–1.35)
Calcium: 9.8 mg/dL (ref 8.6–10.3)
Chloride: 104 mmol/L (ref 98–110)
GFR, EST AFRICAN AMERICAN: 123 mL/min/{1.73_m2} (ref >=60)
GFR, Est Non African American: 106 mL/min/{1.73_m2} (ref >=60)
GLOBULIN: 3.2 g/dL (ref 1.9–3.7)
GLUCOSE: 103 mg/dL — AB (ref 65–99)
Potassium: 4 mmol/L (ref 3.5–5.3)
SODIUM: 138 mmol/L (ref 135–146)
TOTAL PROTEIN: 7.5 g/dL (ref 6.1–8.1)
Total Bilirubin: 0.5 mg/dL (ref 0.2–1.2)

## 2018-11-25 LAB — RPR: RPR: REACTIVE — AB

## 2018-11-25 LAB — HIV-1 RNA QUANT-NO REFLEX-BLD
HIV 1 RNA Quant: <20 copies/mL
HIV-1 RNA Quant, Log: <1.3 Log copies/mL

## 2018-11-25 LAB — RPR TITER: RPR Titer: 1:4 {titer} — ABNORMAL HIGH

## 2018-11-25 LAB — FLUORESCENT TREPONEMAL AB(FTA)-IGG-BLD: Fluorescent Treponemal ABS: REACTIVE — AB

## 2018-12-04 ENCOUNTER — Ambulatory Visit: Payer: Medicaid Other | Admitting: Infectious Diseases

## 2018-12-09 ENCOUNTER — Emergency Department (HOSPITAL_COMMUNITY): Payer: Medicaid Other

## 2018-12-09 ENCOUNTER — Emergency Department (HOSPITAL_COMMUNITY)
Admission: EM | Admit: 2018-12-09 | Discharge: 2018-12-09 | Disposition: A | Discharge status: Home / Self Care | Payer: Medicaid Other | Attending: Emergency Medicine | Admitting: Emergency Medicine

## 2018-12-09 ENCOUNTER — Encounter (HOSPITAL_COMMUNITY): Payer: Self-pay

## 2018-12-09 DIAGNOSIS — Z79899 Other long term (current) drug therapy: Secondary | ICD-10-CM | POA: Insufficient documentation

## 2018-12-09 DIAGNOSIS — F17210 Nicotine dependence, cigarettes, uncomplicated: Secondary | ICD-10-CM | POA: Insufficient documentation

## 2018-12-09 DIAGNOSIS — J111 Influenza due to unidentified influenza virus with other respiratory manifestations: Secondary | ICD-10-CM | POA: Insufficient documentation

## 2018-12-09 DIAGNOSIS — R69 Illness, unspecified: Secondary | ICD-10-CM

## 2018-12-09 LAB — COMPREHENSIVE METABOLIC PANEL
ALK PHOS: 76 U/L (ref 38–126)
ALT: 23 U/L (ref 0–44)
ANION GAP: 5 (ref 5–15)
AST: 40 U/L (ref 15–41)
Albumin: 4.5 g/dL (ref 3.5–5.0)
BUN: 18 mg/dL (ref 6–20)
CO2: 27 mmol/L (ref 22–32)
Calcium: 9.1 mg/dL (ref 8.9–10.3)
Chloride: 104 mmol/L (ref 98–111)
Creatinine, Ser: 0.92 mg/dL (ref 0.61–1.24)
GFR calc non Af Amer: >60 mL/min (ref >60)
GLUCOSE: 100 mg/dL — AB (ref 70–99)
POTASSIUM: 4.3 mmol/L (ref 3.5–5.1)
SODIUM: 136 mmol/L (ref 135–145)
TOTAL PROTEIN: 8.3 g/dL — AB (ref 6.5–8.1)
Total Bilirubin: 0.6 mg/dL (ref 0.3–1.2)

## 2018-12-09 LAB — CBC WITH DIFFERENTIAL/PLATELET
Abs Immature Granulocytes: 0.01 K/uL (ref 0.00–0.07)
Basophils Absolute: 0 K/uL (ref 0.0–0.1)
Basophils Relative: 1 %
Eosinophils Absolute: 0.2 K/uL (ref 0.0–0.5)
Eosinophils Relative: 6 %
HCT: 49.8 % (ref 39.0–52.0)
Hemoglobin: 16 g/dL (ref 13.0–17.0)
Immature Granulocytes: 0 %
Lymphocytes Relative: 50 %
Lymphs Abs: 1.8 K/uL (ref 0.7–4.0)
MCH: 30.1 pg (ref 26.0–34.0)
MCHC: 32.1 g/dL (ref 30.0–36.0)
MCV: 93.8 fL (ref 80.0–100.0)
Monocytes Absolute: 0.4 K/uL (ref 0.1–1.0)
Monocytes Relative: 10 %
Neutro Abs: 1.2 K/uL — ABNORMAL LOW (ref 1.7–7.7)
Neutrophils Relative %: 33 %
Platelets: 212 K/uL (ref 150–400)
RBC: 5.31 MIL/uL (ref 4.22–5.81)
RDW: 13.7 % (ref 11.5–15.5)
WBC: 3.7 K/uL — ABNORMAL LOW (ref 4.0–10.5)
nRBC: 0 % (ref 0.0–0.2)

## 2018-12-09 LAB — LIPASE, BLOOD: Lipase: 86 U/L — ABNORMAL HIGH (ref 11–51)

## 2018-12-09 LAB — INFLUENZA PANEL BY PCR (TYPE A & B)
INFLAPCR: NEGATIVE
INFLBPCR: NEGATIVE

## 2018-12-09 NOTE — Discharge Instructions (Addendum)
You are seen in the emergency department for flulike symptoms with body aches cough nasal congestion nausea vomiting.  You had blood work and a chest x-ray that did not show an obvious cause of your symptoms.  Your rapid flu testing here was negative.  You should continue to stay well-hydrated and use Tylenol as needed for aches and pains.  Follow-up with your doctor and return if any worsening symptoms.

## 2018-12-09 NOTE — ED Provider Notes (Signed)
[facility]-EMERGENCY DEPT Provider Note   CSN: [id number] Arrival date & time: 12/09/18  7867    History   Chief Complaint Chief Complaint  Patient presents with  . URI  . Abdominal Pain  . Nausea  . Emesis    HPI Rhonda Ayers is a 38 y.o. adult.  He has a history of HIV and syphilis.  He is presenting with 3 days of feeling sick.  He has had nasal congestion rhinorrhea cough productive of some phlegm with some specks of blood headache body aches nausea vomiting and some intermittent abdominal pain.  No known fever.  No diarrhea constipation or urinary symptoms.  No rashes.  He has sick contact with the flu recently.  He is tried some over-the-counter flu medicine without any improvement.     The history is provided by the patient.  Influenza  Presenting symptoms: cough, fatigue, headache, myalgias, nausea, rhinorrhea and vomiting   Presenting symptoms: no diarrhea, no fever, no shortness of breath and no sore throat   Cough:    Cough characteristics:  Productive   Sputum characteristics:  Yellow   Severity:  Moderate   Onset quality:  Gradual   Timing:  Intermittent   Progression:  Unchanged   Chronicity:  New Associated symptoms: nasal congestion     Past Medical History:  Diagnosis Date  . Carpal tunnel syndrome 07/03/2016  . Depression 07/03/2016  . Hand laceration 09/01/2018  . Health care maintenance 01/28/2017  . HIV infection (HCC)   . Homelessness 09/01/2018  . 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 Left leg pain 07/03/2016  . 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 Work related injury 09/01/2018    Patient Active Problem List   Diagnosis Date Noted  . Hand laceration 09/01/2018  . Work related injury 09/01/2018  . Homelessness 09/01/2018  . Ringworm 08/04/2018  . Callus of foot 06/24/2018  . Health care maintenance 01/28/2017  . Carpal tunnel syndrome 07/03/2016  . Left leg pain 07/03/2016  . Depression 07/03/2016  . 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  HIV disease (HCC) 01/31/2015  . Transgender 01/31/2015    History reviewed. No pertinent surgical history.      Home Medications    Prior to Admission medications   Medication Sig Start Date End Date Taking? Authorizing Provider  amoxicillin (AMOXIL) 500 MG capsule Take 1 capsule (500 mg total) by mouth 3 (three) times daily. 10/25/18   Mancel Bale, MD  bacitracin ointment Apply 1 application topically 2 (two) times daily. Patient not taking: Reported on 08/30/2018 08/19/18   Antony Madura, PA-C  bictegravir-emtricitabine-tenofovir AF (BIKTARVY) 50-200-25 MG TABS tablet Take 1 tablet by mouth daily. 09/01/18   Randall Hiss, MD  doxycycline (VIBRAMYCIN) 100 MG capsule Take 1 capsule (100 mg total) by mouth 2 (two) times daily. 08/22/18   Couture, Cortni S, PA-C  HYDROcodone-acetaminophen (NORCO) 5-325 MG tablet Take 1 tablet by mouth every 4 (four) hours as needed for moderate pain. 10/25/18   Mancel Bale, MD  mupirocin cream (BACTROBAN) 2 % Apply 1 application topically 2 (two) times daily. Patient not taking: Reported on 08/30/2018 07/31/18   Rennis Harding, PA-C    Family History Family History  Problem Relation Age of Onset  . Diabetes Mother     Social History Social History   Tobacco Use  . Smoking status: Light Tobacco Smoker    Packs/day: 0.10    Types: Cigarettes    Last attempt to quit: 12/30/2013    Years since quitting:  4.9  . Smokeless tobacco: Never Used  Substance Use Topics  . Alcohol use: Yes    Alcohol/week: 0.0 standard drinks    Comment: occasional  . Drug use: No     Allergies   Motrin [ibuprofen] and Tramadol   Review of Systems Review of Systems  Constitutional: Positive for fatigue. Negative for fever.  HENT: Positive for congestion and rhinorrhea. Negative for facial swelling and sore throat.   Eyes: Negative for visual disturbance.  Respiratory: Positive for cough. Negative for shortness of breath.   Cardiovascular: Negative  for chest pain.  Gastrointestinal: Positive for abdominal pain, nausea and vomiting. Negative for diarrhea.  Genitourinary: Negative for dysuria.  Musculoskeletal: Positive for myalgias.  Skin: Negative for rash.  Allergic/Immunologic: Negative for immunocompromised state.  Neurological: Positive for headaches.     Physical Exam Updated Vital Signs BP 131/81 (BP Location: Left Arm)   Pulse 79   Temp 97.9 F (36.6 C) (Oral)   Resp 16   SpO2 100%   Physical Exam Vitals signs and nursing note reviewed.  Constitutional:      Appearance: She is well-developed.  HENT:     Head: Normocephalic and atraumatic.     Right Ear: Tympanic membrane normal.     Left Ear: Tympanic membrane normal.     Nose: Congestion and rhinorrhea present.     Mouth/Throat:     Pharynx: No oropharyngeal exudate or posterior oropharyngeal erythema.  Eyes:     Conjunctiva/sclera: Conjunctivae normal.  Neck:     Musculoskeletal: Neck supple.  Cardiovascular:     Rate and Rhythm: Normal rate and regular rhythm.     Heart sounds: No murmur.  Pulmonary:     Effort: Pulmonary effort is normal. No respiratory distress.     Breath sounds: Normal breath sounds.  Abdominal:     Palpations: Abdomen is soft.     Tenderness: There is no abdominal tenderness. There is no guarding.  Musculoskeletal: Normal range of motion.        General: No deformity.     Right lower leg: No edema.     Left lower leg: No edema.  Lymphadenopathy:     Cervical: No cervical adenopathy.  Skin:    General: Skin is warm and dry.     Capillary Refill: Capillary refill takes less than 2 seconds.  Neurological:     General: No focal deficit present.     Mental Status: She is alert and oriented to person, place, and time.     GCS: GCS eye subscore is 4. GCS verbal subscore is 5. GCS motor subscore is 6.     Gait: Gait normal.      ED Treatments / Results  Labs (all labs ordered are listed, but only abnormal results are  displayed) Labs Reviewed  COMPREHENSIVE METABOLIC PANEL - Abnormal; Notable for the following components:      Result Value   Glucose, Bld 100 (*)    Total Protein 8.3 (*)    All other components within normal limits  LIPASE, BLOOD - Abnormal; Notable for the following components:   Lipase 86 (*)    All other components within normal limits  CBC WITH DIFFERENTIAL/PLATELET - Abnormal; Notable for the following components:   WBC 3.7 (*)    Neutro Abs 1.2 (*)    All other components within normal limits  INFLUENZA PANEL BY PCR (TYPE A & B)    EKG None  Radiology Dg Chest 2 View  Result Date:  12/09/2018 CLINICAL DATA:  Cough and fever EXAM: CHEST - 2 VIEW COMPARISON:  December 06, 2017 FINDINGS: The lungs are clear. The heart size and pulmonary vascularity are normal. No adenopathy. No bone lesions. No pneumothorax. IMPRESSION: No edema or consolidation. Electronically Signed   By: Bretta Bang III M.D.   On: 12/09/2018 09:07    Procedures Procedures (including critical care time)  Medications Ordered in ED Medications - No data to display   Initial Impression / Assessment and Plan / ED Course  I have reviewed the triage vital signs and the nursing notes.  Pertinent labs & imaging results that were available during my care of the patient were reviewed by me and considered in my medical decision making (see chart for details).  Clinical Course as of Dec 10 800  Tue Dec 09, 2018  1028 Patient with upper respiratory/flulike illness here with normal vitals and benign physical exam.  His lab work is fairly unremarkable other than a mild bump in his lipase which I cannot account for.  He is follow-up with the ID clinic.  He is asking for a note for work which think is reasonable.  Otherwise at telemetry symptomatic treatment with Tylenol fluids and rest.   [MB]    Clinical Course User Index [MB] Terrilee Files, MD        Final Clinical Impressions(s) / ED Diagnoses    Final diagnoses:  Influenza-like illness    ED Discharge Orders    None       Terrilee Files, MD 12/10/18 [phone]

## 2018-12-09 NOTE — ED Triage Notes (Addendum)
Patient c/o "feeling bad" x 3 days.   C/O chest congestion, headache, weakness, runny nose, productive cough (green/thick). Family member has flu.  Patient states vomited x2 this morning.  C/O nausea in triage.   C/o lower abdominal gas pain.   Denies diarrhea. Last BM this AM.   Ambulatory in triage.  A/Ox4  Patient would like to be called "Special". See Gender Identity.   Hx. HIV.

## 2018-12-15 ENCOUNTER — Encounter: Payer: Self-pay | Admitting: Infectious Disease

## 2018-12-15 ENCOUNTER — Ambulatory Visit (INDEPENDENT_AMBULATORY_CARE_PROVIDER_SITE_OTHER): Payer: Self-pay | Admitting: Infectious Disease

## 2018-12-15 ENCOUNTER — Other Ambulatory Visit (HOSPITAL_COMMUNITY)
Admission: RE | Admit: 2018-12-15 | Discharge: 2018-12-15 | Disposition: A | Discharge status: Home / Self Care | Payer: Medicaid Other | Source: Ambulatory Visit | Attending: Infectious Disease | Admitting: Infectious Disease

## 2018-12-15 VITALS — BP 125/80 | HR 70 | Temp 98.0°F

## 2018-12-15 DIAGNOSIS — K6289 Other specified diseases of anus and rectum: Secondary | ICD-10-CM | POA: Diagnosis not present

## 2018-12-15 DIAGNOSIS — B20 Human immunodeficiency virus [HIV] disease: Secondary | ICD-10-CM

## 2018-12-15 DIAGNOSIS — Z79899 Other long term (current) drug therapy: Secondary | ICD-10-CM

## 2018-12-15 DIAGNOSIS — R7309 Other abnormal glucose: Secondary | ICD-10-CM

## 2018-12-15 DIAGNOSIS — Z8619 Personal history of other infectious and parasitic diseases: Secondary | ICD-10-CM

## 2018-12-15 DIAGNOSIS — Z789 Other specified health status: Secondary | ICD-10-CM

## 2018-12-15 DIAGNOSIS — A630 Anogenital (venereal) warts: Secondary | ICD-10-CM

## 2018-12-15 DIAGNOSIS — A529 Late syphilis, unspecified: Secondary | ICD-10-CM

## 2018-12-15 DIAGNOSIS — Z720 Tobacco use: Secondary | ICD-10-CM

## 2018-12-15 DIAGNOSIS — K644 Residual hemorrhoidal skin tags: Secondary | ICD-10-CM

## 2018-12-15 DIAGNOSIS — F640 Transsexualism: Secondary | ICD-10-CM

## 2018-12-15 HISTORY — DX: Other specified diseases of anus and rectum: K62.89

## 2018-12-15 MED ORDER — SPIRONOLACTONE 50 MG PO TABS: 50.0000 mg | ORAL_TABLET | Freq: Every day | ORAL | 11 refills | Status: DC

## 2018-12-15 NOTE — Patient Instructions (Signed)
I would like you to be seen in Dr. Tyrone Apple HRA clinic

## 2018-12-15 NOTE — Progress Notes (Signed)
Subjective:   Chief complaints:  Concerns about fluctuating weight, also worried about pain in the rectum which is causing her not to have receptive anal intercourse     Patient ID: Rhonda Ayers, adult    DOB: 06/14/1981, 38 y.o.   MRN: [id number]      38 year old now trans gender woman, Special  Her  HIV disease diagnosed in 45s but then started on Atripla in 2005.  She has missed ADAP renewal and run out of TRIUMEQ and started Atripla that she had left over from when she was in prison.   At her last visit we had started her on BIKTARVY. She failed to show up for an appt in part because of deaths in family including of her aunt. She also did not have a care--though we could have engaged with [facility] to help get her transportation to clinic. In the interim she started back on Atripla that she had left from prison and returned engaged with age map  She was placed back on Biktarvy and has suppressed her virus nicely.  She has not been on Aldactone or estradiol therapy to assist her with her transgender female to female transformation.  Marland Kitchen  He is complaining of pain with any receptive anal intercourse and is not having sex anymore.  Also having pain with defecation.  She has had a history of anal warts that were resected in the past and certainly is at risk for not anal neoplasia.  She also has anxiety about her blood sugar having been elevated and concerns about her weight fluctuating.         Lab Results  Component Value Date   HIV1RNAQUANT <20 NOT DETECTED 11/21/2018   HIV1RNAQUANT <20 DETECTED 09/01/2018   HIV1RNAQUANT 451 (H) 08/04/2018   Lab Results  Component Value Date   CD4TABS 460 11/21/2018   CD4TABS 660 08/04/2018   CD4TABS 520 06/11/2018      Past Medical History:  Diagnosis Date  . Carpal tunnel syndrome 07/03/2016  . Depression 07/03/2016  . Hand laceration 09/01/2018  . Health care maintenance 01/28/2017  . HIV infection (HCC)   . Homelessness  09/01/2018  . 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 Left leg pain 07/03/2016  . Rectal pain 12/15/2018  . 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 Work related injury 09/01/2018    No past surgical history on file.  Family History  Problem Relation Age of Onset  . Diabetes Mother       Social History   Socioeconomic History  . Marital status: Single    Spouse name: Not on file  . Number of children: Not on file  . Years of education: Not on file  . Highest education level: Not on file  Occupational History  . Not on file  Social Needs  . Financial resource strain: Not on file  . Food insecurity:    Worry: Not on file    Inability: Not on file  . Transportation needs:    Medical: Not on file    Non-medical: Not on file  Tobacco Use  . Smoking status: Light Tobacco Smoker    Packs/day: 0.10    Types: Cigarettes  . Smokeless tobacco: Never Used  . Tobacco comment: "2 cigarettes a day"   Substance and Sexual Activity  . Alcohol use: Yes    Alcohol/week: 0.0 standard drinks    Comment: occasional  . Drug use: No  . Sexual activity: Never    Partners: Male    Birth  control/protection: Condom    Comment: given condoms  Lifestyle  . Physical activity:    Days per week: Not on file    Minutes per session: Not on file  . Stress: Not on file  Relationships  . Social connections:    Talks on phone: Not on file    Gets together: Not on file    Attends religious service: Not on file    Active member of club or organization: Not on file    Attends meetings of clubs or organizations: Not on file    Relationship status: Not on file  Other Topics Concern  . Not on file  Social History Narrative  . Not on file    Allergies  Allergen Reactions  . Motrin [Ibuprofen] Nausea And Vomiting  . Tramadol Other (See Comments)    "shakes"     Current Outpatient Medications:  .  bictegravir-emtricitabine-tenofovir AF (BIKTARVY) 50-200-25 MG TABS tablet, Take 1 tablet by mouth daily., Disp: 30  tablet, Rfl: 11 .  bacitracin ointment, Apply 1 application topically 2 (two) times daily. (Patient not taking: Reported on 08/30/2018), Disp: 15 g, Rfl: 0 .  doxycycline (VIBRAMYCIN) 100 MG capsule, Take 1 capsule (100 mg total) by mouth 2 (two) times daily., Disp: 20 capsule, Rfl: 0 .  HYDROcodone-acetaminophen (NORCO) 5-325 MG tablet, Take 1 tablet by mouth every 4 (four) hours as needed for moderate pain. (Patient not taking: Reported on 12/15/2018), Disp: 20 tablet, Rfl: 0 .  mupirocin cream (BACTROBAN) 2 %, Apply 1 application topically 2 (two) times daily. (Patient not taking: Reported on 08/30/2018), Disp: 15 g, Rfl: 0 .  spironolactone (ALDACTONE) 50 MG tablet, Take 1 tablet (50 mg total) by mouth daily., Disp: 30 tablet, Rfl: 11    Review of Systems  Constitutional: Positive for unexpected weight change. Negative for activity change, appetite change, chills, diaphoresis, fatigue and fever.  HENT: Negative for congestion, rhinorrhea, sinus pressure, sneezing, sore throat and trouble swallowing.   Eyes: Negative for photophobia and visual disturbance.  Respiratory: Negative for cough, chest tightness, shortness of breath, wheezing and stridor.   Cardiovascular: Negative for chest pain, palpitations and leg swelling.  Gastrointestinal: Positive for rectal pain. Negative for abdominal distention, abdominal pain, anal bleeding, blood in stool, constipation, diarrhea, nausea and vomiting.  Genitourinary: Negative for difficulty urinating, dysuria, flank pain and hematuria.  Musculoskeletal: Negative for back pain, gait problem, joint swelling and myalgias.  Skin: Negative for pallor, rash and wound.  Neurological: Negative for dizziness, tremors, weakness and light-headedness.  Hematological: Negative for adenopathy. Does not bruise/bleed easily.  Psychiatric/Behavioral: Negative for agitation, behavioral problems, confusion, decreased concentration, dysphoric mood and sleep disturbance.        Objective:   Physical Exam  Constitutional: She is oriented to person, place, and time. She appears well-developed and well-nourished.  HENT:  Head: Normocephalic and atraumatic.  Eyes: Conjunctivae and EOM are normal.  Neck: Normal range of motion. Neck supple.  Cardiovascular: Normal rate and regular rhythm.  Pulmonary/Chest: Effort normal. No respiratory distress. She has no wheezes.  Abdominal: Soft. She exhibits no distension.  Genitourinary: Rectum:     Tenderness and external hemorrhoid present.     No rectal mass, anal fissure, internal hemorrhoid or abnormal anal tone.  Prostate is not enlarged.  Musculoskeletal: Normal range of motion.        General: No tenderness or edema.  Neurological: She is alert and oriented to person, place, and time.  Skin: Skin is warm and dry. No  rash noted. No pallor.  Psychiatric: She has a normal mood and affect. Her behavior is normal. Judgment and thought content normal.  Nursing note and vitals reviewed.      Assessment & Plan:   HIV: Continue BIKTARVY check labs today and, sure HMA P renewal bring back in the summertime for renewal again.    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  She is trying to get off cigarettes altogether to abrogate the risk of DVT and cardia vascular events with estradiol potentially in the future  Rectal pain: Will get testing for gonorrhea and chlamydia on swab today.  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 Ninetta Lights  Forms of weight fluctuation: Reviewed data regarding integrates strand transfer inhibitors and TAF and weight gain.  Does not sound like the weight gain is become excessive and special tends to gain and lose weight so I do not see a strong compelling reason to change her off of this first-line regimen.  I spent greater than 25 minutes with the patient including greater than 50% of time in  face to face counsel of the patient and in coordination of their care.   Syphilis: Titers are stable  Lezlie Lye about elevated blood sugar we will check an A1c  I spent greater than 40 minutes with the patient including greater than 50% of time in face to face counsel of the patient about her antiretroviral regimen her medications she will need for transgender female to female transition, work-up of her rectal pain history of a genital warts and in coordination of her care.

## 2018-12-16 LAB — HEMOGLOBIN A1C
Hgb A1c MFr Bld: 5.7 % of total Hgb — ABNORMAL HIGH (ref <5.7)
Mean Plasma Glucose: 117 (calc)
eAG (mmol/L): 6.5 (calc)

## 2018-12-16 LAB — CYTOLOGY, (ORAL, ANAL, URETHRAL) ANCILLARY ONLY
Chlamydia: NEGATIVE
Neisseria Gonorrhea: NEGATIVE

## 2018-12-16 LAB — URINE CYTOLOGY ANCILLARY ONLY
Chlamydia: NEGATIVE
Neisseria Gonorrhea: NEGATIVE

## 2018-12-18 LAB — CYTOLOGY, (ORAL, ANAL, URETHRAL) ANCILLARY ONLY
Chlamydia: NEGATIVE
Neisseria Gonorrhea: NEGATIVE

## 2019-01-05 ENCOUNTER — Telehealth: Payer: Self-pay | Admitting: *Deleted

## 2019-01-05 ENCOUNTER — Encounter: Payer: Self-pay | Admitting: Infectious Disease

## 2019-01-05 NOTE — Telephone Encounter (Signed)
Patient has questions about how to get her adap renewed, how to get the documentation required to [facility].  Please call her back at listed phone number. Andree Coss, RN

## 2019-02-04 ENCOUNTER — Encounter: Payer: Self-pay | Admitting: *Deleted

## 2019-02-04 NOTE — Congregational Nurse Program (Signed)
Pt has medication. No problems getting his medications. No c/o voiced.

## 2019-02-04 NOTE — Telephone Encounter (Signed)
This encounter was created in error - please disregard.

## 2019-02-11 NOTE — Progress Notes (Signed)
COVID Hotel Screening performed. Temperature, PHQ-9, and need for medical care and medications assessed. Patient reports having issues with pain from a "bad" tooth. Went to [facility] to have tooth looked at and was given medication, but wanted the tooth pulled. They referred patient to a oral surgeon, but patient didn't want to pay the cost for the 3 teeth to be extracted. Patient now states that she has put money down on a dental plan and will be okay. No referral made at this time.  Carlyle Basques RN MSN

## 2019-02-16 ENCOUNTER — Telehealth: Payer: Self-pay | Admitting: *Deleted

## 2019-02-16 DIAGNOSIS — B20 Human immunodeficiency virus [HIV] disease: Secondary | ICD-10-CM

## 2019-02-16 DIAGNOSIS — F640 Transsexualism: Secondary | ICD-10-CM

## 2019-02-16 NOTE — Telephone Encounter (Signed)
She can come in for BMP, HIV VL, CD4, CBC RPR, GC and chlamydia testing if she wishes. I really need BMP given aldactone to make sure she is not hyper K

## 2019-02-16 NOTE — Congregational Nurse Program (Signed)
Closing encounter per request. 

## 2019-02-16 NOTE — Telephone Encounter (Signed)
Orders placed, lab appointment made for 5/5. Patient notified and approved.

## 2019-02-16 NOTE — Addendum Note (Signed)
Addended by: Andree Coss on: 02/16/2019 04:54 PM   Modules accepted: Orders

## 2019-02-16 NOTE — Telephone Encounter (Signed)
Patient called with 2 questions. 1, she needs help reaching out to Nurse Byrd Hesselbach who offered to connect her to dental care locally. RN will 'cc Byrd Hesselbach on this phone note.  Special can be reached at [phone]. 2, Special wanted to know when she should come in for labs following addition of spironolactone to her regimen. She started this after her last visit 3/2, feels there is a difference in her mood and appearance.  Please advise on which labs she needs drawn and when.  Andree Coss, RN

## 2019-02-17 ENCOUNTER — Other Ambulatory Visit: Payer: Medicaid Other

## 2019-02-19 NOTE — Progress Notes (Signed)
COVID Hotel Screening performed. Temperature, PHQ-9, and need for medical care and medications assessed. No additional needs assessed at this time.  Thompson Mckim RN MSN 

## 2019-02-26 ENCOUNTER — Other Ambulatory Visit: Payer: Self-pay

## 2019-03-05 NOTE — Progress Notes (Signed)
COVID Hotel Screening performed. Temperature, PHQ-9, and need for medical care and medications assessed. No additional needs assessed at this time.  Rache Klimaszewski RN MSN 

## 2019-03-19 ENCOUNTER — Other Ambulatory Visit (HOSPITAL_COMMUNITY): Payer: Self-pay

## 2019-03-19 DIAGNOSIS — Z20822 Contact with and (suspected) exposure to covid-19: Secondary | ICD-10-CM

## 2019-03-20 ENCOUNTER — Other Ambulatory Visit: Payer: Self-pay

## 2019-03-20 DIAGNOSIS — Z20822 Contact with and (suspected) exposure to covid-19: Secondary | ICD-10-CM

## 2019-03-22 LAB — NOVEL CORONAVIRUS, NAA: SARS-CoV-2, NAA: NOT DETECTED

## 2019-03-23 NOTE — Progress Notes (Signed)
[place] Screening performed. COVID screening, temperature, and need for medical care and medications assessed. Patient agreed to the COVID-19 testing. Patient requests a referral to dentistry for tooth pain. Referral sent to [facility].  Arnold Long RN MSN

## 2019-04-02 ENCOUNTER — Telehealth: Payer: Self-pay

## 2019-04-02 NOTE — Telephone Encounter (Signed)
6/12 Message left, awaiting call back

## 2019-04-18 ENCOUNTER — Emergency Department (HOSPITAL_BASED_OUTPATIENT_CLINIC_OR_DEPARTMENT_OTHER)
Admission: EM | Admit: 2019-04-18 | Discharge: 2019-04-19 | Disposition: A | Discharge status: Home / Self Care | Payer: Medicaid Other | Attending: Emergency Medicine | Admitting: Emergency Medicine

## 2019-04-18 ENCOUNTER — Other Ambulatory Visit: Payer: Self-pay

## 2019-04-18 ENCOUNTER — Encounter (HOSPITAL_BASED_OUTPATIENT_CLINIC_OR_DEPARTMENT_OTHER): Payer: Self-pay | Admitting: *Deleted

## 2019-04-18 DIAGNOSIS — B20 Human immunodeficiency virus [HIV] disease: Secondary | ICD-10-CM | POA: Insufficient documentation

## 2019-04-18 DIAGNOSIS — Z79899 Other long term (current) drug therapy: Secondary | ICD-10-CM | POA: Insufficient documentation

## 2019-04-18 DIAGNOSIS — J4521 Mild intermittent asthma with (acute) exacerbation: Secondary | ICD-10-CM | POA: Insufficient documentation

## 2019-04-18 DIAGNOSIS — Z720 Tobacco use: Secondary | ICD-10-CM | POA: Insufficient documentation

## 2019-04-18 HISTORY — DX: Unspecified asthma, uncomplicated: J45.909

## 2019-04-18 NOTE — ED Triage Notes (Signed)
Pt has Hx of asthma. Out of rescue inhaler. Reports SOB x 2 hours. Denies fever, cough

## 2019-04-19 MED ORDER — ALBUTEROL SULFATE HFA 108 (90 BASE) MCG/ACT IN AERS
2.0000 | INHALATION_SPRAY | Freq: Once | RESPIRATORY_TRACT | Status: AC
  Administered 2019-04-19: 2 via RESPIRATORY_TRACT
  Filled 2019-04-19: qty 6.7

## 2019-04-19 MED ORDER — DEXAMETHASONE SODIUM PHOSPHATE 10 MG/ML IJ SOLN
10.0000 mg | Freq: Once | INTRAMUSCULAR | Status: AC
  Administered 2019-04-19: 10 mg via INTRAMUSCULAR
  Filled 2019-04-19: qty 1

## 2019-04-19 NOTE — Progress Notes (Signed)
Patient ambulated around the department while on pulse ox.  Upon returning to the room patient's SPO2 was 98% and HR was 92.

## 2019-04-19 NOTE — ED Provider Notes (Signed)
[facility] EMERGENCY DEPARTMENT Provider Note   CSN: [id number] Arrival date & time: 04/18/19  2336     History   Chief Complaint Chief Complaint  Patient presents with  . Asthma    HPI Rhonda Ayers is a 38 y.o. adult.     HPI  This is a 38 year old female with a history of asthma, HIV who presents with shortness of breath.  Patient reports 2-hour history of shortness of breath and wheezing.  Patient reports that he has been cooking all day and that heat normally sets off his asthma.  He does not currently have an inhaler.  He denies any recent coughs or fever.  He denies any chest pain or lower extremity swelling.  Patient has never had to be admitted for his asthma.  Past Medical History:  Diagnosis Date  . Asthma   . Carpal tunnel syndrome 07/03/2016  . Depression 07/03/2016  . Hand laceration 09/01/2018  . Health care maintenance 01/28/2017  . HIV infection ([place])   . Homelessness 09/01/2018  . 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 Left leg pain 07/03/2016  . Rectal pain 12/15/2018  . 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 Work related injury 09/01/2018    Patient Active Problem List   Diagnosis Date Noted  . Rectal pain 12/15/2018  . Hand laceration 09/01/2018  . Work related injury 09/01/2018  . Homelessness 09/01/2018  . Ringworm 08/04/2018  . Callus of foot 06/24/2018  . Health care maintenance 01/28/2017  . Carpal tunnel syndrome 07/03/2016  . Left leg pain 07/03/2016  . Depression 07/03/2016  . 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 HIV disease ([place]) 01/31/2015  . Transgender 01/31/2015    Past Surgical History:  Procedure Laterality Date  . FOOT SURGERY    . NASAL SEPTUM SURGERY          Home Medications    Prior to Admission medications   Medication Sig Start Date End Date Taking? Authorizing Provider  bacitracin ointment Apply 1 application topically 2 (two) times daily. Patient not taking: Reported on 08/30/2018 08/19/18   Antonietta Breach, PA-C   bictegravir-emtricitabine-tenofovir AF (BIKTARVY) 50-200-25 MG TABS tablet Take 1 tablet by mouth daily. 09/01/18   Truman Hayward, MD  doxycycline (VIBRAMYCIN) 100 MG capsule Take 1 capsule (100 mg total) by mouth 2 (two) times daily. 08/22/18   Couture, Cortni S, PA-C  HYDROcodone-acetaminophen (NORCO) 5-325 MG tablet Take 1 tablet by mouth every 4 (four) hours as needed for moderate pain. Patient not taking: Reported on 12/15/2018 10/25/18   Daleen Bo, MD  mupirocin cream (BACTROBAN) 2 % Apply 1 application topically 2 (two) times daily. Patient not taking: Reported on 08/30/2018 07/31/18   Wurst, [place], PA-C  spironolactone (ALDACTONE) 50 MG tablet Take 1 tablet (50 mg total) by mouth daily. 12/15/18   Truman Hayward, MD    Family History Family History  Problem Relation Age of Onset  . Diabetes Mother     Social History Social History   Tobacco Use  . Smoking status: Light Tobacco Smoker    Packs/day: 0.10    Types: Cigarettes  . Smokeless tobacco: Never Used  . Tobacco comment: "2 cigarettes a day"   Substance Use Topics  . Alcohol use: Not Currently    Alcohol/week: 0.0 standard drinks    Comment: occasional  . Drug use: Yes    Types: Marijuana     Allergies   Motrin [ibuprofen], Tramadol, and Tylenol [acetaminophen]   Review of Systems Review of Systems  Constitutional: Negative for fever.  Respiratory: Positive for shortness of breath and wheezing. Negative for cough.   Cardiovascular: Negative for chest pain and leg swelling.  Gastrointestinal: Negative for abdominal pain.  Genitourinary: Negative for dysuria.  All other systems reviewed and are negative.    Physical Exam Updated Vital Signs BP 128/75 (BP Location: Right Arm)   Pulse 84   Temp 98.3 F (36.8 C) (Oral)   Resp 18   Ht 1.702 m (5\' 7" )   Wt 98.4 kg   SpO2 98%   BMI 33.99 kg/m   Physical Exam Vitals signs and nursing note reviewed.  Constitutional:      Appearance:  She is well-developed.     Comments: Overweight, no acute distress  HENT:     Head: Normocephalic and atraumatic.  Eyes:     Pupils: Pupils are equal, round, and reactive to light.  Cardiovascular:     Rate and Rhythm: Normal rate and regular rhythm.     Heart sounds: Normal heart sounds. No murmur.  Pulmonary:     Effort: Pulmonary effort is normal. No respiratory distress.     Breath sounds: Wheezing present.     Comments: Air movement in all lung fields, occasional wheeze noted, no respiratory distress Abdominal:     Palpations: Abdomen is soft.     Tenderness: There is no abdominal tenderness. There is no rebound.  Musculoskeletal:     Right lower leg: No edema.     Left lower leg: No edema.  Skin:    General: Skin is warm and dry.  Neurological:     Mental Status: She is alert and oriented to person, place, and time.  Psychiatric:        Mood and Affect: Mood normal.      ED Treatments / Results  Labs (all labs ordered are listed, but only abnormal results are displayed) Labs Reviewed - No data to display  EKG None  Radiology No results found.  Procedures Procedures (including critical care time)  Medications Ordered in ED Medications  albuterol (VENTOLIN HFA) 108 (90 Base) MCG/ACT inhaler 2 puff (2 puffs Inhalation Given 04/19/19 0009)  dexamethasone (DECADRON) injection 10 mg (10 mg Intramuscular Given 04/19/19 0005)     Initial Impression / Assessment and Plan / ED Course  I have reviewed the triage vital signs and the nursing notes.  Pertinent labs & imaging results that were available during my care of the patient were reviewed by me and considered in my medical decision making (see chart for details).        Patient presents with 2-hour history of shortness of breath.  Reports wheezing.  Does not have an inhaler.  He is overall nontoxic-appearing and vital signs are reassuring.  He is afebrile.  He has good air movement in all lung fields.  Only an  occasional wheeze is heard.  Patient was given Decadron and an inhaler.  He was able to ambulate in the emergency department without respiratory distress and maintaining his pulse ox.  Given good air movement, no fever, no cough, low suspicion for pneumonia.  Will defer x-ray at this time.  Suspect mild intermittent asthma.  After history, exam, and medical workup I feel the patient has been appropriately medically screened and is safe for discharge home. Pertinent diagnoses were discussed with the patient. Patient was given return precautions.   Final Clinical Impressions(s) / ED Diagnoses   Final diagnoses:  Mild intermittent asthma with acute exacerbation  ED Discharge Orders    None       Whitney Bingaman, Mayer Maskerourtney F, MD 04/19/19 [phone]

## 2019-04-19 NOTE — Discharge Instructions (Addendum)
You may use the inhaler provided to you.  Use every 4 hours as needed.

## 2019-04-20 ENCOUNTER — Other Ambulatory Visit: Payer: Medicaid Other

## 2019-04-23 ENCOUNTER — Other Ambulatory Visit: Payer: Self-pay

## 2019-04-23 DIAGNOSIS — Z20822 Contact with and (suspected) exposure to covid-19: Secondary | ICD-10-CM

## 2019-04-26 NOTE — Progress Notes (Signed)
COVID Hotel Screening performed. Temperature, PHQ-9, and medication assessment.   Bernadean Saling MSN, RN 

## 2019-04-30 LAB — NOVEL CORONAVIRUS, NAA: SARS-CoV-2, NAA: NOT DETECTED

## 2019-05-04 ENCOUNTER — Encounter: Payer: Medicaid Other | Admitting: Infectious Disease

## 2019-05-05 ENCOUNTER — Ambulatory Visit: Payer: Medicaid Other | Admitting: Infectious Disease

## 2019-05-05 ENCOUNTER — Encounter: Payer: Self-pay | Admitting: Infectious Disease

## 2019-05-05 DIAGNOSIS — B20 Human immunodeficiency virus [HIV] disease: Secondary | ICD-10-CM

## 2019-05-05 NOTE — Progress Notes (Signed)
Special did not respond to the invite via [email or web address] and did not pick up phone when I called 3 X.

## 2019-05-24 ENCOUNTER — Ambulatory Visit (HOSPITAL_COMMUNITY)
Admission: EM | Admit: 2019-05-24 | Discharge: 2019-05-24 | Disposition: A | Discharge status: Home / Self Care | Payer: Medicaid Other | Attending: Family Medicine | Admitting: Family Medicine

## 2019-05-24 ENCOUNTER — Other Ambulatory Visit: Payer: Self-pay

## 2019-05-24 DIAGNOSIS — K047 Periapical abscess without sinus: Secondary | ICD-10-CM

## 2019-05-24 MED ORDER — IBUPROFEN 800 MG PO TABS: 800.0000 mg | ORAL_TABLET | Freq: Three times a day (TID) | ORAL | 0 refills | Status: DC

## 2019-05-24 MED ORDER — AMOXICILLIN-POT CLAVULANATE 875-125 MG PO TABS: 1.0000 | ORAL_TABLET | Freq: Two times a day (BID) | ORAL | 0 refills | Status: AC

## 2019-05-24 NOTE — ED Triage Notes (Signed)
Per pt started having dental pain that started yesterday on the bottom left tooth. Pt said hard to chew and throbbing.

## 2019-05-24 NOTE — ED Provider Notes (Signed)
[facility]    CSN: [id number] Arrival date & time: 05/24/19  1105      History   Chief Complaint Chief Complaint  Patient presents with  . Dental Pain    HPI Rhonda Ayers is a 38 y.o. adult transgender female who prefers to go by the name special; presenting today for the evaluation of dental pain.  Patient states that last night he began to have pain and swelling to his left lower jaw.  States that he had a posterior molar pulled 4 to 5 weeks ago.  He believes the wrong tooth was pulled and is now having issues with the second to last posterior molar on the right lower jaw.  He has been using ibuprofen 800 which has helped with his symptoms and pain.  Has plans to have this tooth pulled on Wednesday.  Denies fevers.  Denies difficulty swallowing.  Denies neck stiffness or neck pain.  HPI  Past Medical History:  Diagnosis Date  . Asthma   . Carpal tunnel syndrome 07/03/2016  . Depression 07/03/2016  . Hand laceration 09/01/2018  . Health care maintenance 01/28/2017  . HIV infection ([facility])   . Homelessness 09/01/2018  . 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 Left leg pain 07/03/2016  . Rectal pain 12/15/2018  . 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 Work related injury 09/01/2018    Patient Active Problem List   Diagnosis Date Noted  . Rectal pain 12/15/2018  . Hand laceration 09/01/2018  . Work related injury 09/01/2018  . Homelessness 09/01/2018  . Ringworm 08/04/2018  . Callus of foot 06/24/2018  . Health care maintenance 01/28/2017  . Carpal tunnel syndrome 07/03/2016  . Left leg pain 07/03/2016  . Depression 07/03/2016  . 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 HIV disease ([facility]) 01/31/2015  . Transgender 01/31/2015    Past Surgical History:  Procedure Laterality Date  . FOOT SURGERY    . NASAL SEPTUM SURGERY         Home Medications    Prior to Admission medications   Medication Sig Start Date End Date Taking? Authorizing Provider  amoxicillin-clavulanate (AUGMENTIN)  875-125 MG tablet Take 1 tablet by mouth every 12 (twelve) hours for 7 days. 05/24/19 05/31/19  Zelia Yzaguirre C, PA-C  bictegravir-emtricitabine-tenofovir AF (BIKTARVY) 50-200-25 MG TABS tablet Take 1 tablet by mouth daily. 09/01/18   Randall HissVan Dam, Cornelius N, MD  ibuprofen (ADVIL) 800 MG tablet Take 1 tablet (800 mg total) by mouth 3 (three) times daily. 05/24/19   Baldomero Mirarchi C, PA-C  spironolactone (ALDACTONE) 50 MG tablet Take 1 tablet (50 mg total) by mouth daily. 12/15/18   Randall HissVan Dam, Cornelius N, MD    Family History Family History  Problem Relation Age of Onset  . Diabetes Mother     Social History Social History   Tobacco Use  . Smoking status: Light Tobacco Smoker    Packs/day: 0.10    Types: Cigarettes  . Smokeless tobacco: Never Used  . Tobacco comment: "2 cigarettes a day"   Substance Use Topics  . Alcohol use: Not Currently    Alcohol/week: 0.0 standard drinks    Comment: occasional  . Drug use: Yes    Types: Marijuana     Allergies   Tramadol and Tylenol [acetaminophen]   Review of Systems Review of Systems  Constitutional: Negative for activity change, appetite change, chills, fatigue and fever.  HENT: Positive for dental problem and facial swelling. Negative for congestion, ear pain, rhinorrhea, sinus pressure, sore throat and trouble  swallowing.   Eyes: Negative for discharge and redness.  Respiratory: Negative for cough, chest tightness and shortness of breath.   Cardiovascular: Negative for chest pain.  Gastrointestinal: Negative for abdominal pain, diarrhea, nausea and vomiting.  Musculoskeletal: Negative for myalgias.  Skin: Negative for rash.  Neurological: Negative for dizziness, light-headedness and headaches.     Physical Exam Triage Vital Signs ED Triage Vitals  Enc Vitals Group     BP 05/24/19 1122 114/74     Pulse Rate 05/24/19 1122 87     Resp 05/24/19 1122 16     Temp 05/24/19 1122 98 F (36.7 C)     Temp Source 05/24/19 1122 Oral      SpO2 05/24/19 1122 99 %     Weight --      Height --      Head Circumference --      Peak Flow --      Pain Score 05/24/19 1123 5     Pain Loc --      Pain Edu? --      Excl. in GC? --    No data found.  Updated Vital Signs BP 114/74 (BP Location: Right Arm)   Pulse 87   Temp 98 F (36.7 C) (Oral)   Resp 16   SpO2 99%   Visual Acuity Right Eye Distance:   Left Eye Distance:   Bilateral Distance:    Right Eye Near:   Left Eye Near:    Bilateral Near:     Physical Exam Vitals signs and nursing note reviewed.  Constitutional:      Appearance: She is well-developed.  HENT:     Head: Normocephalic and atraumatic.     Comments: Right lower mandibular region with swelling and tenderness to touch, no overlying erythema does not extend into neck    Mouth/Throat:     Comments: Gingival swelling and erythema and tenderness noted to use posterior molar on right lower jaw, no soft palate swelling, uvula midline, posterior pharynx patent Eyes:     Conjunctiva/sclera: Conjunctivae normal.  Neck:     Musculoskeletal: Neck supple.     Comments: No lymphadenopathy or neck swelling, full active range of motion of neck Cardiovascular:     Rate and Rhythm: Normal rate and regular rhythm.     Heart sounds: No murmur.  Pulmonary:     Effort: Pulmonary effort is normal. No respiratory distress.     Breath sounds: Normal breath sounds.  Abdominal:     Palpations: Abdomen is soft.     Tenderness: There is no abdominal tenderness.  Skin:    General: Skin is warm and dry.  Neurological:     Mental Status: She is alert.      UC Treatments / Results  Labs (all labs ordered are listed, but only abnormal results are displayed) Labs Reviewed - No data to display  EKG   Radiology No results found.  Procedures Procedures (including critical care time)  Medications Ordered in UC Medications - No data to display  Initial Impression / Assessment and Plan / UC Course  I have  reviewed the triage vital signs and the nursing notes.  Pertinent labs & imaging results that were available during my care of the patient were reviewed by me and considered in my medical decision making (see chart for details).     Patient appears to have dental abscess.  Will treat with Augmentin twice daily x1 week.  Ibuprofen for pain.  Has nausea  and vomiting with Tylenol.  Discussed risk of ibuprofen in conjunction with Biktarvy.  Advised only to use short-term.  Follow-up with dentistry.  Follow-up in emergency room if symptoms progressing or worsening.Discussed strict return precautions. Patient verbalized understanding and is agreeable with plan.  Final Clinical Impressions(s) / UC Diagnoses   Final diagnoses:  Dental abscess     Discharge Instructions     Begin Augmentin twice daily for the next week.  Please take with food to avoid diarrhea. Please use ibuprofen for pain as needed, do not take more than 1 tablet at a time.  Please stop use after pain resolves.  Taking ibuprofen in combination with Biktarvy does increase risk of kidney damage.  Please only use for short period of time.  Please follow-up if developing increased pain, swelling, fevers, neck stiffness.   ED Prescriptions    Medication Sig Dispense Auth. Provider   amoxicillin-clavulanate (AUGMENTIN) 875-125 MG tablet Take 1 tablet by mouth every 12 (twelve) hours for 7 days. 14 tablet Meighan Treto C, PA-C   ibuprofen (ADVIL) 800 MG tablet Take 1 tablet (800 mg total) by mouth 3 (three) times daily. 21 tablet Jessia Kief, [place] C, PA-C     Controlled Substance Prescriptions [place] Controlled Substance Registry consulted? Not Applicable   Janith Lima, [place] 05/24/19 1139

## 2019-05-24 NOTE — Discharge Instructions (Signed)
Begin Augmentin twice daily for the next week.  Please take with food to avoid diarrhea. Please use ibuprofen for pain as needed, do not take more than 1 tablet at a time.  Please stop use after pain resolves.  Taking ibuprofen in combination with Biktarvy does increase risk of kidney damage.  Please only use for short period of time.  Please follow-up if developing increased pain, swelling, fevers, neck stiffness.

## 2019-05-26 ENCOUNTER — Telehealth: Payer: Self-pay

## 2019-05-26 NOTE — Telephone Encounter (Signed)
Returned patient's call from 8/10; patient had left a message with triage requesting a call back. Patient states she would like to reschedule missed appointment with Dr. Tommy Medal to discuss hormone therapy as well as to have lab work done same day. Patient is scheduled for 8/17 at 230.  [place]

## 2019-06-01 ENCOUNTER — Ambulatory Visit: Payer: Medicaid Other | Admitting: Infectious Disease

## 2019-06-10 ENCOUNTER — Other Ambulatory Visit: Payer: Self-pay

## 2019-06-10 ENCOUNTER — Encounter: Payer: Self-pay | Admitting: Infectious Disease

## 2019-06-10 ENCOUNTER — Ambulatory Visit: Payer: Self-pay

## 2019-06-12 ENCOUNTER — Other Ambulatory Visit: Payer: Self-pay | Admitting: *Deleted

## 2019-06-12 DIAGNOSIS — B20 Human immunodeficiency virus [HIV] disease: Secondary | ICD-10-CM

## 2019-06-17 ENCOUNTER — Other Ambulatory Visit: Payer: Self-pay

## 2019-07-06 ENCOUNTER — Encounter: Payer: Medicaid Other | Admitting: Infectious Disease

## 2019-07-24 ENCOUNTER — Ambulatory Visit (HOSPITAL_COMMUNITY)
Admission: EM | Admit: 2019-07-24 | Discharge: 2019-07-24 | Disposition: A | Discharge status: Home / Self Care | Payer: Self-pay | Attending: Emergency Medicine | Admitting: Emergency Medicine

## 2019-07-24 ENCOUNTER — Encounter (HOSPITAL_COMMUNITY): Payer: Self-pay | Admitting: Emergency Medicine

## 2019-07-24 ENCOUNTER — Ambulatory Visit (INDEPENDENT_AMBULATORY_CARE_PROVIDER_SITE_OTHER): Payer: Self-pay

## 2019-07-24 ENCOUNTER — Other Ambulatory Visit: Payer: Self-pay

## 2019-07-24 DIAGNOSIS — R300 Dysuria: Secondary | ICD-10-CM

## 2019-07-24 DIAGNOSIS — R1032 Left lower quadrant pain: Secondary | ICD-10-CM

## 2019-07-24 LAB — POCT URINALYSIS DIP (DEVICE)
Bilirubin Urine: NEGATIVE
Glucose, UA: NEGATIVE mg/dL
Hgb urine dipstick: NEGATIVE
Ketones, ur: NEGATIVE mg/dL
Leukocytes,Ua: NEGATIVE
Nitrite: NEGATIVE
Protein, ur: NEGATIVE mg/dL
Specific Gravity, Urine: 1.01 (ref 1.005–1.030)
Urobilinogen, UA: 0.2 mg/dL (ref 0.0–1.0)
pH: 7 (ref 5.0–8.0)

## 2019-07-24 MED ORDER — TAMSULOSIN HCL 0.4 MG PO CAPS: 0.4000 mg | ORAL_CAPSULE | Freq: Every day | ORAL | 0 refills | Status: AC

## 2019-07-24 MED ORDER — AMOXICILLIN-POT CLAVULANATE 875-125 MG PO TABS: 1.0000 | ORAL_TABLET | Freq: Three times a day (TID) | ORAL | 0 refills | Status: AC

## 2019-07-24 NOTE — ED Provider Notes (Signed)
HPI  SUBJECTIVE:  Rhonda Ayers is a 38 y.o. adult who presents with 5 days of dysuria, states that she feels like she has to  "push" when she urinates.  She reports sharp, throbbing, intermittent, hours long left back pain that radiates to the flank and suprapubic region.  She thinks that she saw some sediment in her urine.  No urinary urgency, frequency, cloudy odorous urine, hematuria.  No vomiting, fevers.  No penile rash, discharge.  No testicular pain or swelling.  She states this feels identical to previous episodes of nephrolithiasis.  She was seen in the [place] ER for this 3 days ago, had an abdominal x-ray, denies having CT or ultrasound, and was diagnosed with nephrolithiasis.  States that she was not sent home with anything.  Denies rectal, perineal pain.  Last bowel movement was yesterday, did not change her pain.  She tried marijuana, pushing fluids, heat, and drinking tequila.  Heat seems to help.  No aggravating factors.  It is not positional.  She has a past medical history of HIV, states that her viral load is undetectable and her CD4 is "good", asthma, syphilis, nonobstructing nephrolithiasis.  No history of chronic kidney disease, UTI, pyelonephritis, gonorrhea, chlamydia, herpes, diverticulitis, diverticulosis, diabetes, hypertension.  States that she has not been sexually active in 2-1/2 years. She reports hives with ibuprofen.  She denies lip or tongue swelling, difficulty breathing or having to go to the ER for NSAID use.  PMD:Van Dam, Lisette Grinderornelius N, MD   Past Medical History:  Diagnosis Date  . Asthma   . Carpal tunnel syndrome 07/03/2016  . Depression 07/03/2016  . Hand laceration 09/01/2018  . Health care maintenance 01/28/2017  . HIV infection ([facility])   . Homelessness 09/01/2018  . 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 Left leg pain 07/03/2016  . Rectal pain 12/15/2018  . 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 Work related injury 09/01/2018    Past Surgical History:  Procedure Laterality  Date  . FOOT SURGERY    . NASAL SEPTUM SURGERY      Family History  Problem Relation Age of Onset  . Diabetes Mother     Social History   Tobacco Use  . Smoking status: Light Tobacco Smoker    Packs/day: 0.10    Types: Cigarettes  . Smokeless tobacco: Never Used  . Tobacco comment: "2 cigarettes a day"   Substance Use Topics  . Alcohol use: Not Currently    Alcohol/week: 0.0 standard drinks    Comment: occasional  . Drug use: Yes    Types: Marijuana    No current facility-administered medications for this encounter.   Current Outpatient Medications:  .  bictegravir-emtricitabine-tenofovir AF (BIKTARVY) 50-200-25 MG TABS tablet, Take 1 tablet by mouth daily., Disp: 30 tablet, Rfl: 11 .  spironolactone (ALDACTONE) 50 MG tablet, Take 1 tablet (50 mg total) by mouth daily., Disp: 30 tablet, Rfl: 11 .  amoxicillin-clavulanate (AUGMENTIN) 875-125 MG tablet, Take 1 tablet by mouth 3 (three) times daily for 7 days., Disp: 21 tablet, Rfl: 0 .  tamsulosin (FLOMAX) 0.4 MG CAPS capsule, Take 1 capsule (0.4 mg total) by mouth at bedtime for 7 days., Disp: 7 capsule, Rfl: 0  Allergies  Allergen Reactions  . Tramadol Other (See Comments)    "shakes"  . Tylenol [Acetaminophen]      ROS  As noted in HPI.   Physical Exam  BP 112/70 (BP Location: Left Arm)   Pulse 84   Temp 98 F (36.7 C) (Oral)  Resp 18   SpO2 97%   Constitutional: Well developed, well nourished, no acute distress Eyes:  EOMI, conjunctiva normal bilaterally HENT: Normocephalic, atraumatic,mucus membranes moist Respiratory: Normal inspiratory effort Cardiovascular: Normal rate GI: nondistended.  Soft.  Positive left lower quadrant tenderness.  No guarding, rebound.  Active bowel sounds.  Nondistended.  No suprapubic, flank tenderness. Back: No CVAT. Rectal: Refused. skin: No rash, skin intact Musculoskeletal: no deformities Neurologic: Alert & oriented x 3, no focal neuro deficits Psychiatric: Speech  and behavior appropriate   ED Course   Medications - No data to display  Orders Placed This Encounter  Procedures  . DG Abd 1 View    Standing Status:   Standing    Number of Occurrences:   1    Order Specific Question:   Reason for Exam (SYMPTOM  OR DIAGNOSIS REQUIRED)    Answer:   r/o L sided nephrolithiasis  . POCT urinalysis dip (device)    Standing Status:   Standing    Number of Occurrences:   1    Results for orders placed or performed during the hospital encounter of 07/24/19 (from the past 24 hour(s))  POCT urinalysis dip (device)     Status: None   Collection Time: 07/24/19  3:08 PM  Result Value Ref Range   Glucose, UA NEGATIVE NEGATIVE mg/dL   Bilirubin Urine NEGATIVE NEGATIVE   Ketones, ur NEGATIVE NEGATIVE mg/dL   Specific Gravity, Urine 1.010 1.005 - 1.030   Hgb urine dipstick NEGATIVE NEGATIVE   pH 7.0 5.0 - 8.0   Protein, ur NEGATIVE NEGATIVE mg/dL   Urobilinogen, UA 0.2 0.0 - 1.0 mg/dL   Nitrite NEGATIVE NEGATIVE   Leukocytes,Ua NEGATIVE NEGATIVE   Dg Abd 1 View  Result Date: 07/24/2019 CLINICAL DATA:  Flank pain in a patient with a history of kidney stones. EXAM: ABDOMEN - 1 VIEW COMPARISON:  None. FINDINGS: The bowel gas pattern is normal. No radio-opaque calculi or other significant radiographic abnormality are seen. IMPRESSION: Negative exam. Electronically Signed   By: Inge Rise M.D.   On: 07/24/2019 16:06    ED Clinical Impression  1. Left lower quadrant abdominal pain   2. Dysuria      ED Assessment/Plan  In the differential is nephrolithiasis, prostatitis, diverticulitis.  Her urine is negative for hematuria, will get an abdominal x-ray.  She is refusing let me do a prostate exam.  STDs are low in the differential as she states that she has not been sexually active in 2-1/2 years.  Reviewed imaging independently. No radioopaque calculi.  Normal bowel gas pattern.  See radiology report for full details.  We will send home with Flomax  in case this is a stone, as she states that this pain is identical to previous nephrolithiasis, even though there is no hematuria on exam.  Will also send home with Augmentin in case this is diverticulitis.  Tylenol 1 g 4 times daily.  Work note for today.  May return to work tomorrow.  Giving strict ER return precautions.  Discussed labs, imaging, MDM, treatment plan, and plan for follow-up with patient. Discussed sn/sx that should prompt return to the ED. patient agrees with plan.   Meds ordered this encounter  Medications  . amoxicillin-clavulanate (AUGMENTIN) 875-125 MG tablet    Sig: Take 1 tablet by mouth 3 (three) times daily for 7 days.    Dispense:  21 tablet    Refill:  0  . tamsulosin (FLOMAX) 0.4 MG CAPS capsule  Sig: Take 1 capsule (0.4 mg total) by mouth at bedtime for 7 days.    Dispense:  7 capsule    Refill:  0    *This clinic note was created using Scientist, clinical (histocompatibility and immunogenetics). Therefore, there may be occasional mistakes despite careful proofreading.   ?    Domenick Gong, MD 07/25/19 [phone]

## 2019-07-24 NOTE — Discharge Instructions (Addendum)
There is no evidence of a stone on x-ray, however, you can sometimes you cannot see stones on x-ray.  I am going to treat this as if this is a stone with Flomax.  This will help you pass a stone.  You may take 1 g of Tylenol 3 or 4 times a day as needed for pain.  I am going to send you home on Augmentin to treat possible diverticulitis.  Go immediately to the ER if your abdominal pain changes, gets worse, is not controlled with the Tylenol, fevers above 100.4, or for other concerns.

## 2019-07-24 NOTE — ED Triage Notes (Addendum)
Pt reports left flank pain that radiates into her left abdomen.  She reports a history of kidney stones and states this is the same pain.  Pt identifies as female.

## 2019-09-17 ENCOUNTER — Ambulatory Visit (HOSPITAL_COMMUNITY)
Admission: EM | Admit: 2019-09-17 | Discharge: 2019-09-17 | Disposition: A | Discharge status: Home / Self Care | Payer: Medicaid Other | Attending: Internal Medicine | Admitting: Internal Medicine

## 2019-09-17 ENCOUNTER — Other Ambulatory Visit: Payer: Self-pay

## 2019-09-17 ENCOUNTER — Encounter (HOSPITAL_COMMUNITY): Payer: Self-pay

## 2019-09-17 ENCOUNTER — Other Ambulatory Visit: Payer: Self-pay | Admitting: Infectious Disease

## 2019-09-17 DIAGNOSIS — B20 Human immunodeficiency virus [HIV] disease: Secondary | ICD-10-CM

## 2019-09-17 DIAGNOSIS — J029 Acute pharyngitis, unspecified: Secondary | ICD-10-CM

## 2019-09-17 LAB — POCT RAPID STREP A: Streptococcus, Group A Screen (Direct): NEGATIVE

## 2019-09-17 NOTE — ED Provider Notes (Signed)
[facility]    CSN: [id number] Arrival date & time: 09/17/19  1701      History   Chief Complaint Chief Complaint  Patient presents with  . Sore Throat    HPI Rhonda Ayers is a 38 y.o. adult with a history of HIV on Biktarvy comes to urgent care with complaints of sore throat which started earlier today.  Patient denies any difficulty swallowing.  No fever or chills.  No shortness of breath.  Patient tested negative for COVID-19 last week.  He denies any sick contacts.  No nausea, vomiting or diarrhea.  No loss of taste or smell.  Patient has not tried any over-the-counter medications.  No known aggravating symptoms.   HPI  Past Medical History:  Diagnosis Date  . Asthma   . Carpal tunnel syndrome 07/03/2016  . Depression 07/03/2016  . Hand laceration 09/01/2018  . Health care maintenance 01/28/2017  . HIV infection (HCC)   . Homelessness 09/01/2018  . 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 Left leg pain 07/03/2016  . Rectal pain 12/15/2018  . 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 Work related injury 09/01/2018    Patient Active Problem List   Diagnosis Date Noted  . Rectal pain 12/15/2018  . Hand laceration 09/01/2018  . Work related injury 09/01/2018  . Homelessness 09/01/2018  . Ringworm 08/04/2018  . Callus of foot 06/24/2018  . Health care maintenance 01/28/2017  . Carpal tunnel syndrome 07/03/2016  . Left leg pain 07/03/2016  . Depression 07/03/2016  . 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 HIV disease (HCC) 01/31/2015  . Transgender 01/31/2015    Past Surgical History:  Procedure Laterality Date  . FOOT SURGERY    . NASAL SEPTUM SURGERY         Home Medications    Prior to Admission medications   Medication Sig Start Date End Date Taking? Authorizing Provider  BIKTARVY 50-200-25 MG TABS tablet TAKE 1 TABLET BY MOUTH DAILY 09/17/19   Daiva Eves, Lisette Grinder, MD  spironolactone (ALDACTONE) 50 MG tablet Take 1 tablet (50 mg total) by mouth daily. 12/15/18   Randall Hiss, MD    Family History Family History  Problem Relation Age of Onset  . Diabetes Mother     Social History Social History   Tobacco Use  . Smoking status: Light Tobacco Smoker    Packs/day: 0.10    Types: Cigarettes  . Smokeless tobacco: Never Used  . Tobacco comment: "2 cigarettes a day"   Substance Use Topics  . Alcohol use: Not Currently    Alcohol/week: 0.0 standard drinks    Comment: occasional  . Drug use: Yes    Types: Marijuana     Allergies   Tramadol and Tylenol [acetaminophen]   Review of Systems Review of Systems  Constitutional: Negative for activity change, chills, fatigue and fever.  HENT: Positive for sore throat. Negative for congestion, postnasal drip, rhinorrhea, sinus pressure and sinus pain.   Eyes: Negative for pain, discharge and redness.  Respiratory: Negative.   Cardiovascular: Negative.   Gastrointestinal: Negative for nausea and vomiting.  Endocrine: Negative.   Genitourinary: Negative for dysuria, frequency and urgency.  Musculoskeletal: Negative for myalgias and neck pain.  Neurological: Negative for dizziness, weakness and headaches.     Physical Exam Triage Vital Signs ED Triage Vitals  Enc Vitals Group     BP      Pulse      Resp      Temp  Temp src      SpO2      Weight      Height      Head Circumference      Peak Flow      Pain Score      Pain Loc      Pain Edu?      Excl. in [place]?    No data found.  Updated Vital Signs BP 124/77 (BP Location: Left Arm)   Pulse 72   Temp 98.1 F (36.7 C) (Oral)   Resp 18   SpO2 96%   Visual Acuity Right Eye Distance:   Left Eye Distance:   Bilateral Distance:    Right Eye Near:   Left Eye Near:    Bilateral Near:     Physical Exam Constitutional:      General: She is not in acute distress.    Appearance: She is well-developed. She is not ill-appearing, toxic-appearing or diaphoretic.  HENT:     Right Ear: Tympanic membrane normal.     Left Ear:  Tympanic membrane normal.     Mouth/Throat:     Mouth: Mucous membranes are moist. No oral lesions.     Pharynx: Posterior oropharyngeal erythema present. No pharyngeal swelling, oropharyngeal exudate or uvula swelling.     Tonsils: No tonsillar exudate or tonsillar abscesses. 1+ on the right. 1+ on the left.  Eyes:     Conjunctiva/sclera: Conjunctivae normal.     Pupils: Pupils are equal, round, and reactive to light.  Neck:     Musculoskeletal: Normal range of motion and neck supple.     Thyroid: No thyromegaly.  Cardiovascular:     Rate and Rhythm: Normal rate and regular rhythm.     Heart sounds: Normal heart sounds. No murmur. No friction rub.  Pulmonary:     Effort: Pulmonary effort is normal. No respiratory distress.     Breath sounds: No rhonchi or rales.  Abdominal:     General: Bowel sounds are normal. There is no distension.     Palpations: Abdomen is soft.     Tenderness: There is no guarding or rebound.  Lymphadenopathy:     Cervical: No cervical adenopathy.  Skin:    Capillary Refill: Capillary refill takes less than 2 seconds.  Neurological:     General: No focal deficit present.     Mental Status: She is alert.      UC Treatments / Results  Labs (all labs ordered are listed, but only abnormal results are displayed) Labs Reviewed  POCT RAPID STREP A    EKG   Radiology No results found.  Procedures Procedures (including critical care time)  Medications Ordered in UC Medications - No data to display  Initial Impression / Assessment and Plan / UC Course  I have reviewed the triage vital signs and the nursing notes.  Pertinent labs & imaging results that were available during my care of the patient were reviewed by me and considered in my medical decision making (see chart for details).     1.  Acute viral pharyngitis: Rapid strep is negative Recent COVID-19 test was negative Throat culture If patient symptoms worsens he is advised to return to  urgent care to be reevaluated. Final Clinical Impressions(s) / UC Diagnoses   Final diagnoses:  Viral pharyngitis   Discharge Instructions   None    ED Prescriptions    None     PDMP not reviewed this encounter.   Lamptey, Myrene Galas, MD  09/17/19 1847  

## 2019-09-17 NOTE — ED Triage Notes (Signed)
Pt presents with sore throat since earlier today.

## 2019-09-20 LAB — CULTURE, GROUP A STREP (THRC)

## 2019-09-21 ENCOUNTER — Other Ambulatory Visit: Payer: Medicaid Other

## 2019-10-21 ENCOUNTER — Encounter: Payer: Medicaid Other | Admitting: Infectious Disease

## 2019-10-26 ENCOUNTER — Ambulatory Visit: Payer: Self-pay | Admitting: Infectious Disease

## 2019-12-17 ENCOUNTER — Telehealth: Payer: Self-pay

## 2019-12-17 NOTE — Telephone Encounter (Signed)
Patient reports that she has called the office numerous times to let us know that she has relocated. Requested assistance locating a new infectious disease doctor in [place]. Instructed the patient to look online for numbers of ID clinics and call to see if they're accepting new patients. Stated if she needed any assistance we'd be happy to help by sending her records to them. Patient reports not feeling well and that her body just "isn't right" and hasn't been for quite some time. Has not seen a primary doctor or provider. Instructed patient to try and schedule an appointment at an ID clinic but if she's unable to wait, to visit an urgent care for assessment. Patient verbalized understanding.   Copper Kirtley Loyola Mast, RN

## 2020-01-10 ENCOUNTER — Other Ambulatory Visit: Payer: Self-pay | Admitting: Infectious Disease

## 2020-01-11 ENCOUNTER — Telehealth: Payer: Self-pay

## 2020-01-11 NOTE — Telephone Encounter (Signed)
Received refill request for Spirolactone; per last phone note patient has moved and is transferring care to local ID clinic. Attempted to call patient to see if she has established care yet. If so she will need to have new provider take over prescriptions.  Lorenso Courier, [place]

## 2020-07-21 ENCOUNTER — Telehealth: Payer: Self-pay | Admitting: *Deleted

## 2020-07-21 NOTE — Telephone Encounter (Signed)
Rhonda Ayers is in [place] for 2 weeks, has decided she is moving back. She never transferred care in [place], said there were problems getting her records so she could get an appointment. She would like to resume care at [facility].  She has been off medication for 1 month, but said the [place] office gave her a sample of Biktarvy and she found some expired Atripla to get her through.  Last [facility] visit was 04/2019, ADAP expired 12/2019.  RN set Rhonda Ayers up for financial counseling and pharmacy visit to restart medication. She is in the process of finding a place to stay and changing her job back to [employer], but will come in 10/14. Andree Coss, RN

## 2020-07-27 ENCOUNTER — Ambulatory Visit (INDEPENDENT_AMBULATORY_CARE_PROVIDER_SITE_OTHER): Payer: Self-pay | Admitting: Pharmacist

## 2020-07-27 ENCOUNTER — Other Ambulatory Visit (HOSPITAL_COMMUNITY)
Admission: RE | Admit: 2020-07-27 | Discharge: 2020-07-27 | Disposition: A | Discharge status: Home / Self Care | Payer: Medicaid Other | Source: Ambulatory Visit | Attending: Infectious Disease | Admitting: Infectious Disease

## 2020-07-27 ENCOUNTER — Ambulatory Visit: Payer: Self-pay

## 2020-07-27 ENCOUNTER — Telehealth: Payer: Self-pay

## 2020-07-27 ENCOUNTER — Other Ambulatory Visit: Payer: Self-pay

## 2020-07-27 DIAGNOSIS — B20 Human immunodeficiency virus [HIV] disease: Secondary | ICD-10-CM | POA: Insufficient documentation

## 2020-07-27 DIAGNOSIS — Z113 Encounter for screening for infections with a predominantly sexual mode of transmission: Secondary | ICD-10-CM

## 2020-07-27 MED ORDER — BIKTARVY 50-200-25 MG PO TABS: 1.0000 | ORAL_TABLET | Freq: Every day | ORAL | 5 refills | Status: DC

## 2020-07-27 NOTE — Telephone Encounter (Signed)
[facility] Patient Advocate Encounter  Completed and sent [employer] Advancing Access application for BIKTARVY for this patient who is uninsured.    Patient is approved 07/27/20 through 08/27/20.         Clearance Coots, CPhT Specialty Pharmacy Patient [facility] Phone: [phone] Fax:  [phone]

## 2020-07-27 NOTE — Progress Notes (Signed)
HPI: Rhonda Ayers is a 39 y.o. adult who presents to the [place] clinic for HIV follow-up.  Patient Active Problem List   Diagnosis Date Noted  . Rectal pain 12/15/2018  . Hand laceration 09/01/2018  . Work related injury 09/01/2018  . Homelessness 09/01/2018  . Ringworm 08/04/2018  . Callus of foot 06/24/2018  . Health care maintenance 01/28/2017  . Carpal tunnel syndrome 07/03/2016  . Left leg pain 07/03/2016  . Depression 07/03/2016  . 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 HIV disease ([place]) 01/31/2015  . Transgender 01/31/2015    Patient's Medications  New Prescriptions   No medications on file  Previous Medications   BIKTARVY 50-200-25 MG TABS TABLET    TAKE 1 TABLET BY MOUTH DAILY   SPIRONOLACTONE (ALDACTONE) 50 MG TABLET    Take 1 tablet (50 mg total) by mouth daily.  Modified Medications   No medications on file  Discontinued Medications   No medications on file    Allergies: Allergies  Allergen Reactions  . Tramadol Other (See Comments)    "shakes"  . Tylenol [Acetaminophen]     Past Medical History: Past Medical History:  Diagnosis Date  . Asthma   . Carpal tunnel syndrome 07/03/2016  . Depression 07/03/2016  . Hand laceration 09/01/2018  . Health care maintenance 01/28/2017  . HIV infection ([place])   . Homelessness 09/01/2018  . 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 Left leg pain 07/03/2016  . Rectal pain 12/15/2018  . 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 Work related injury 09/01/2018    Social History: Social History   Socioeconomic History  . Marital status: Single    Spouse name: Not on file  . Number of children: Not on file  . Years of education: Not on file  . Highest education level: Not on file  Occupational History  . Not on file  Tobacco Use  . Smoking status: Light Tobacco Smoker    Packs/day: 0.10    Types: Cigarettes  . Smokeless tobacco: Never Used  . Tobacco comment: "2 cigarettes a day"   Vaping Use  . Vaping Use: Never used  Substance and  Sexual Activity  . Alcohol use: Not Currently    Alcohol/week: 0.0 standard drinks    Comment: occasional  . Drug use: Yes    Types: Marijuana  . Sexual activity: Never    Partners: Male    Birth control/protection: Condom    Comment: given condoms  Other Topics Concern  . Not on file  Social History Narrative  . Not on file   Social Determinants of Health   Financial Resource Strain:   . Difficulty of Paying Living Expenses: Not on file  Food Insecurity:   . Worried About Charity fundraiser in the Last Year: Not on file  . Ran Out of Food in the Last Year: Not on file  Transportation Needs:   . Lack of Transportation (Medical): Not on file  . Lack of Transportation (Non-Medical): Not on file  Physical Activity:   . Days of Exercise per Week: Not on file  . Minutes of Exercise per Session: Not on file  Stress:   . Feeling of Stress : Not on file  Social Connections:   . Frequency of Communication with Friends and Family: Not on file  . Frequency of Social Gatherings with Friends and Family: Not on file  . Attends Religious Services: Not on file  . Active Member of Clubs or Organizations: Not on file  . Attends  Club or Organization Meetings: Not on file  . Marital Status: Not on file    Labs: Lab Results  Component Value Date   HIV1RNAQUANT <20 NOT DETECTED 11/21/2018   HIV1RNAQUANT <20 DETECTED 09/01/2018   HIV1RNAQUANT 451 (H) 08/04/2018   CD4TABS 460 11/21/2018   CD4TABS 660 08/04/2018   CD4TABS 520 06/11/2018    RPR and STI Lab Results  Component Value Date   LABRPR REACTIVE (A) 11/21/2018   LABRPR REACTIVE (A) 08/04/2018   LABRPR REACTIVE (A) 06/11/2018   LABRPR REACTIVE (A) 01/07/2018   LABRPR REACTIVE (A) 08/05/2017   RPRTITER 1:4 (H) 11/21/2018   RPRTITER 1:2 (H) 08/04/2018   RPRTITER 1:1 (H) 06/11/2018   RPRTITER 1:2 (H) 01/07/2018   RPRTITER 1:8 (H) 08/05/2017    STI Results GC CT  12/15/2018 Negative Negative  12/15/2018 Negative Negative    12/15/2018 Negative Negative  08/04/2018 Negative Negative  06/11/2018 Negative Negative  01/07/2018 Negative Negative  08/05/2017 Negative **POSITIVE**(A)  08/05/2017 Negative Negative  08/05/2017 Negative Negative  01/28/2017 **POSITIVE**(A) **POSITIVE**(A)  01/28/2017 Negative Negative  07/03/2016 Negative Negative  03/07/2016 Negative* Negative*  03/07/2016 Negative* Negative*  03/07/2016 Negative Negative  12/21/2014 NG: Negative CT: Negative    Hepatitis B Lab Results  Component Value Date   HEPBSAB NEG 12/21/2014   HEPBSAG NEGATIVE 12/21/2014   HEPBCAB NON REACTIVE 12/21/2014   Hepatitis C No results found for: HEPCAB, HCVRNAPCRQN Hepatitis A Lab Results  Component Value Date   HAV REACTIVE (A) 12/21/2014   Lipids: Lab Results  Component Value Date   CHOL 189 08/04/2018   TRIG 143 08/04/2018   HDL 37 (L) 08/04/2018   CHOLHDL 5.1 (H) 08/04/2018   VLDL 28 12/12/2016   LDLCALC 126 (H) 08/04/2018    Current HIV Regimen: Biktarvy  Assessment: Rhonda Ayers is here today to re-engage in care after moving to [place] last year. She states that she could never get her care situated at the clinic in [place] and recently moved back to [place] to help take care of her mother. She has been out of [place] for 19 days and is very anxious to start taking it again. She has no issues today but would like to be fully checked for STDs, which I will order. We were able to get her an immediate 30 day supply of Biktarvy and she met with Timmothy Sours today to renew her [place]ley Noon. Will update labs today and have her see Dr. Tommy Medal in ~4 weeks.  She had her COVID vaccine back in August - Moderna. She declined her flu shot today but made an appointment to come back tomorrow to get it with our nursing staff.  Plan: - Restart Biktarvy - 30 day immediate fill - HIV viral load, CD4, CBC, CMET, urine/rectal cytology, RPR today - F/u with Dr. Tommy Medal 11/15 at 345pm  Rhonda Ayers L. Rhonda Ayers, PharmD,  BCIDP, AAHIVP, CPP Clinical Pharmacist Practitioner Infectious Diseases [place] [facility] 07/27/2020, 4:13 PM

## 2020-07-28 ENCOUNTER — Ambulatory Visit: Payer: Medicaid Other

## 2020-07-28 ENCOUNTER — Encounter: Payer: Self-pay | Admitting: Infectious Disease

## 2020-07-28 ENCOUNTER — Ambulatory Visit: Payer: Medicaid Other | Admitting: Pharmacist

## 2020-07-28 ENCOUNTER — Telehealth: Payer: Self-pay | Admitting: Pharmacist

## 2020-07-28 ENCOUNTER — Ambulatory Visit: Payer: Self-pay

## 2020-07-28 LAB — CYTOLOGY, (ORAL, ANAL, URETHRAL) ANCILLARY ONLY
Chlamydia: NEGATIVE
Comment: NEGATIVE
Comment: NORMAL
Neisseria Gonorrhea: POSITIVE — AB

## 2020-07-28 LAB — T-HELPER CELL (CD4) - (RCID CLINIC ONLY)
CD4 % Helper T Cell: 33 % (ref 33–65)
CD4 T Cell Abs: 476 /uL (ref 400–1790)

## 2020-07-28 LAB — URINE CYTOLOGY ANCILLARY ONLY
Chlamydia: NEGATIVE
Comment: NEGATIVE
Comment: NORMAL
Neisseria Gonorrhea: NEGATIVE

## 2020-07-28 NOTE — Telephone Encounter (Signed)
Saw patient yesterday in clinic to re-engage her into care. Her rectal swab returned positive with gonorrhea. Called to discuss. She will come in tomorrow morning to get ceftriaxone 500 mg IM x 1. No allergies to antibiotics. Told her to abstain from sexual activity for 10 days after treatment and to alert her partners to get tested/treated.  She states she has some moles/warts inside her rectum that she wants Dr. Daiva Eves to take a look at when she sees him on 11/15.

## 2020-07-28 NOTE — Telephone Encounter (Signed)
Thanks Cassie 

## 2020-07-29 ENCOUNTER — Ambulatory Visit: Payer: Self-pay

## 2020-07-30 LAB — COMPREHENSIVE METABOLIC PANEL
AG Ratio: 1.3 (calc) (ref 1.0–2.5)
ALT: 12 U/L (ref 9–46)
AST: 24 U/L (ref 10–40)
Albumin: 4.3 g/dL (ref 3.6–5.1)
Alkaline phosphatase (APISO): 73 U/L (ref 36–130)
BUN: 8 mg/dL (ref 7–25)
CO2: 28 mmol/L (ref 20–32)
Calcium: 9.8 mg/dL (ref 8.6–10.3)
Chloride: 104 mmol/L (ref 98–110)
Creat: 1.03 mg/dL (ref 0.60–1.35)
Globulin: 3.2 g/dL (calc) (ref 1.9–3.7)
Glucose, Bld: 94 mg/dL (ref 65–99)
Potassium: 4.6 mmol/L (ref 3.5–5.3)
Sodium: 139 mmol/L (ref 135–146)
Total Bilirubin: 0.3 mg/dL (ref 0.2–1.2)
Total Protein: 7.5 g/dL (ref 6.1–8.1)

## 2020-07-30 LAB — CBC
HCT: 52.8 % — ABNORMAL HIGH (ref 38.5–50.0)
Hemoglobin: 17.8 g/dL — ABNORMAL HIGH (ref 13.2–17.1)
MCH: 30.2 pg (ref 27.0–33.0)
MCHC: 33.7 g/dL (ref 32.0–36.0)
MCV: 89.6 fL (ref 80.0–100.0)
MPV: 10.3 fL (ref 7.5–12.5)
Platelets: 234 10*3/uL (ref 140–400)
RBC: 5.89 10*6/uL — ABNORMAL HIGH (ref 4.20–5.80)
RDW: 12.9 % (ref 11.0–15.0)
WBC: 3.1 10*3/uL — ABNORMAL LOW (ref 3.8–10.8)

## 2020-07-30 LAB — RPR: RPR Ser Ql: REACTIVE — AB

## 2020-07-30 LAB — HIV-1 RNA QUANT-NO REFLEX-BLD
HIV 1 RNA Quant: 642 Copies/mL — ABNORMAL HIGH
HIV-1 RNA Quant, Log: 2.81 Log cps/mL — ABNORMAL HIGH

## 2020-07-30 LAB — FLUORESCENT TREPONEMAL AB(FTA)-IGG-BLD: Fluorescent Treponemal ABS: REACTIVE — AB

## 2020-07-30 LAB — RPR TITER: RPR Titer: 1:1 {titer} — ABNORMAL HIGH

## 2020-08-01 ENCOUNTER — Other Ambulatory Visit: Payer: Self-pay

## 2020-08-01 ENCOUNTER — Ambulatory Visit (INDEPENDENT_AMBULATORY_CARE_PROVIDER_SITE_OTHER): Payer: Self-pay | Admitting: *Deleted

## 2020-08-01 DIAGNOSIS — A549 Gonococcal infection, unspecified: Secondary | ICD-10-CM

## 2020-08-01 MED ORDER — CEFTRIAXONE SODIUM 500 MG IJ SOLR
500.0000 mg | Freq: Once | INTRAMUSCULAR | Status: AC
  Administered 2020-08-01: 500 mg via INTRAMUSCULAR

## 2020-08-01 NOTE — Progress Notes (Signed)
RN offered condoms, advised patient to remain abstinent for 7-10 days after treatment.   Patient states she has not been sexually active in 3+ years, but keeps coming up positive when her rectum is swabbed for gonorrhea. She is concerned that perhaps it is not fully treated. Will forward her concerns to Dr Daiva Eves to see how to proceed. Andree Coss, RN

## 2020-08-02 NOTE — Progress Notes (Signed)
Her partners have probably not been adequetely treated

## 2020-08-02 NOTE — Progress Notes (Signed)
She states she has had ZERO partners in more than 3 years.

## 2020-08-26 ENCOUNTER — Other Ambulatory Visit: Payer: Self-pay

## 2020-08-26 ENCOUNTER — Ambulatory Visit (INDEPENDENT_AMBULATORY_CARE_PROVIDER_SITE_OTHER): Payer: Self-pay

## 2020-08-26 DIAGNOSIS — Z23 Encounter for immunization: Secondary | ICD-10-CM

## 2020-08-26 NOTE — Progress Notes (Signed)
   Covid-19 Vaccination Clinic  Name:  [name]    MRN: [id number] DOB: [birth year]  08/26/2020  [name] was observed post Covid-19 immunization for 15 minutes  without incident. She was provided with Vaccine Information Sheet and instruction to access the V-Safe system.   [name] was instructed to call 911 with any severe reactions post vaccine: Marland Kitchen Difficulty breathing  . Swelling of face and throat  . A fast heartbeat  . A bad rash all over body  . Dizziness and weakness   Immunizations Administered    Name Date Dose VIS Date Route   Pfizer COVID-19 Vaccine 08/26/2020 11:50 AM 0.3 mL 08/03/2020 Intramuscular   Manufacturer: [employer], [employer]   Lot: [id number]   NDC: [address]-3

## 2020-08-29 ENCOUNTER — Ambulatory Visit: Payer: Self-pay | Admitting: Infectious Disease

## 2020-08-31 ENCOUNTER — Encounter: Payer: Self-pay | Admitting: Infectious Disease

## 2020-08-31 ENCOUNTER — Ambulatory Visit: Payer: Self-pay | Admitting: Infectious Disease

## 2020-08-31 ENCOUNTER — Ambulatory Visit (INDEPENDENT_AMBULATORY_CARE_PROVIDER_SITE_OTHER): Payer: Self-pay | Admitting: Infectious Disease

## 2020-08-31 ENCOUNTER — Other Ambulatory Visit: Payer: Self-pay

## 2020-08-31 VITALS — BP 129/71 | HR 88 | Temp 98.2°F | Wt 214.0 lb

## 2020-08-31 DIAGNOSIS — A630 Anogenital (venereal) warts: Secondary | ICD-10-CM

## 2020-08-31 DIAGNOSIS — Z789 Other specified health status: Secondary | ICD-10-CM

## 2020-08-31 DIAGNOSIS — A549 Gonococcal infection, unspecified: Secondary | ICD-10-CM

## 2020-08-31 DIAGNOSIS — B20 Human immunodeficiency virus [HIV] disease: Secondary | ICD-10-CM

## 2020-08-31 DIAGNOSIS — A529 Late syphilis, unspecified: Secondary | ICD-10-CM

## 2020-08-31 HISTORY — DX: Gonococcal infection, unspecified: A54.9

## 2020-08-31 HISTORY — DX: Anogenital (venereal) warts: A63.0

## 2020-08-31 MED ORDER — ESTRADIOL CYPIONATE 5 MG/ML IM OIL: 2.5000 mg | TOPICAL_OIL | INTRAMUSCULAR | 11 refills | Status: DC

## 2020-08-31 MED ORDER — BIKTARVY 50-200-25 MG PO TABS: 1.0000 | ORAL_TABLET | Freq: Every day | ORAL | 5 refills | Status: DC

## 2020-08-31 MED ORDER — SPIRONOLACTONE 100 MG PO TABS: 150.0000 mg | ORAL_TABLET | Freq: Every day | ORAL | 0 refills | Status: DC
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
## 2020-09-02 LAB — HIV-1 RNA QUANT-NO REFLEX-BLD
HIV 1 RNA Quant: <20 Copies/mL
HIV-1 RNA Quant, Log: <1.3 Log cps/mL

## 2020-09-14 ENCOUNTER — Ambulatory Visit: Payer: Self-pay

## 2020-09-14 ENCOUNTER — Other Ambulatory Visit: Payer: Self-pay

## 2020-09-15 ENCOUNTER — Ambulatory Visit: Payer: Medicaid Other

## 2020-09-15 ENCOUNTER — Other Ambulatory Visit: Payer: Medicaid Other

## 2020-09-16 ENCOUNTER — Ambulatory Visit: Payer: Self-pay

## 2020-09-25 ENCOUNTER — Encounter (HOSPITAL_COMMUNITY): Payer: Self-pay | Admitting: Emergency Medicine

## 2020-09-25 ENCOUNTER — Ambulatory Visit (HOSPITAL_COMMUNITY)
Admission: EM | Admit: 2020-09-25 | Discharge: 2020-09-25 | Disposition: A | Discharge status: Home / Self Care | Payer: Medicaid Other

## 2020-09-25 ENCOUNTER — Other Ambulatory Visit: Payer: Self-pay

## 2020-09-25 DIAGNOSIS — M7918 Myalgia, other site: Secondary | ICD-10-CM

## 2020-09-25 NOTE — Discharge Instructions (Signed)
Please return for any worsening pain. Continue motrin as needed for pain. Heating pad and warm soaks for sore muscles.
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
## 2020-10-04 ENCOUNTER — Other Ambulatory Visit: Payer: Self-pay | Admitting: Infectious Disease

## 2020-10-05 ENCOUNTER — Ambulatory Visit: Payer: Self-pay | Admitting: Infectious Disease

## 2020-10-17 ENCOUNTER — Ambulatory Visit: Payer: Medicaid Other

## 2020-10-17 ENCOUNTER — Ambulatory Visit: Payer: Medicaid Other | Admitting: Infectious Disease

## 2020-10-19 ENCOUNTER — Telehealth: Payer: Self-pay

## 2020-10-19 NOTE — Telephone Encounter (Signed)
Patient complains of feeling fatigue, night sweats, cold, cough. Denies any sense of loss of taste or smell. Patient states she was tested on 12/29 in [place] area was reports Negative results. Patient requesting to be seen in the office today. LPN consults with Dr. Daiva Eves and requests that patient not come to the office and be retested for Covid via community testing sites. Patient offered evisit, but she declined and agrees to be retested. Patient advised to quarantine while waiting on results. Patient also advised to continue to stay hydrated, and continue to manage symptoms with OTC tylenol if needed, cough medication. Patient appreciative of advise. Rhonda Ayers

## 2020-10-19 NOTE — Telephone Encounter (Signed)
Thanks Shaquenia!

## 2020-10-21 ENCOUNTER — Ambulatory Visit: Payer: Medicaid Other

## 2020-10-27 ENCOUNTER — Other Ambulatory Visit (HOSPITAL_COMMUNITY)
Admission: RE | Admit: 2020-10-27 | Discharge: 2020-10-27 | Disposition: A | Discharge status: Home / Self Care | Payer: Medicaid Other | Source: Ambulatory Visit | Attending: Infectious Diseases | Admitting: Infectious Diseases

## 2020-10-27 ENCOUNTER — Ambulatory Visit: Payer: Medicaid Other | Admitting: Infectious Diseases

## 2020-10-27 ENCOUNTER — Ambulatory Visit (INDEPENDENT_AMBULATORY_CARE_PROVIDER_SITE_OTHER): Payer: Self-pay | Admitting: Infectious Diseases

## 2020-10-27 ENCOUNTER — Other Ambulatory Visit: Payer: Self-pay

## 2020-10-27 VITALS — Wt 205.0 lb

## 2020-10-27 DIAGNOSIS — Z113 Encounter for screening for infections with a predominantly sexual mode of transmission: Secondary | ICD-10-CM | POA: Diagnosis present

## 2020-10-27 DIAGNOSIS — A549 Gonococcal infection, unspecified: Secondary | ICD-10-CM | POA: Diagnosis present

## 2020-10-27 DIAGNOSIS — B20 Human immunodeficiency virus [HIV] disease: Secondary | ICD-10-CM

## 2020-10-27 DIAGNOSIS — Z23 Encounter for immunization: Secondary | ICD-10-CM

## 2020-10-27 MED ORDER — CEFTRIAXONE SODIUM 250 MG IJ SOLR
500.0000 mg | Freq: Once | INTRAMUSCULAR | Status: AC
  Administered 2020-10-27: 500 mg via INTRAMUSCULAR

## 2020-10-27 MED ORDER — AZITHROMYCIN 250 MG PO TABS
1000.0000 mg | ORAL_TABLET | Freq: Once | ORAL | Status: AC
  Administered 2020-10-27: 1000 mg via ORAL

## 2020-10-27 MED ORDER — AZITHROMYCIN 250 MG PO TABS: 2000.0000 mg | ORAL_TABLET | Freq: Once | ORAL | Status: DC

## 2020-10-27 NOTE — Progress Notes (Signed)
RN confirmed allergies with patient, allergic to tramadol and Tylenol. Patient tolerated ceftriaxone and azithromycin well. Reinforced abstinence for 10 days after treatment, offered condoms and encouraged use. Patient verbalized understanding.   Sandie Ano, RN

## 2020-10-27 NOTE — Assessment & Plan Note (Signed)
Will recheck urine G/C., RPR.  Have asked lab to do Gonnorrhea Cx, for sensi Will check UCx as well IM Cectriaxone, po azithro.  Has f/u appt with Dr Daiva Eves 1-19 per pt.

## 2020-10-27 NOTE — Addendum Note (Signed)
Addended by: Linna Hoff D on: 10/27/2020 04:46 PM   Modules accepted: Orders

## 2020-10-27 NOTE — Assessment & Plan Note (Signed)
Labs reviewed Adherent to meds States not sexually active.  Needs PCV23 and COVID #2 Will also check UA, UCx
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
## 2020-10-28 LAB — URINE CYTOLOGY ANCILLARY ONLY
Chlamydia: NEGATIVE
Comment: NEGATIVE
Comment: NORMAL
Neisseria Gonorrhea: NEGATIVE

## 2020-10-29 LAB — URINALYSIS, ROUTINE W REFLEX MICROSCOPIC
Bacteria, UA: NONE SEEN /HPF
Bilirubin Urine: NEGATIVE
Glucose, UA: NEGATIVE
Nitrite: NEGATIVE
Specific Gravity, Urine: 1.03 (ref 1.001–1.03)
Squamous Epithelial / LPF: NONE SEEN /HPF (ref <=5)
WBC, UA: >=60 /HPF — AB (ref 0–5)
pH: <=5 (ref 5.0–8.0)

## 2020-10-29 LAB — RPR: RPR Ser Ql: REACTIVE — AB

## 2020-10-29 LAB — URINE CULTURE
MICRO NUMBER:: 11415564
SPECIMEN QUALITY:: ADEQUATE

## 2020-10-29 LAB — FLUORESCENT TREPONEMAL AB(FTA)-IGG-BLD: Fluorescent Treponemal ABS: REACTIVE — AB

## 2020-10-29 LAB — RPR TITER: RPR Titer: 1:2 {titer} — ABNORMAL HIGH

## 2020-10-31 LAB — GONOCOCCUS CULTURE
MICRO NUMBER:: 11416342
SPECIMEN QUALITY:: ADEQUATE

## 2020-11-01 ENCOUNTER — Telehealth: Payer: Self-pay | Admitting: Infectious Diseases

## 2020-11-01 DIAGNOSIS — N3000 Acute cystitis without hematuria: Secondary | ICD-10-CM

## 2020-11-01 MED ORDER — SULFAMETHOXAZOLE-TRIMETHOPRIM 800-160 MG PO TABS: 1.0000 | ORAL_TABLET | Freq: Two times a day (BID) | ORAL | 0 refills | Status: DC

## 2020-11-01 NOTE — Telephone Encounter (Signed)
I didn't know special was having uti six?

## 2020-11-01 NOTE — Telephone Encounter (Signed)
Called pt since UCx was + E coli.  Will give 5 days of bactrim. No recollection of taking prior.  No back or flank pain, no dysuria. Feels better.  reviewed labs with them.

## 2020-11-02 NOTE — Telephone Encounter (Signed)
Saw them emergently on 1-13. Special was worried they had GC

## 2020-11-02 NOTE — Telephone Encounter (Signed)
Yes

## 2020-11-02 NOTE — Telephone Encounter (Signed)
I am hearing it still may be she. Thanks for seeing her/them!

## 2020-11-02 NOTE — Telephone Encounter (Signed)
Gotcha thanks Trey Paula and they is Specials pronoun now too?

## 2020-11-21 ENCOUNTER — Ambulatory Visit: Payer: Self-pay | Admitting: Infectious Disease

## 2020-12-07 ENCOUNTER — Encounter (HOSPITAL_COMMUNITY): Payer: Self-pay | Admitting: Emergency Medicine

## 2020-12-07 ENCOUNTER — Other Ambulatory Visit: Payer: Self-pay

## 2020-12-07 ENCOUNTER — Ambulatory Visit (HOSPITAL_COMMUNITY)
Admission: EM | Admit: 2020-12-07 | Discharge: 2020-12-07 | Disposition: A | Discharge status: Home / Self Care | Payer: Medicaid Other | Attending: Urgent Care | Admitting: Urgent Care

## 2020-12-07 DIAGNOSIS — Z711 Person with feared health complaint in whom no diagnosis is made: Secondary | ICD-10-CM

## 2020-12-07 MED ORDER — TIZANIDINE HCL 4 MG PO TABS: 4.0000 mg | ORAL_TABLET | Freq: Three times a day (TID) | ORAL | 0 refills | Status: DC | PRN

## 2020-12-07 MED ORDER — NAPROXEN 375 MG PO TABS: 375.0000 mg | ORAL_TABLET | Freq: Two times a day (BID) | ORAL | 0 refills | Status: DC

## 2020-12-07 MED ORDER — IBUPROFEN 800 MG PO TABS: 800.0000 mg | ORAL_TABLET | Freq: Once | ORAL | Status: DC
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
## 2020-12-16 ENCOUNTER — Telehealth: Payer: Self-pay

## 2020-12-16 NOTE — Telephone Encounter (Signed)
Patient called to see when last CD4 lab was done. Advised it was done 4 months ago in October.  Patient scheduled to come in on 12/20/20 and will request another CD4 at that visit. Expressed gratitude and excitement for being a patient at [facility].

## 2020-12-20 ENCOUNTER — Ambulatory Visit: Payer: Self-pay | Admitting: Infectious Diseases

## 2020-12-23 ENCOUNTER — Ambulatory Visit (INDEPENDENT_AMBULATORY_CARE_PROVIDER_SITE_OTHER): Payer: Self-pay | Admitting: Infectious Diseases

## 2020-12-23 ENCOUNTER — Other Ambulatory Visit: Payer: Self-pay

## 2020-12-23 ENCOUNTER — Encounter: Payer: Self-pay | Admitting: Infectious Diseases

## 2020-12-23 VITALS — BP 125/79 | HR 80 | Wt 208.0 lb

## 2020-12-23 DIAGNOSIS — J029 Acute pharyngitis, unspecified: Secondary | ICD-10-CM

## 2020-12-23 DIAGNOSIS — B20 Human immunodeficiency virus [HIV] disease: Secondary | ICD-10-CM

## 2020-12-23 DIAGNOSIS — Z789 Other specified health status: Secondary | ICD-10-CM

## 2020-12-23 MED ORDER — LIDOCAINE VISCOUS HCL 2 % MT SOLN: 15.0000 mL | OROMUCOSAL | 0 refills | Status: DC | PRN

## 2020-12-23 NOTE — Patient Instructions (Addendum)
Please stop by the lab on your way out.   Great news is it does not look like you have strep throat.  I suspect that you have this due to seasonal allergies (pollen, grass changes).   I want you to try taking an over the counter antihistamine (either Zyrtec, Claritin or Allegra) once a day to see if this helps. Can use benadryl at night to help more with the drainage.   Pain reliving mouth rinse has been sent to your pharmacy.   Continue to use warm salt water gargles to help as well.   Follow up with Dr. Daiva Eves to discuss your hormone plan

## 2020-12-23 NOTE — Assessment & Plan Note (Signed)
New problem that started overnight. No exudate or lymphadenopathy to suspect strep throat.  She is worried that strep throat runs in her family. No viral symptoms otherwise. Suspect allergies playing a role vs irritation from food debris.   Will start antihistamine daily, salt water rinses, benadryl at night PRN if needed. Cautioned to not ingest hydrogen peroxide.  Lidocaine mouth rinse for pain.

## 2020-12-23 NOTE — Progress Notes (Signed)
Subjective:    Patient ID: Rhonda Ayers, adult    DOB: Nov 26, 1980, 40 y.o.   MRN: [id number]  CC:  Sore throat since last night.  Wants to talk about next steps for her hormones.  Breast tenderness.    HPI:  Special is a 40 y.o. adult with well controlled HIV disease last VL < 20 and CD4 476 currently maintained on once daily Biktarvy.   Noticed a sore throat last night. She gargled with hyrogen peroxide once last night and again this morning without improvement. She does notice some drainage in the back of her throat, mostly at night. No cough, runny nose, watery eyes or fevers. Feels like it is more sore on the right side of her throat. Not severe. No oral sex "in a long time."   She is taking Estradiol cypionate injections 5 mg Q28d and Spironolactone 100 mg QD. No problems with side effects. Does notice some tenderness and swelling on both sides of her breasts. Wants to know if she should avoid lifting anything heavy (works at [employer]).     Allergies  Allergen Reactions  . Tramadol Other (See Comments)    "shakes"  . Tylenol [Acetaminophen]       Outpatient Medications Prior to Visit  Medication Sig Dispense Refill  . bictegravir-emtricitabine-tenofovir AF (BIKTARVY) 50-200-25 MG TABS tablet Take 1 tablet by mouth daily. 30 tablet 5  . estradiol cypionate (DEPO-ESTRADIOL) 5 MG/ML injection Inject 0.5 mLs (2.5 mg total) into the muscle every 28 (twenty-eight) days. 10 mL 11  . spironolactone (ALDACTONE) 100 MG tablet Take 1.5 tablets (150 mg total) by mouth daily. 45 tablet 0  . naproxen (NAPROSYN) 375 MG tablet Take 1 tablet (375 mg total) by mouth 2 (two) times daily with a meal. 30 tablet 0  . tiZANidine (ZANAFLEX) 4 MG tablet Take 1 tablet (4 mg total) by mouth every 8 (eight) hours as needed. 30 tablet 0   No facility-administered medications prior to visit.     Past Medical History:  Diagnosis Date  . Asthma   . Carpal tunnel syndrome 07/03/2016  . Depression  07/03/2016  . Gonorrhea 08/31/2020  . Hand laceration 09/01/2018  . Health care maintenance 01/28/2017  . HIV infection (HCC)   . Homelessness 09/01/2018  . 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 Left leg pain 07/03/2016  . Rectal pain 12/15/2018  . 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 Work related injury 09/01/2018      Past Surgical History:  Procedure Laterality Date  . FOOT SURGERY    . NASAL SEPTUM SURGERY        Review of Systems  Constitutional: Negative for chills, fatigue and fever.  HENT: Positive for sore throat. Negative for congestion, mouth sores, rhinorrhea, sinus pressure, sinus pain and trouble swallowing.   Eyes: Negative for visual disturbance.  Respiratory: Negative for cough.   Gastrointestinal: Negative for abdominal pain, anal bleeding and rectal pain.  Genitourinary: Negative for dysuria, genital sores, penile discharge, penile pain, scrotal swelling and testicular pain.  Musculoskeletal: Negative for arthralgias and joint swelling.  Skin: Negative for rash.  Neurological: Negative for headaches.  Hematological: Negative for adenopathy.        Objective:    BP 125/79   Pulse 80   Wt 208 lb (94.3 kg)   BMI 32.58 kg/m  Nursing note and vital signs reviewed.  Physical Exam Vitals reviewed.  Constitutional:      Appearance: Normal appearance.  She is not ill-appearing.  HENT:     Head: Normocephalic.     Nose: Nose normal. No congestion or rhinorrhea.     Mouth/Throat:     Mouth: Mucous membranes are moist.     Pharynx: Oropharynx is clear. Posterior oropharyngeal erythema (Some mild erythema noted to posterior oropharyngeal space. No exudate. Some food debris noted in the right tonsil. ) present.  Eyes:     Conjunctiva/sclera: Conjunctivae normal.  Chest:     Comments: Early breast development noted vs gynecomastia from spiro. Some tenderness to palpation. Normal an symmetrical in appearance. No  nodules/masses. No nipple discharge.  Musculoskeletal:     Cervical back: Normal range of motion.  Lymphadenopathy:     Cervical: No cervical adenopathy.  Skin:    General: Skin is warm and dry.  Neurological:     Mental Status: She is alert and oriented to person, place, and time.  Psychiatric:        Mood and Affect: Mood normal.         Assessment & Plan:   Patient Active Problem List   Diagnosis Date Noted  . Sore throat 12/23/2020  . Gonorrhea 08/31/2020  . 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 Hand laceration 09/01/2018  . Homelessness 09/01/2018  . Health care maintenance 01/28/2017  . Carpal tunnel syndrome 07/03/2016  . Depression 07/03/2016  . 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 HIV disease (HCC) 01/31/2015  . Female-to-female transgender person 01/31/2015    Problem List Items Addressed This Visit      Unprioritized   Sore throat - Primary    New problem that started overnight. No exudate or lymphadenopathy to suspect strep throat.  She is worried that strep throat runs in her family. No viral symptoms otherwise. Suspect allergies playing a role vs irritation from food debris.   Will start antihistamine daily, salt water rinses, benadryl at night PRN if needed. Cautioned to not ingest hydrogen peroxide.  Lidocaine mouth rinse for pain.       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 Discussed that there is no concern for any unexpected process and that this will continue through the next 2-3 years on gender affirming hormone therapy.  She has no lifting restrictions.  She will follow up with Dr. Daiva Eves at next scheduled appointment to discuss this further about the plan for treatment.       HIV disease (HCC)    Requesting her CD4  count to be done today. Will order.  Follow up with Dr. Daiva Eves as scheduled.       Relevant Orders   T-helper cells (CD4) count (not at [facility])        I am having Kristalynn D. Thiem "Special" start on lidocaine. I am also having her maintain her spironolactone, Biktarvy, estradiol cypionate, tiZANidine, and naproxen.   Meds ordered this encounter  Medications  . lidocaine (XYLOCAINE) 2 % solution    Sig: Use as directed 15 mLs in the mouth or throat as needed for mouth pain.    Dispense:  100 mL    Refill:  0    Order Specific Question:   Supervising Provider    Answer:   Gardiner Barefoot [[id number]]    RTC as scheduled with Dr. Daiva Eves  Rexene Alberts, MSN, NP-C [facility]  [name] Pager: [phone] Office: [phone] [facility] Main Line: [phone]

## 2020-12-23 NOTE — Assessment & Plan Note (Addendum)
She has early breast development since starting injectable estradiol vs gynecomastia from spironolactone. Discussed that there is no concern for any unexpected process and that this will continue through the next 2-3 years on gender affirming hormone therapy.  She has no lifting restrictions.  She will follow up with Dr. Daiva Eves at next scheduled appointment to discuss this further about the plan for treatment.

## 2020-12-23 NOTE — Assessment & Plan Note (Signed)
Requesting her CD4 count to be done today. Will order.  Follow up with Dr. Daiva Eves as scheduled.

## 2020-12-24 ENCOUNTER — Other Ambulatory Visit: Payer: Self-pay

## 2020-12-24 ENCOUNTER — Ambulatory Visit (HOSPITAL_COMMUNITY)
Admission: EM | Admit: 2020-12-24 | Discharge: 2020-12-24 | Discharge status: Absent without leave | Payer: Medicaid Other

## 2020-12-26 LAB — CULTURE, GROUP A STREP
MICRO NUMBER:: 11638972
SPECIMEN QUALITY:: ADEQUATE

## 2020-12-26 LAB — T-HELPER CELLS (CD4) COUNT (NOT AT ARMC)
Absolute CD4: 524 cells/uL (ref 490–1740)
CD4 T Helper %: 33 % (ref 30–61)
Total lymphocyte count: 1572 cells/uL (ref 850–3900)

## 2020-12-27 ENCOUNTER — Telehealth: Payer: Self-pay

## 2020-12-27 NOTE — Telephone Encounter (Signed)
-----   Message from Blanchard Kelch, NP sent at 12/27/2020 11:28 AM EDT ----- Please call Special to let her know that the strep throat test was negative. Her CD4 count is in the normal range at 524. Hopeful she is feeling better on her allergy medicine.

## 2020-12-27 NOTE — Telephone Encounter (Signed)
I called patient to relay lab results. Patient did not answer and a voicemail was left for patient to call back.  Rhonda Ayers

## 2020-12-28 ENCOUNTER — Telehealth: Payer: Self-pay

## 2020-12-28 NOTE — Telephone Encounter (Signed)
-----   Message from Stephanie N [name], NP sent at 12/27/2020 11:28 AM EDT ----- Please call Special to let her know that the strep throat test was negative. Her CD4 count is in the normal range at 524. Hopeful she is feeling better on her allergy medicine.   

## 2020-12-28 NOTE — Telephone Encounter (Signed)
I spoke with patient and relayed lab results to the patient.  Patient stated she is feeling a lot better. Patient has no questions or concerns.

## 2021-01-10 ENCOUNTER — Ambulatory Visit: Payer: Medicaid Other

## 2021-01-17 ENCOUNTER — Ambulatory Visit: Payer: Self-pay | Admitting: Infectious Disease

## 2021-01-18 ENCOUNTER — Ambulatory Visit: Payer: Medicaid Other | Admitting: Infectious Disease

## 2021-01-20 ENCOUNTER — Ambulatory Visit: Payer: Medicaid Other | Admitting: Infectious Disease

## 2021-01-21 ENCOUNTER — Other Ambulatory Visit: Payer: Self-pay

## 2021-01-21 ENCOUNTER — Encounter (HOSPITAL_COMMUNITY): Payer: Self-pay | Admitting: Emergency Medicine

## 2021-01-21 ENCOUNTER — Ambulatory Visit (HOSPITAL_COMMUNITY)
Admission: EM | Admit: 2021-01-21 | Discharge: 2021-01-21 | Disposition: A | Discharge status: Home / Self Care | Payer: Self-pay | Attending: Student | Admitting: Student

## 2021-01-21 DIAGNOSIS — J020 Streptococcal pharyngitis: Secondary | ICD-10-CM

## 2021-01-21 DIAGNOSIS — B20 Human immunodeficiency virus [HIV] disease: Secondary | ICD-10-CM

## 2021-01-21 DIAGNOSIS — J029 Acute pharyngitis, unspecified: Secondary | ICD-10-CM

## 2021-01-21 LAB — POCT RAPID STREP A, ED / UC: Streptococcus, Group A Screen (Direct): POSITIVE — AB

## 2021-01-21 MED ORDER — LIDOCAINE VISCOUS HCL 2 % MT SOLN: 15.0000 mL | OROMUCOSAL | 0 refills | Status: DC | PRN

## 2021-01-21 MED ORDER — IBUPROFEN 400 MG PO TABS: 400.0000 mg | ORAL_TABLET | Freq: Three times a day (TID) | ORAL | 0 refills | Status: DC

## 2021-01-21 MED ORDER — AMOXICILLIN-POT CLAVULANATE 875-125 MG PO TABS: 1.0000 | ORAL_TABLET | Freq: Two times a day (BID) | ORAL | 0 refills | Status: DC

## 2021-01-21 NOTE — Discharge Instructions (Addendum)
-  Start the antibiotic-Augmentin (amoxicillin-clavulanate), twice daily for 7 days.  You  can take this with food if you have a sensitive stomach. -You will still be contagious for 24 hours after starting the antibiotic. -Seek additional medical attention if your symptoms worsen or persist despite treatment, including sore throat, or new symptoms like fever/chills, trouble swallowing, shortness of breath, dizziness, chest pain. -Ibuprofen 400mg  sent. Avoid other NSAIDs while taking this medcation. Take with food.  -For sore throat, use lidocaine mouthwash up to every 4 hours. Make sure not to eat for at least 1 hour after using this, as your mouth will be very numb and you could bite yourself.

## 2021-01-21 NOTE — ED Triage Notes (Signed)
Pt presents today with c/o of sore throat x 2 days. Denies fever.

## 2021-01-21 NOTE — ED Provider Notes (Signed)
[facility]    CSN: [id number] Arrival date & time: 01/21/21  1200      History   Chief Complaint Chief Complaint  Patient presents with  . Sore Throat    HPI Rhonda Ayers is a 40 y.o. adult presenting for sore throat.  This patient has female gender identity and uses pronouns she/her.  Medical history includes HIV with nondetectable T-cell count, gonorrhea, syphilis.  Notes few weeks of sore throat.  Was seen by infectious disease for this 1 month ago and was started on lidocaine mouthwash and antihistamine; she has not started the antihistamine and states that the lidocaine provided minimal relief.  Also with some nasal congestion and postnasal drip and right-sided neck lymph node pain.  States she is not sexually active.  HPI  Past Medical History:  Diagnosis Date  . Asthma   . Carpal tunnel syndrome 07/03/2016  . Depression 07/03/2016  . Gonorrhea 08/31/2020  . Hand laceration 09/01/2018  . Health care maintenance 01/28/2017  . HIV infection (HCC)   . Homelessness 09/01/2018  . 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 Left leg pain 07/03/2016  . Rectal pain 12/15/2018  . 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 Work related injury 09/01/2018    Patient Active Problem List   Diagnosis Date Noted  . Sore throat 12/23/2020  . Gonorrhea 08/31/2020  . 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 Hand laceration 09/01/2018  . Homelessness 09/01/2018  . Health care maintenance 01/28/2017  . Carpal tunnel syndrome 07/03/2016  . Depression 07/03/2016  . 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 HIV disease (HCC) 01/31/2015  . Female-to-female transgender person 01/31/2015    Past Surgical History:  Procedure Laterality Date  . FOOT SURGERY    . NASAL SEPTUM SURGERY         Home Medications    Prior to Admission medications   Medication Sig Start Date End Date Taking? Authorizing Provider   amoxicillin-clavulanate (AUGMENTIN) 875-125 MG tablet Take 1 tablet by mouth every 12 (twelve) hours. 01/21/21  Yes Rhys Martini, PA-C  bictegravir-emtricitabine-tenofovir AF (BIKTARVY) 50-200-25 MG TABS tablet Take 1 tablet by mouth daily. 08/31/20  Yes Daiva Eves, Lisette Grinder, MD  estradiol cypionate (DEPO-ESTRADIOL) 5 MG/ML injection Inject 0.5 mLs (2.5 mg total) into the muscle every 28 (twenty-eight) days. 08/31/20  Yes Daiva Eves, Lisette Grinder, MD  spironolactone (ALDACTONE) 100 MG tablet Take 1.5 tablets (150 mg total) by mouth daily. 08/31/20  Yes Daiva Eves, Lisette Grinder, MD  lidocaine (XYLOCAINE) 2 % solution Use as directed 15 mLs in the mouth or throat as needed for mouth pain. 12/23/20   Blanchard Kelch, NP  naproxen (NAPROSYN) 375 MG tablet Take 1 tablet (375 mg total) by mouth 2 (two) times daily with a meal. 12/07/20   Wallis Bamberg, PA-C  tiZANidine (ZANAFLEX) 4 MG tablet Take 1 tablet (4 mg total) by mouth every 8 (eight) hours as needed. 12/07/20   Wallis Bamberg, PA-C    Family History Family History  Problem Relation Age of Onset  . Diabetes Mother     Social History Social History   Tobacco Use  . Smoking status: Light Tobacco Smoker    Packs/day: 0.10    Types: Cigarettes  . Smokeless tobacco: Never Used  . Tobacco comment: "2 cigarettes a day"   Vaping Use  . Vaping Use: Never used  Substance Use Topics  . Alcohol use: Not Currently    Alcohol/week:  0.0 standard drinks    Comment: occasional  . Drug use: Yes    Types: Marijuana     Allergies   Tramadol and Tylenol [acetaminophen]   Review of Systems Review of Systems  Constitutional: Negative for appetite change, chills and fever.  HENT: Positive for sore throat. Negative for congestion, ear pain, rhinorrhea, sinus pressure and sinus pain.   Eyes: Negative for redness and visual disturbance.  Respiratory: Negative for cough, chest tightness, shortness of breath and wheezing.   Cardiovascular: Negative for chest  pain and palpitations.  Gastrointestinal: Negative for abdominal pain, constipation, diarrhea, nausea and vomiting.  Genitourinary: Negative for dysuria, frequency and urgency.  Musculoskeletal: Negative for myalgias.  Neurological: Negative for dizziness, weakness and headaches.  Psychiatric/Behavioral: Negative for confusion.  All other systems reviewed and are negative.    Physical Exam Triage Vital Signs ED Triage Vitals  Enc Vitals Group     BP      Pulse      Resp      Temp      Temp src      SpO2      Weight      Height      Head Circumference      Peak Flow      Pain Score      Pain Loc      Pain Edu?      Excl. in GC?    No data found.  Updated Vital Signs BP 123/85 (BP Location: Right Arm)   Pulse 87   Temp 99.5 F (37.5 C) (Oral)   Resp 18   SpO2 97%   Visual Acuity Right Eye Distance:   Left Eye Distance:   Bilateral Distance:    Right Eye Near:   Left Eye Near:    Bilateral Near:     Physical Exam Vitals reviewed.  Constitutional:      General: She is not in acute distress.    Appearance: Normal appearance. She is not ill-appearing.  HENT:     Head: Normocephalic and atraumatic.     Right Ear: Hearing, tympanic membrane, ear canal and external ear normal. No swelling or tenderness. There is no impacted cerumen. No mastoid tenderness. Tympanic membrane is not perforated, erythematous, retracted or bulging.     Left Ear: Hearing, tympanic membrane, ear canal and external ear normal. No swelling or tenderness. There is no impacted cerumen. No mastoid tenderness. Tympanic membrane is not perforated, erythematous, retracted or bulging.     Nose:     Right Sinus: No maxillary sinus tenderness or frontal sinus tenderness.     Left Sinus: No maxillary sinus tenderness or frontal sinus tenderness.     Mouth/Throat:     Mouth: Mucous membranes are moist.     Pharynx: Uvula midline. No oropharyngeal exudate or posterior oropharyngeal erythema.      Tonsils: Tonsillar exudate present. 2+ on the right. 2+ on the left.     Comments: On exam she is tolerating her secretions without difficulty, there is no trismus, no drooling, she has normal phonation  Cardiovascular:     Rate and Rhythm: Normal rate and regular rhythm.     Heart sounds: Normal heart sounds.  Pulmonary:     Breath sounds: Normal breath sounds and air entry. No wheezing, rhonchi or rales.  Chest:     Chest wall: No tenderness.  Abdominal:     General: Abdomen is flat. Bowel sounds are normal.     Tenderness:  There is no abdominal tenderness. There is no guarding or rebound.  Lymphadenopathy:     Cervical: No cervical adenopathy.  Neurological:     General: No focal deficit present.     Mental Status: She is alert and oriented to person, place, and time.  Psychiatric:        Attention and Perception: Attention and perception normal.        Mood and Affect: Mood and affect normal.        Behavior: Behavior normal. Behavior is cooperative.        Thought Content: Thought content normal.        Judgment: Judgment normal.      UC Treatments / Results  Labs (all labs ordered are listed, but only abnormal results are displayed) Labs Reviewed  POCT RAPID STREP A, ED / UC - Abnormal; Notable for the following components:      Result Value   Streptococcus, Group A Screen (Direct) POSITIVE (*)    All other components within normal limits    EKG   Radiology No results found.  Procedures Procedures (including critical care time)  Medications Ordered in UC Medications - No data to display  Initial Impression / Assessment and Plan / UC Course  I have reviewed the triage vital signs and the nursing notes.  Pertinent labs & imaging results that were available during my care of the patient were reviewed by me and considered in my medical decision making (see chart for details).     This patient is a 40 year old presenting with strep pharyngitis. Today this pt  is afebrile nontachycardic nontachypneic, oxygenating well on room air, no wheezes rhonchi or rales. Hasn't taken antipyretic today. This patient has history of HIV, currently with nondetectable t-cell count. Rapid strep positive.  Augmentin sent. Ibuprofen and viscous lidocaine also sent for symptomatic relief. ED return precautions discussed.  Final Clinical Impressions(s) / UC Diagnoses   Final diagnoses:  Strep pharyngitis  HIV disease (HCC)     Discharge Instructions     -Start the antibiotic-Augmentin (amoxicillin-clavulanate), twice daily for 7 days.  You  can take this with food if you have a sensitive stomach. -You will still be contagious for 24 hours after starting the antibiotic. -Seek additional medical attention if your symptoms worsen or persist despite treatment, including sore throat, or new symptoms like fever/chills, trouble swallowing, shortness of breath, dizziness, chest pain.    ED Prescriptions    Medication Sig Dispense Auth. Provider   amoxicillin-clavulanate (AUGMENTIN) 875-125 MG tablet Take 1 tablet by mouth every 12 (twelve) hours. 14 tablet Rhys Martini, PA-C     PDMP not reviewed this encounter.   Rhys Martini, PA-C 01/21/21 1355

## 2021-02-01 ENCOUNTER — Other Ambulatory Visit: Payer: Self-pay

## 2021-02-01 ENCOUNTER — Ambulatory Visit: Payer: Self-pay

## 2021-02-01 DIAGNOSIS — B20 Human immunodeficiency virus [HIV] disease: Secondary | ICD-10-CM

## 2021-02-02 LAB — BASIC METABOLIC PANEL WITH GFR
BUN: 14 mg/dL (ref 7–25)
CO2: 29 mmol/L (ref 20–32)
Calcium: 9.5 mg/dL (ref 8.6–10.3)
Chloride: 101 mmol/L (ref 98–110)
Creat: 0.94 mg/dL (ref 0.60–1.35)
GFR, Est African American: 120 mL/min/{1.73_m2} (ref >=60)
GFR, Est Non African American: 103 mL/min/{1.73_m2} (ref >=60)
Glucose, Bld: 101 mg/dL — ABNORMAL HIGH (ref 65–99)
Potassium: 4.5 mmol/L (ref 3.5–5.3)
Sodium: 138 mmol/L (ref 135–146)

## 2021-02-03 ENCOUNTER — Encounter: Payer: Self-pay | Admitting: Infectious Diseases

## 2021-02-16 ENCOUNTER — Ambulatory Visit: Payer: Medicaid Other | Admitting: Infectious Diseases

## 2021-02-23 ENCOUNTER — Ambulatory Visit: Payer: Medicaid Other | Admitting: Infectious Diseases

## 2021-02-24 ENCOUNTER — Ambulatory Visit: Payer: Medicaid Other | Admitting: Infectious Diseases

## 2021-03-12 ENCOUNTER — Emergency Department (HOSPITAL_COMMUNITY): Payer: Medicaid Other

## 2021-03-12 ENCOUNTER — Encounter (HOSPITAL_COMMUNITY): Payer: Self-pay

## 2021-03-12 ENCOUNTER — Emergency Department (HOSPITAL_COMMUNITY)
Admission: EM | Admit: 2021-03-12 | Discharge: 2021-03-12 | Disposition: A | Discharge status: Home / Self Care | Payer: Medicaid Other | Attending: Emergency Medicine | Admitting: Emergency Medicine

## 2021-03-12 ENCOUNTER — Other Ambulatory Visit: Payer: Self-pay

## 2021-03-12 DIAGNOSIS — R0602 Shortness of breath: Secondary | ICD-10-CM | POA: Insufficient documentation

## 2021-03-12 DIAGNOSIS — F17210 Nicotine dependence, cigarettes, uncomplicated: Secondary | ICD-10-CM | POA: Insufficient documentation

## 2021-03-12 DIAGNOSIS — J45909 Unspecified asthma, uncomplicated: Secondary | ICD-10-CM | POA: Insufficient documentation

## 2021-03-12 DIAGNOSIS — R1013 Epigastric pain: Secondary | ICD-10-CM | POA: Insufficient documentation

## 2021-03-12 DIAGNOSIS — E876 Hypokalemia: Secondary | ICD-10-CM | POA: Insufficient documentation

## 2021-03-12 DIAGNOSIS — Z21 Asymptomatic human immunodeficiency virus [HIV] infection status: Secondary | ICD-10-CM | POA: Insufficient documentation

## 2021-03-12 DIAGNOSIS — R079 Chest pain, unspecified: Secondary | ICD-10-CM | POA: Insufficient documentation

## 2021-03-12 LAB — CBC WITH DIFFERENTIAL/PLATELET
Abs Immature Granulocytes: 0.01 10*3/uL (ref 0.00–0.07)
Basophils Absolute: 0 10*3/uL (ref 0.0–0.1)
Basophils Relative: 1 %
Eosinophils Absolute: 0 10*3/uL (ref 0.0–0.5)
Eosinophils Relative: 1 %
HCT: 45.8 % (ref 39.0–52.0)
Hemoglobin: 15.6 g/dL (ref 13.0–17.0)
Immature Granulocytes: 0 %
Lymphocytes Relative: 40 %
Lymphs Abs: 1.8 10*3/uL (ref 0.7–4.0)
MCH: 30.6 pg (ref 26.0–34.0)
MCHC: 34.1 g/dL (ref 30.0–36.0)
MCV: 89.8 fL (ref 80.0–100.0)
Monocytes Absolute: 0.3 10*3/uL (ref 0.1–1.0)
Monocytes Relative: 7 %
Neutro Abs: 2.3 10*3/uL (ref 1.7–7.7)
Neutrophils Relative %: 51 %
Platelets: 213 10*3/uL (ref 150–400)
RBC: 5.1 MIL/uL (ref 4.22–5.81)
RDW: 12.9 % (ref 11.5–15.5)
WBC: 4.4 10*3/uL (ref 4.0–10.5)
nRBC: 0 % (ref 0.0–0.2)

## 2021-03-12 LAB — COMPREHENSIVE METABOLIC PANEL
ALT: 18 U/L (ref 0–44)
AST: 27 U/L (ref 15–41)
Albumin: 4 g/dL (ref 3.5–5.0)
Alkaline Phosphatase: 54 U/L (ref 38–126)
Anion gap: 11 (ref 5–15)
BUN: 10 mg/dL (ref 6–20)
CO2: 22 mmol/L (ref 22–32)
Calcium: 9.1 mg/dL (ref 8.9–10.3)
Chloride: 99 mmol/L (ref 98–111)
Creatinine, Ser: 0.82 mg/dL (ref 0.61–1.24)
GFR, Estimated: >60 mL/min (ref >60)
Glucose, Bld: 127 mg/dL — ABNORMAL HIGH (ref 70–99)
Potassium: 2.8 mmol/L — ABNORMAL LOW (ref 3.5–5.1)
Sodium: 132 mmol/L — ABNORMAL LOW (ref 135–145)
Total Bilirubin: 0.5 mg/dL (ref 0.3–1.2)
Total Protein: 7.2 g/dL (ref 6.5–8.1)

## 2021-03-12 LAB — RAPID URINE DRUG SCREEN, HOSP PERFORMED
Amphetamines: NOT DETECTED
Barbiturates: NOT DETECTED
Benzodiazepines: NOT DETECTED
Cocaine: POSITIVE — AB
Opiates: NOT DETECTED
Tetrahydrocannabinol: POSITIVE — AB

## 2021-03-12 LAB — TROPONIN I (HIGH SENSITIVITY)
Troponin I (High Sensitivity): 6 ng/L (ref <18)
Troponin I (High Sensitivity): 7 ng/L (ref <18)

## 2021-03-12 LAB — LIPASE, BLOOD: Lipase: 33 U/L (ref 11–51)

## 2021-03-12 MED ORDER — SUCRALFATE 1 GM/10ML PO SUSP: 1.0000 g | Freq: Three times a day (TID) | ORAL | 0 refills | Status: DC

## 2021-03-12 MED ORDER — SODIUM CHLORIDE 0.9 % IV BOLUS
1000.0000 mL | Freq: Once | INTRAVENOUS | Status: AC
  Administered 2021-03-12: 1000 mL via INTRAVENOUS

## 2021-03-12 MED ORDER — LIDOCAINE VISCOUS HCL 2 % MT SOLN
15.0000 mL | Freq: Once | OROMUCOSAL | Status: AC
  Administered 2021-03-12: 15 mL via ORAL
  Filled 2021-03-12: qty 15

## 2021-03-12 MED ORDER — ONDANSETRON HCL 4 MG/2ML IJ SOLN
4.0000 mg | Freq: Once | INTRAMUSCULAR | Status: AC
  Administered 2021-03-12: 4 mg via INTRAVENOUS
  Filled 2021-03-12: qty 2

## 2021-03-12 MED ORDER — PANTOPRAZOLE SODIUM 40 MG IV SOLR
40.0000 mg | Freq: Once | INTRAVENOUS | Status: AC
  Administered 2021-03-12: 40 mg via INTRAVENOUS
  Filled 2021-03-12: qty 40

## 2021-03-12 MED ORDER — ALUM & MAG HYDROXIDE-SIMETH 200-200-20 MG/5ML PO SUSP
30.0000 mL | Freq: Once | ORAL | Status: AC
  Administered 2021-03-12: 30 mL via ORAL
  Filled 2021-03-12: qty 30

## 2021-03-12 MED ORDER — POTASSIUM CHLORIDE CRYS ER 20 MEQ PO TBCR
60.0000 meq | EXTENDED_RELEASE_TABLET | Freq: Once | ORAL | Status: AC
  Administered 2021-03-12: 60 meq via ORAL
  Filled 2021-03-12: qty 3

## 2021-03-12 MED ORDER — OMEPRAZOLE 20 MG PO CPDR: 20.0000 mg | DELAYED_RELEASE_CAPSULE | Freq: Every day | ORAL | 0 refills | Status: DC

## 2021-03-12 MED ORDER — FAMOTIDINE 20 MG PO TABS: 20.0000 mg | ORAL_TABLET | Freq: Two times a day (BID) | ORAL | 0 refills | Status: DC

## 2021-03-12 NOTE — ED Provider Notes (Signed)
[facility] EMERGENCY DEPARTMENT Provider Note   CSN: [id number] Arrival date & time: 03/12/21  6578     History Chief Complaint  Patient presents with  . Chest Pain    EMS states it's CP vs Epigastric pain since 0630, described as stabbing, sharp pain, denies n/v and sweating, states has numbness and tingling in hands and fingers, he/she was out until 0400 drinking and smoking marijuana, transgender, 6 months on estrogen    Rhonda Ayers is a 40 y.o. adult.  Patient with history of HIV (controlled), transgender (female to female) -- presents to the ED for evaluation of chest pain.  Patient reports having some episodes of sharp lower chest pains over the past couple of days that is improved with food.  Her symptoms will improve for about an hour but then the pain comes back and she states she feels hungry again, despite eating.  Last night she was at a going away party for a family member and was drinking alcohol and smoking marijuana.  Around 630 she developed sharp pain in the epigastrium that radiates to the left chest.  Some associated shortness of breath.  No diaphoresis or vomiting.  No back pain, jaw pain or arm pain.  EMS was called for transport.  No treatments prior to arrival.  Patient denies history of hypertension, high cholesterol, diabetes.  She does use tobacco products.  Denies family history of coronary artery disease.  She is on chronic estrogen therapy.  Patient denies other risk factors for pulmonary embolism including: unilateral leg swelling, history of DVT/PE/other blood clots, recent immobilizations, recent surgery, recent travel (>4hr segment), malignancy, hemoptysis.          Past Medical History:  Diagnosis Date  . Asthma   . Carpal tunnel syndrome 07/03/2016  . Depression 07/03/2016  . Gonorrhea 08/31/2020  . Hand laceration 09/01/2018  . Health care maintenance 01/28/2017  . HIV infection (HCC)   . Homelessness 09/01/2018  . HPV (human  papilloma virus) anogenital infection 08/31/2020  . 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 Left leg pain 07/03/2016  . Rectal pain 12/15/2018  . 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 Work related injury 09/01/2018    Patient Active Problem List   Diagnosis Date Noted  . Sore throat 12/23/2020  . Gonorrhea 08/31/2020  . 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 Hand laceration 09/01/2018  . Homelessness 09/01/2018  . Health care maintenance 01/28/2017  . Carpal tunnel syndrome 07/03/2016  . Depression 07/03/2016  . 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 HIV disease (HCC) 01/31/2015  . Female-to-female transgender person 01/31/2015    Past Surgical History:  Procedure Laterality Date  . FOOT SURGERY    . NASAL SEPTUM SURGERY         Family History  Problem Relation Age of Onset  . Diabetes Mother     Social History   Tobacco Use  . Smoking status: Light Tobacco Smoker    Packs/day: 0.10    Types: Cigarettes  . Smokeless tobacco: Never Used  . Tobacco comment: "2 cigarettes a day"   Vaping Use  . Vaping Use: Never used  Substance Use Topics  . Alcohol use: Not Currently    Alcohol/week: 0.0 standard drinks    Comment: occasional  . Drug use: Yes    Types: Marijuana    Home Medications Prior to Admission medications   Medication Sig Start Date End Date Taking? Authorizing Provider  amoxicillin-clavulanate (AUGMENTIN) 875-125 MG tablet Take 1 tablet by mouth  every 12 (twelve) hours. 01/21/21   Rhys Martini, PA-C  bictegravir-emtricitabine-tenofovir AF (BIKTARVY) 50-200-25 MG TABS tablet Take 1 tablet by mouth daily. 08/31/20   Randall Hiss, MD  estradiol cypionate (DEPO-ESTRADIOL) 5 MG/ML injection Inject 0.5 mLs (2.5 mg total) into the muscle every 28 (twenty-eight) days. 08/31/20   Randall Hiss, MD  ibuprofen (ADVIL) 400 MG tablet Take 1 tablet (400 mg total) by mouth 3 (three) times daily. 01/21/21   Rhys Martini, PA-C  lidocaine (XYLOCAINE) 2 % solution Use  as directed 15 mLs in the mouth or throat as needed for mouth pain. 01/21/21   Rhys Martini, PA-C  spironolactone (ALDACTONE) 100 MG tablet Take 1.5 tablets (150 mg total) by mouth daily. 08/31/20   Randall Hiss, MD  tiZANidine (ZANAFLEX) 4 MG tablet Take 1 tablet (4 mg total) by mouth every 8 (eight) hours as needed. 12/07/20   Wallis Bamberg, PA-C    Allergies    Tramadol and Tylenol [acetaminophen]  Review of Systems   Review of Systems  Constitutional: Negative for fever.  HENT: Negative for rhinorrhea and sore throat.   Eyes: Negative for redness.  Respiratory: Negative for cough.   Cardiovascular: Positive for chest pain.  Gastrointestinal: Positive for abdominal pain. Negative for diarrhea, nausea and vomiting.  Genitourinary: Negative for dysuria, frequency, hematuria and urgency.  Musculoskeletal: Negative for myalgias.  Skin: Negative for rash.  Neurological: Negative for headaches.  Psychiatric/Behavioral: The patient is nervous/anxious.     Physical Exam Updated Vital Signs BP (!) 141/82 (BP Location: Right Arm)   Pulse 94   Temp 98.1 F (36.7 C) (Oral)   Resp 18   Ht 5\' 7"  (1.702 m)   Wt 98.4 kg   SpO2 100%   BMI 33.99 kg/m   Physical Exam Vitals and nursing note reviewed.  Constitutional:      Appearance: She is well-developed. She is not diaphoretic.  HENT:     Head: Normocephalic and atraumatic.     Mouth/Throat:     Mouth: Mucous membranes are not dry.  Eyes:     Conjunctiva/sclera: Conjunctivae normal.  Neck:     Vascular: Normal carotid pulses. No carotid bruit or JVD.     Trachea: Trachea normal. No tracheal deviation.  Cardiovascular:     Rate and Rhythm: Normal rate and regular rhythm.     Pulses: No decreased pulses.     Heart sounds: Normal heart sounds, S1 normal and S2 normal. Heart sounds not distant. No murmur heard.   Pulmonary:     Effort: Pulmonary effort is normal. No respiratory distress.     Breath sounds: Normal breath  sounds. No wheezing.  Chest:     Chest wall: No tenderness.  Abdominal:     General: Bowel sounds are normal.     Palpations: Abdomen is soft.     Tenderness: There is abdominal tenderness. There is no guarding or rebound.     Comments: Epigastric tenderness, mild to moderate  Musculoskeletal:     Cervical back: Normal range of motion and neck supple. No muscular tenderness.  Skin:    General: Skin is warm and dry.     Coloration: Skin is not pale.  Neurological:     Mental Status: She is alert.  Psychiatric:        Mood and Affect: Mood is anxious.     ED Results / Procedures / Treatments   Labs (all labs ordered are listed, but  only abnormal results are displayed) Labs Reviewed - No data to display  ED ECG REPORT   Date: 03/12/2021  Rate: 94  Rhythm: normal sinus rhythm  QRS Axis: right  Intervals: normal  ST/T Wave abnormalities: nonspecific ST changes  Conduction Disutrbances:none  Narrative Interpretation:   Old EKG Reviewed: unchanged from 09/01/17  I have personally reviewed the EKG tracing and disagree with the computerized printout as noted.  Radiology No results found.  Procedures Procedures   Medications Ordered in ED Medications  ondansetron (ZOFRAN) injection 4 mg (4 mg Intravenous Given 03/12/21 0837)  alum & mag hydroxide-simeth (MAALOX/MYLANTA) 200-200-20 MG/5ML suspension 30 mL (30 mLs Oral Given 03/12/21 0836)    And  lidocaine (XYLOCAINE) 2 % viscous mouth solution 15 mL (15 mLs Oral Given 03/12/21 0836)  pantoprazole (PROTONIX) injection 40 mg (40 mg Intravenous Given 03/12/21 0836)  sodium chloride 0.9 % bolus 1,000 mL (0 mLs Intravenous Stopped 03/12/21 1021)  potassium chloride SA (KLOR-CON) CR tablet 60 mEq (60 mEq Oral Given 03/12/21 0933)    ED Course  I have reviewed the triage vital signs and the nursing notes.  Pertinent labs & imaging results that were available during my care of the patient were reviewed by me and considered in my  medical decision making (see chart for details).  Patient seen and examined. Work-up initiated. Medications ordered.   Vital signs reviewed and are as follows: BP (!) 141/82 (BP Location: Right Arm)   Pulse 94   Temp 98.1 F (36.7 C) (Oral)   Resp 18   Ht 5\' 7"  (1.702 m)   Wt 98.4 kg   SpO2 100%   BMI 33.99 kg/m   On initial exam, this seems very GI in nature.  She reports recent pain, improved with eating.  Worsened this morning after drinking and smoking.  Will rule out ACS with typical work-up and labs.  Will check hepatic function panel and lipase.  Patient does have some risk factors for DVT/PE, especially with use of exogenous hormones.  No clinical signs of DVT on exam and overall symptoms are atypical for PE.  No tachycardia or hypoxia.  Feel this is less likely at this time.  12:49 PM second troponin level returned negative.  Patient updated.  She appears comfortable.  We discussed results.  Encourage PCP follow-up in the next week.  Will start on omeprazole, Pepcid, Carafate.  Encouraged avoidance of alcohol and NSAIDs.  The patient was urged to return to the Emergency Department immediately with worsening of current symptoms, worsening abdominal pain, persistent vomiting, blood noted in stools, fever, or any other concerns. The patient verbalized understanding.   Patient was counseled to return with severe chest pain, especially if the pain is crushing or pressure-like and spreads to the arms, back, neck, or jaw, or if they have sweating, nausea, or shortness of breath with the pain. They were encouraged to call 911 with these symptoms.   The patient verbalized understanding and agreed.      MDM Rules/Calculators/A&P                          Patient presents with epigastric pain and lower chest pain after drinking alcohol and smoking marijuana.  No concerning risk factors for ACS, however is HIV positive.  Cardiac work-up with reassuring EKG and troponin x2.  UDS positive  for cocaine.  Pain seems most likely GI in nature given temporary improvement with eating.  Patient is on  estrogen, which makes PE a possibility, but her symptoms are really atypical for PE at this time.  No tachycardia or hypoxia.  No clinical signs of DVT.  I feel she is very low risk for DVT/PE.  Work-up reassuring.  Will start GI meds as above.  Encourage PCP follow-up.  Strict return instructions as well.   Final Clinical Impression(s) / ED Diagnoses Final diagnoses:  Epigastric pain  Hypokalemia    Rx / DC Orders ED Discharge Orders         Ordered    omeprazole (PRILOSEC) 20 MG capsule  Daily        03/12/21 1247    famotidine (PEPCID) 20 MG tablet  2 times daily        03/12/21 1247    sucralfate (CARAFATE) 1 GM/10ML suspension  3 times daily with meals & bedtime        03/12/21 1247           Renne Crigler, PA-C 03/12/21 1252    Horton, Clabe Seal, DO 03/12/21 1337

## 2021-03-12 NOTE — ED Notes (Signed)
Pt transported to xray 

## 2021-03-12 NOTE — Discharge Instructions (Signed)
Please read and follow all provided instructions.  Your diagnoses today include:  1. Epigastric pain     Tests performed today include:  Blood cell counts and platelets  Kidney and liver function tests  Pancreas function test (called lipase)  Urine test to look for infection  EKG - no signs of stress on the heart  Cardiac enzymes - no signs of stress on the heart  Vital signs. See below for your results today.   Medications prescribed:   Omeprazole (Prilosec) - stomach acid reducer  This medication can be found over-the-counter. Please take at least 4 hours after your HIV medications.    Pepcid (famotidine) - antihistamine  You can find this medication over-the-counter.   DO NOT exceed:   20mg  Pepcid every 12 hours   Carafate - for stomach upset and to protect your stomach  Take any prescribed medications only as directed.  Home care instructions:   Follow any educational materials contained in this packet.  Eat only clear liquids for the next day and then slowly advance your diet as your are feeling better.  Follow-up instructions: Please follow-up with your primary care provider in the next 7 days for further evaluation of your symptoms.    Return instructions:  SEEK IMMEDIATE MEDICAL ATTENTION IF:  The pain does not go away or becomes severe   A temperature above 101F develops   Repeated vomiting occurs (multiple episodes)   The pain becomes localized to portions of the abdomen. The right side could possibly be appendicitis. In an adult, the left lower portion of the abdomen could be colitis or diverticulitis.   Blood is being passed in stools or vomit (bright red or black tarry stools)   You develop chest pain, difficulty breathing, dizziness or fainting, or become confused, poorly responsive, or inconsolable (young children)  If you have any other emergent concerns regarding your health  Additional Information: Abdominal (belly) pain can be  caused by many things. Your caregiver performed an examination and possibly ordered blood/urine tests and imaging (CT scan, x-rays, ultrasound). Many cases can be observed and treated at home after initial evaluation in the emergency department. Even though you are being discharged home, abdominal pain can be unpredictable. Therefore, you need a repeated exam if your pain does not resolve, returns, or worsens. Most patients with abdominal pain don't have to be admitted to the hospital or have surgery, but serious problems like appendicitis and gallbladder attacks can start out as nonspecific pain. Many abdominal conditions cannot be diagnosed in one visit, so follow-up evaluations are very important.  Your vital signs today were: BP (!) 98/55   Pulse 70   Temp 98.1 F (36.7 C) (Oral)   Resp 15   Ht 5\' 7"  (1.702 m)   Wt 98.4 kg   SpO2 96%   BMI 33.99 kg/m  If your blood pressure (bp) was elevated above 135/85 this visit, please have this repeated by your doctor within one month. --------------

## 2021-03-12 NOTE — ED Triage Notes (Signed)
-   Refer to chief complaint.

## 2021-03-12 NOTE — ED Notes (Signed)
MSE E sig not obtainable due to electronic pad failure, secretary in [place] notified

## 2021-03-12 NOTE — ED Notes (Signed)
Pt states he is feeling better, mom at bedside, pt states, "I think it was that weed I smoked"

## 2021-03-12 NOTE — ED Notes (Signed)
I called lab to request pt's troponin results.  Lab to call me back

## 2021-03-12 NOTE — ED Notes (Addendum)
Lab called back and stated would have troponin results at 1157, PA Josh notified

## 2021-03-17 ENCOUNTER — Other Ambulatory Visit: Payer: Self-pay

## 2021-03-17 ENCOUNTER — Encounter: Payer: Self-pay | Admitting: Infectious Diseases

## 2021-03-17 ENCOUNTER — Ambulatory Visit (INDEPENDENT_AMBULATORY_CARE_PROVIDER_SITE_OTHER): Payer: Self-pay | Admitting: Infectious Diseases

## 2021-03-17 VITALS — BP 121/87 | HR 80 | Temp 97.7°F | Wt 210.0 lb

## 2021-03-17 DIAGNOSIS — Z789 Other specified health status: Secondary | ICD-10-CM

## 2021-03-17 DIAGNOSIS — J029 Acute pharyngitis, unspecified: Secondary | ICD-10-CM

## 2021-03-17 DIAGNOSIS — B20 Human immunodeficiency virus [HIV] disease: Secondary | ICD-10-CM

## 2021-03-17 DIAGNOSIS — Z23 Encounter for immunization: Secondary | ICD-10-CM

## 2021-03-17 NOTE — Progress Notes (Signed)
Subjective:    Patient ID: Rhonda Ayers, adult    DOB: [birth year], 40 y.o.   MRN: [id number]  CC:  FU HIV care Hormone care ER visit recently    HPI:  Special is a 40 y.o. adult with well controlled HIV disease last VL < 20 and CD4 476 currently maintained on once daily Biktarvy.   Had an ER visit recently for chest pain - found out that some of the marijuana she was using was laced with cocaine. She has since stopped using. Smokes 1-2 black and mild cigars daily.   She is taking 2 mL of Estradiol (10 mg) every 1 week. She has been using her stomach and arms. Wondering what site works better. She has noticed some breast development and nipple tenderness which she is excited about. Has been on this regimen for a few months now. Is also taking spironolactone 200 mg daily (she has 100 mg tablets). No dizziness noted. Has noticed increased urination with this.    Allergies  Allergen Reactions  . Tramadol Other (See Comments)    "shakes"  . Tylenol [Acetaminophen]       Outpatient Medications Prior to Visit  Medication Sig Dispense Refill  . bictegravir-emtricitabine-tenofovir AF (BIKTARVY) 50-200-25 MG TABS tablet Take 1 tablet by mouth daily. 30 tablet 5  . estradiol cypionate (DEPO-ESTRADIOL) 5 MG/ML injection Inject 0.5 mLs (2.5 mg total) into the muscle every 28 (twenty-eight) days. (Patient taking differently: Inject 10 mg into the muscle once a week.) 10 mL 11  . spironolactone (ALDACTONE) 50 MG tablet Take 200 mg by mouth daily.    . famotidine (PEPCID) 20 MG tablet Take 1 tablet (20 mg total) by mouth 2 (two) times daily. 30 tablet 0  . omeprazole (PRILOSEC) 20 MG capsule Take 1 capsule (20 mg total) by mouth daily. 21 capsule 0  . sucralfate (CARAFATE) 1 GM/10ML suspension Take 10 mLs (1 g total) by mouth 4 (four) times daily -  with meals and at bedtime. 420 mL 0   No facility-administered medications prior to visit.     Past Medical History:  Diagnosis Date  .  Asthma   . Carpal tunnel syndrome 07/03/2016  . Depression 07/03/2016  . Gonorrhea 08/31/2020  . Hand laceration 09/01/2018  . Health care maintenance 01/28/2017  . HIV infection (HCC)   . Homelessness 09/01/2018  . 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 Left leg pain 07/03/2016  . Rectal pain 12/15/2018  . 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 Work related injury 09/01/2018      Past Surgical History:  Procedure Laterality Date  . FOOT SURGERY    . NASAL SEPTUM SURGERY        Review of Systems        Objective:    BP 121/87   Pulse 80   Temp 97.7 F (36.5 C) (Oral)   Wt 210 lb (95.3 kg)   SpO2 97%   BMI 32.89 kg/m  Nursing note and vital signs reviewed.   Physical Exam Vitals reviewed.  Constitutional:      Appearance: She is well-developed.     Comments: Seated comfortably in chair during visit.   HENT:     Mouth/Throat:     Dentition: Normal dentition. No dental abscesses.  Cardiovascular:     Rate and Rhythm: Normal rate and regular rhythm.     Heart sounds: Normal heart sounds.  Pulmonary:     Effort: Pulmonary effort is  normal.     Breath sounds: Normal breath sounds.  Chest:     Comments: Early growth of breast tissue. No nipple discharge.  Abdominal:     General: There is no distension.     Palpations: Abdomen is soft.     Tenderness: There is no abdominal tenderness.  Lymphadenopathy:     Cervical: No cervical adenopathy.  Skin:    General: Skin is warm and dry.     Findings: No rash.  Neurological:     Mental Status: She is alert and oriented to person, place, and time.  Psychiatric:        Judgment: Judgment normal.     Comments: In good spirits today and engaged in care discussion.          Assessment & Plan:   Patient Active Problem List   Diagnosis Date Noted  . 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 Hand laceration 09/01/2018  . Homelessness 09/01/2018  .  Health care maintenance 01/28/2017  . Depression 07/03/2016  . 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 HIV disease (HCC) 01/31/2015  . Female-to-female transgender person 01/31/2015    Problem List Items Addressed This Visit      Unprioritized   RESOLVED: Sore throat   Female-to-female transgender person    She is taking 10 mg estradiol valerate weekly via IM injection and 200 mg spironolactone daily. Seems to be tolerating well and with early breast development. Will check estradiol and testosterone levels today. CMP for therapeutic monitoring on spiro. Discussed reversible/permanent changes a/w hormone affirming therapy, timeline for expected changes and her goals.  Will have her back to review labs with Dr. Daiva Eves in 2 weeks. Will help to refer her to [facility] as she would like to consider surgery. She understands it may be a few months prior to this appointment being made.  Smoking cessation preferred.        HIV disease (HCC)    Doing well on biktarvy once daily. Will update pertinent labs today. Declined STI screening today.  Will give menveo today since only received 1 previously.  FU with Dr. Daiva Eves.       Relevant Orders   Comprehensive metabolic panel   HIV-1 RNA quant-no reflex-bld   T-helper cells (CD4) count   MENINGOCOCCAL MCV4O(MENVEO) (Completed)    Other Visit Diagnoses    Transgender    -  Primary   Relevant Orders   Estradiol   Testosterone   Ambulatory referral to Urology   Need for meningitis vaccination       Relevant Orders   MENINGOCOCCAL MCV4O(MENVEO) (Completed)     I spent 30 min with the patient in direct counsel and discussion of the above.    Rexene Alberts, MSN, NP-C [facility]  [email or web address] Pager: [phone] Office: [phone] [facility] Main Line: [phone]

## 2021-03-17 NOTE — Patient Instructions (Addendum)
Nice to see you! Please by the lab on your way out  Will put in a referral for you to go to the [facility] - may be a few months until they see you.   Will have you back to see Dr. Daiva Eves to review labs and talk about hormones on 03/30/2021.

## 2021-03-17 NOTE — Assessment & Plan Note (Signed)
Doing well on biktarvy once daily. Will update pertinent labs today. Declined STI screening today.  Will give menveo today since only received 1 previously.  FU with Dr. Daiva Eves.

## 2021-03-17 NOTE — Assessment & Plan Note (Addendum)
She is taking 10 mg estradiol valerate weekly via IM injection and 200 mg spironolactone daily. Seems to be tolerating well and with early breast development. Will check estradiol and testosterone levels today. CMP for therapeutic monitoring on spiro. Discussed reversible/permanent changes a/w hormone affirming therapy, timeline for expected changes and her goals.  Will have her back to review labs with Dr. Daiva Eves in 2 weeks. Will help to refer her to [facility] as she would like to consider surgery. She understands it may be a few months prior to this appointment being made.  Smoking cessation preferred.

## 2021-03-20 LAB — COMPREHENSIVE METABOLIC PANEL
AG Ratio: 1.4 (calc) (ref 1.0–2.5)
ALT: 14 U/L (ref 9–46)
AST: 17 U/L (ref 10–40)
Albumin: 4.2 g/dL (ref 3.6–5.1)
Alkaline phosphatase (APISO): 57 U/L (ref 36–130)
BUN: 12 mg/dL (ref 7–25)
CO2: 25 mmol/L (ref 20–32)
Calcium: 9.6 mg/dL (ref 8.6–10.3)
Chloride: 103 mmol/L (ref 98–110)
Creat: 0.9 mg/dL (ref 0.60–1.35)
Globulin: 2.9 g/dL (calc) (ref 1.9–3.7)
Glucose, Bld: 88 mg/dL (ref 65–99)
Potassium: 3.9 mmol/L (ref 3.5–5.3)
Sodium: 136 mmol/L (ref 135–146)
Total Bilirubin: 0.6 mg/dL (ref 0.2–1.2)
Total Protein: 7.1 g/dL (ref 6.1–8.1)

## 2021-03-20 LAB — ESTRADIOL: Estradiol: 315 pg/mL — ABNORMAL HIGH (ref <=39)

## 2021-03-20 LAB — HIV-1 RNA QUANT-NO REFLEX-BLD
HIV 1 RNA Quant: NOT DETECTED Copies/mL
HIV-1 RNA Quant, Log: NOT DETECTED Log cps/mL

## 2021-03-20 LAB — T-HELPER CELLS (CD4) COUNT (NOT AT ARMC)
Absolute CD4: 773 cells/uL (ref 490–1740)
CD4 T Helper %: 43 % (ref 30–61)
Total lymphocyte count: 1796 cells/uL (ref 850–3900)

## 2021-03-20 LAB — TESTOSTERONE: Testosterone: 78 ng/dL — ABNORMAL LOW (ref 250–827)

## 2021-03-30 ENCOUNTER — Ambulatory Visit: Payer: Medicaid Other | Admitting: Infectious Disease

## 2021-04-13 ENCOUNTER — Ambulatory Visit (INDEPENDENT_AMBULATORY_CARE_PROVIDER_SITE_OTHER): Payer: Medicaid Other

## 2021-04-13 ENCOUNTER — Ambulatory Visit (INDEPENDENT_AMBULATORY_CARE_PROVIDER_SITE_OTHER): Payer: Self-pay | Admitting: Infectious Diseases

## 2021-04-13 ENCOUNTER — Encounter: Payer: Self-pay | Admitting: Infectious Diseases

## 2021-04-13 ENCOUNTER — Other Ambulatory Visit: Payer: Self-pay

## 2021-04-13 VITALS — BP 123/81 | HR 136 | Temp 98.0°F | Ht 67.0 in | Wt 216.0 lb

## 2021-04-13 DIAGNOSIS — Z789 Other specified health status: Secondary | ICD-10-CM

## 2021-04-13 DIAGNOSIS — Z23 Encounter for immunization: Secondary | ICD-10-CM

## 2021-04-13 DIAGNOSIS — A630 Anogenital (venereal) warts: Secondary | ICD-10-CM

## 2021-04-13 DIAGNOSIS — Z113 Encounter for screening for infections with a predominantly sexual mode of transmission: Secondary | ICD-10-CM

## 2021-04-13 DIAGNOSIS — B20 Human immunodeficiency virus [HIV] disease: Secondary | ICD-10-CM

## 2021-04-13 DIAGNOSIS — Z79899 Other long term (current) drug therapy: Secondary | ICD-10-CM

## 2021-04-13 NOTE — Assessment & Plan Note (Signed)
Doing well Partner HIV+, undetectable.  Offered/refused condoms.  COVID booster today rtc in 9 months.

## 2021-04-13 NOTE — Assessment & Plan Note (Signed)
Will send to Endo while awaiting [facility] trans clinic eval.

## 2021-04-13 NOTE — Assessment & Plan Note (Signed)
Denies any current genital lesions.

## 2021-04-13 NOTE — Progress Notes (Signed)
   Subjective:    Patient ID: Rhonda Ayers, adult  DOB: 05/25/1981, 40 y.o.        MRN: [id number]   HPI 40 yo M --> F with hx of HIV+, currently taking spirolactone, estradiol, biktarvy. Was adm to ED after smoking marijuana that was laced with cocaine.  Has prev seen Endo but can't remember who. Has not had visit with [facility] trans clinic.   Otherwise feeling well.  Quit smoking, has been exercising.  Beast growth and tenderness.    HIV 1 RNA Quant (Copies/mL)  Date Value  03/17/2021 Not Detected  08/31/2020 <20  07/27/2020 642 (H)   CD4 T Cell Abs (/uL)  Date Value  07/27/2020 476  11/21/2018 460  08/04/2018 660     Health Maintenance  Topic Date Due  . Pneumococcal Vaccine 37-72 Years old (1 - PCV) Never done  . PAP SMEAR-Modifier  Never done  . COVID-19 Vaccine (2 - Pfizer risk series) 09/16/2020  . INFLUENZA VACCINE  05/15/2021  . TETANUS/TDAP  08/19/2028  . Hepatitis C Screening  Completed  . HIV Screening  Completed  . HPV VACCINES  Aged Out      Review of Systems  Constitutional:  Negative for chills and fever.  Respiratory:  Negative for cough and shortness of breath.   Gastrointestinal:  Negative for constipation and diarrhea.  Genitourinary:  Negative for dysuria.   Please see HPI. All other systems reviewed and negative.     Objective:  Physical Exam Vitals reviewed.  Constitutional:      General: She is not in acute distress.    Appearance: She is not ill-appearing, toxic-appearing or diaphoretic.  HENT:     Mouth/Throat:     Mouth: Mucous membranes are moist.     Pharynx: No oropharyngeal exudate.  Eyes:     Extraocular Movements: Extraocular movements intact.     Pupils: Pupils are equal, round, and reactive to light.  Cardiovascular:     Rate and Rhythm: Normal rate and regular rhythm.  Pulmonary:     Effort: Pulmonary effort is normal.     Breath sounds: Normal breath sounds.  Chest:     Comments: Breasts normal, no lesions,  lumps.  Seen with nurse.  Abdominal:     General: Bowel sounds are normal. There is no distension.     Palpations: Abdomen is soft.     Tenderness: There is no abdominal tenderness.  Musculoskeletal:        General: No swelling.     Cervical back: Normal range of motion.     Right lower leg: No edema.     Left lower leg: No edema.  Lymphadenopathy:     Cervical: No cervical adenopathy.  Neurological:     Mental Status: She is alert.          Assessment & Plan:

## 2021-05-01 ENCOUNTER — Telehealth: Payer: Self-pay

## 2021-05-01 NOTE — Telephone Encounter (Signed)
Received fax from [facility] stating Depo- Estradiol 5mg /19ml is currently unavailable. Wholesaler wont be available until 09/2021. Forwarding message to Dr. 10/2021 for next steps.  Daiva Eves, RMA

## 2021-05-03 NOTE — Telephone Encounter (Signed)
That looks like it is the Depo-Estradiol 5mg /47mL (cypionate version). There are other formulations - Delestrogen  10mg /ml, 20 mg/ml, 40 mg/ml (valerate version). We could probably switch to another IM formulation?

## 2021-05-05 ENCOUNTER — Other Ambulatory Visit: Payer: Self-pay | Admitting: Pharmacist

## 2021-05-05 DIAGNOSIS — Z789 Other specified health status: Secondary | ICD-10-CM

## 2021-05-05 MED ORDER — ESTRADIOL VALERATE 20 MG/ML IM OIL: 20.0000 mg | TOPICAL_OIL | INTRAMUSCULAR | 3 refills | Status: DC

## 2021-05-05 NOTE — Telephone Encounter (Signed)
Called patient with update. Does not have any questions at this time. Juanita Laster, RMA

## 2021-05-05 NOTE — Telephone Encounter (Signed)
Sure thing.  Range of dosing for Valerate IM injections is 5-30 mg every 2 weeks for transgender females. Sent in 20 mg every 2 weeks to [employer].

## 2021-05-31 ENCOUNTER — Ambulatory Visit: Payer: Medicaid Other | Admitting: Endocrinology

## 2021-06-16 ENCOUNTER — Telehealth: Payer: Self-pay

## 2021-06-16 NOTE — Telephone Encounter (Signed)
Patient called complaining of diarrhea for the last 2-3 days. She states she has 3 or 4 episodes a day. Denies vomiting or fever, requests an appointment with Dr. Ninetta Lights.   She accepts earliest available appointment for 06/20/21. Recommended that if her symptoms worsen over the weekend she should seek care at urgent care or emergency room. Patient verbalized understanding and has no further questions.   Sandie Ano, RN

## 2021-06-20 ENCOUNTER — Ambulatory Visit: Payer: Medicaid Other | Admitting: Infectious Diseases

## 2021-06-22 ENCOUNTER — Ambulatory Visit (HOSPITAL_COMMUNITY)
Admission: EM | Admit: 2021-06-22 | Discharge: 2021-06-22 | Disposition: A | Discharge status: Home / Self Care | Payer: Self-pay | Attending: Physician Assistant | Admitting: Physician Assistant

## 2021-06-22 ENCOUNTER — Other Ambulatory Visit: Payer: Self-pay

## 2021-06-22 ENCOUNTER — Encounter (HOSPITAL_COMMUNITY): Payer: Self-pay | Admitting: Emergency Medicine

## 2021-06-22 DIAGNOSIS — S51812A Laceration without foreign body of left forearm, initial encounter: Secondary | ICD-10-CM

## 2021-06-22 MED ORDER — IBUPROFEN 800 MG PO TABS
ORAL_TABLET | ORAL | Status: AC
  Filled 2021-06-22: qty 1

## 2021-06-22 MED ORDER — IBUPROFEN 800 MG PO TABS
800.0000 mg | ORAL_TABLET | Freq: Once | ORAL | Status: AC
  Administered 2021-06-22: 800 mg via ORAL

## 2021-06-22 MED ORDER — LIDOCAINE HCL (PF) 2 % IJ SOLN
INTRAMUSCULAR | Status: AC
  Filled 2021-06-22: qty 5

## 2021-06-22 NOTE — ED Triage Notes (Signed)
Pt reports cut left forearm earlier on piece broke glass. Unsure when last tetanus shot was.

## 2021-06-22 NOTE — ED Provider Notes (Signed)
[facility]    CSN: [id number] Arrival date & time: 06/22/21  1732      History   Chief Complaint Chief Complaint  Patient presents with   Laceration    HPI Rhonda Ayers is a 40 y.o. adult.   The history is provided by the patient. No language interpreter was used.  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 Authorizing Provider  bictegravir-emtricitabine-tenofovir AF (BIKTARVY) 50-200-25 MG TABS tablet Take 1 tablet by mouth daily. 08/31/20   Randall Hiss, MD  estradiol valerate (DELESTROGEN) 20 MG/ML injection Inject 1 mL (20 mg total) into the muscle every 14 (fourteen) days. 05/05/21   Kuppelweiser, Cassie L, RPH-CPP   spironolactone (ALDACTONE) 50 MG tablet Take 200 mg by mouth daily. 02/13/21   [provider]    Family History Family History  Problem Relation Age of Onset   Diabetes Mother     Social History Social History   Tobacco Use   Smoking status: Light Smoker    Packs/day: 0.10    Types: Cigarettes   Smokeless tobacco: Never   Tobacco comments:    "2 cigarettes a day"   Vaping Use   Vaping Use: Never used  Substance Use Topics   Alcohol use: Not Currently    Alcohol/week: 0.0 standard drinks    Comment: occasional   Drug use: Yes    Types: Marijuana    Comment: has stopped marijuana afer smoking some that was laced     Allergies   Tramadol and Tylenol [acetaminophen]   Review of Systems Review of Systems  Constitutional:  Negative for fever.  All other systems reviewed and are negative.   Physical Exam Triage Vital Signs ED Triage Vitals  Enc Vitals Group     BP 06/22/21 1752 102/65     Pulse Rate 06/22/21 1752 88     Resp 06/22/21 1752 16     Temp 06/22/21 1752 98 F (36.7 C)     Temp Source 06/22/21 1752 Oral     SpO2 06/22/21 1752 99 %     Weight --      Height --  Head Circumference --      Peak Flow --      Pain Score 06/22/21 1855 0     Pain Loc --      Pain Edu? --      Excl. in GC? --    No data found.  Updated Vital Signs BP 102/65 (BP Location: Right Arm)   Pulse 88   Temp 98 F (36.7 C) (Oral)   Resp 16   SpO2 99%   Visual Acuity Right Eye Distance:   Left Eye Distance:   Bilateral Distance:    Right Eye Near:   Left Eye Near:    Bilateral Near:     Physical Exam Vitals reviewed.  Constitutional:      Appearance: Normal appearance.  Skin:    Comments: 1.2 cm  laceration left forearm   Neurological:     General: No focal deficit present.     Mental Status: She is alert.  Psychiatric:        Mood and Affect: Mood normal.     UC Treatments / Results  Labs (all labs ordered are listed, but only abnormal  results are displayed) Labs Reviewed - No data to display  EKG   Radiology No results found.  Procedures Laceration Repair  Date/Time: 06/22/2021 7:03 PM Performed by: Elson Areas, PA-C Authorized by: Elson Areas, PA-C   Consent:    Consent obtained:  Verbal   Consent given by:  Patient   Risks discussed:  Infection   Alternatives discussed:  No treatment Anesthesia:    Anesthesia method:  Local infiltration   Local anesthetic:  Lidocaine 2% w/o epi Laceration details:    Location:  Shoulder/arm   Shoulder/arm location:  L lower arm   Length (cm):  1.2 Exploration:    Imaging outcome: foreign body not noted   Treatment:    Area cleansed with:  Povidone-iodine   Amount of cleaning:  Standard   Debridement:  None Skin repair:    Repair method:  Sutures   Suture size:  5-0   Suture material:  Nylon   Suture technique:  Simple interrupted   Number of sutures:  3 Approximation:    Approximation:  Loose Repair type:    Repair type:  Simple (including critical care time)  Medications Ordered in UC Medications  ibuprofen (ADVIL) tablet 800 mg (800 mg Oral Given 06/22/21 1851)    Initial Impression / Assessment and Plan / UC Course  I have reviewed the triage vital signs and the nursing notes.  Pertinent labs & imaging results that were available during my care of the patient were reviewed by me and considered in my medical decision making (see chart for details).    I attempted dermabond but wound opened, dermabond removed    Pt advised suture removal in 8-10 days  Final Clinical Impressions(s) / UC Diagnoses   Final diagnoses:  Laceration of left forearm, initial encounter     Discharge Instructions      Return for suture removal in 8-10 days    ED Prescriptions   None    An After Visit Summary was printed and given to the patient.  PDMP not reviewed this encounter.   Elson Areas, [place] 06/22/21 1905

## 2021-06-22 NOTE — Discharge Instructions (Signed)
Return for suture removal in 8-10 days

## 2021-07-06 ENCOUNTER — Ambulatory Visit (HOSPITAL_COMMUNITY)
Admission: EM | Admit: 2021-07-06 | Discharge: 2021-07-06 | Disposition: A | Discharge status: Home / Self Care | Payer: Medicaid Other

## 2021-07-06 ENCOUNTER — Encounter (HOSPITAL_COMMUNITY): Payer: Self-pay

## 2021-07-06 ENCOUNTER — Emergency Department (HOSPITAL_COMMUNITY)
Admission: EM | Admit: 2021-07-06 | Discharge: 2021-07-06 | Disposition: A | Discharge status: Home / Self Care | Payer: Self-pay | Attending: Emergency Medicine | Admitting: Emergency Medicine

## 2021-07-06 ENCOUNTER — Other Ambulatory Visit: Payer: Self-pay

## 2021-07-06 DIAGNOSIS — X58XXXD Exposure to other specified factors, subsequent encounter: Secondary | ICD-10-CM | POA: Insufficient documentation

## 2021-07-06 DIAGNOSIS — F17210 Nicotine dependence, cigarettes, uncomplicated: Secondary | ICD-10-CM | POA: Insufficient documentation

## 2021-07-06 DIAGNOSIS — S51812D Laceration without foreign body of left forearm, subsequent encounter: Secondary | ICD-10-CM | POA: Insufficient documentation

## 2021-07-06 DIAGNOSIS — J45909 Unspecified asthma, uncomplicated: Secondary | ICD-10-CM | POA: Insufficient documentation

## 2021-07-06 DIAGNOSIS — Z4802 Encounter for removal of sutures: Secondary | ICD-10-CM | POA: Insufficient documentation

## 2021-07-06 NOTE — Discharge Instructions (Addendum)
You had your stitches removed today.  There was no signs of any infection.  Your physical exam was reassuring.  Please follow-up with your primary care provider as needed.  Get help right away if: You have a fever. You have redness that is spreading from your wound.

## 2021-07-06 NOTE — ED Triage Notes (Signed)
Pt reports having sutures placed on 9/8. She endorses numbness and swelling to left forearm.

## 2021-07-06 NOTE — ED Provider Notes (Signed)
[facility]Y [facility]PT Provider Note   CSN: [id number] Arrival date & time: 07/06/21  1630     History Chief Complaint  Patient presents with   Suture / Staple Removal   Numbness    Rhonda Ayers is a 40 y.o. adult with medical history as noted below.  Patient is female however identifies as female using the name "special," and using pronouns " she and her."  Presents to the emergency department with a chief complaint of suture removal.  Patient had laceration to left forearm 9/8.  Patient had 3 stitches placed.  Per chart review patient was told to have stitches removed in 8 to 10 days.  Patient denies any fever, chills, swelling, erythema, rash, purulent discharge, myalgia.  Patient endorses occasional numbness near laceration site.   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   Work related injury 09/01/2018    Patient Active Problem List   Diagnosis Date Noted   HPV (human papilloma virus) anogenital infection 08/31/2020   Hand laceration 09/01/2018   Homelessness 09/01/2018   Health care maintenance 01/28/2017   Depression 07/03/2016   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   HIV disease (HCC) 01/31/2015   Female-to-female transgender person 01/31/2015    Past Surgical History:  Procedure Laterality Date   FOOT SURGERY     NASAL SEPTUM SURGERY         Family History  Problem Relation Age of Onset   Diabetes Mother     Social History   Tobacco Use   Smoking status: Light Smoker    Packs/day: 0.10    Types: Cigarettes   Smokeless tobacco: Never    Tobacco comments:    "2 cigarettes a day"   Vaping Use   Vaping Use: Never used  Substance Use Topics   Alcohol use: Not Currently    Alcohol/week: 0.0 standard drinks    Comment: occasional   Drug use: Yes    Types: Marijuana    Comment: has stopped marijuana afer smoking some that was laced    Home Medications Prior to Admission medications   Medication Sig Start Date End Date Taking? Authorizing Provider  bictegravir-emtricitabine-tenofovir AF (BIKTARVY) 50-200-25 MG TABS tablet Take 1 tablet by mouth daily. 08/31/20   Randall Hiss, MD  estradiol valerate (DELESTROGEN) 20 MG/ML injection Inject 1 mL (20 mg total) into the muscle every 14 (fourteen) days. 05/05/21   Kuppelweiser, Cassie L, RPH-CPP  spironolactone (ALDACTONE) 50 MG tablet Take 200 mg by mouth daily. 02/13/21   [provider]    Allergies    Tramadol and Tylenol [acetaminophen]  Review of Systems   Review of Systems  Constitutional:  Negative for chills and fever.  Musculoskeletal:  Negative for myalgias.  Skin:  Positive for wound. Negative for color change, pallor and rash.  Neurological:  Positive for numbness. Negative for weakness.   Physical Exam Updated Vital Signs BP 135/72 (BP Location: Right Arm)   Pulse 76   Temp 98.1 F (36.7 C) (Oral)   Resp 18   SpO2 94%  Physical Exam Vitals and nursing note reviewed.  Constitutional:      General: She is not in acute distress.    Appearance: She is not ill-appearing, toxic-appearing or diaphoretic.  HENT:     Head: Normocephalic.  Eyes:     General: No scleral icterus.       Right eye: No discharge.        Left eye: No discharge.  Cardiovascular:     Rate and Rhythm: Normal rate.  Pulmonary:     Effort: Pulmonary effort is normal.  Musculoskeletal:     Left elbow: Normal.     Left forearm: Normal.     Left wrist: Normal.     Left hand: No swelling, deformity, lacerations, tenderness or bony tenderness. Normal range of  motion. Normal sensation. Normal pulse.     Comments: Well-healed wound to left forearm, wound is intact.  No swelling, erythema, or purulent discharge.  Patient has sensation intact throughout left upper extremity.  Skin:    General: Skin is warm and dry.  Neurological:     General: No focal deficit present.     Mental Status: She is alert.  Psychiatric:        Behavior: Behavior is cooperative.    ED Results / Procedures / Treatments   Labs (all labs ordered are listed, but only abnormal results are displayed) Labs Reviewed - No data to display  EKG None  Radiology No results found.  Procedures .Suture Removal  Date/Time: 07/06/2021 5:17 PM Performed by: Haskel Schroeder, PA-C Authorized by: Haskel Schroeder, PA-C   Consent:    Consent obtained:  Verbal   Risks discussed:  Bleeding, pain and wound separation   Alternatives discussed:  No treatment and delayed treatment Universal protocol:    Procedure explained and questions answered to patient or proxy's satisfaction: yes     Patient identity confirmed:  Verbally with patient Location:    Location:  Upper extremity   Upper extremity location:  Arm   Arm location:  L lower arm Procedure details:    Wound appearance:  No signs of infection, good wound healing and clean   Number of sutures removed:  2 Post-procedure details:    Procedure completion:  Tolerated well, no immediate complications   Medications Ordered in ED Medications - No data to display  ED Course  I have reviewed the triage vital signs and the nursing notes.  Pertinent labs & imaging results that were available during my care of the patient were reviewed by me and considered in my medical decision making (see chart for details).    MDM Rules/Calculators/A&P                           Alert 40 year old presents with a chief complaint of suture removal.  Wound clean, dry, and intact.  No signs of infection.  Per chart review patient had  3 stitches placed 9/8.  Patient reports that 1 stitch fell out over the last 2 weeks.  2 stitches were successfully removed without complication.  Patient has sensation intact to light touch throughout entire left upper extremity.  Discussed results, findings, treatment and follow up. Patient advised of return precautions. Patient verbalized understanding and agreed with plan.   Final Clinical Impression(s) / ED Diagnoses Final diagnoses:  None    Rx / DC Orders ED Discharge Orders     None        Haskel Schroeder,  PA-C 07/06/21 1719    Koleen Distance, MD 07/06/21 2245

## 2021-07-07 ENCOUNTER — Encounter: Payer: Self-pay | Admitting: Infectious Diseases

## 2021-07-17 ENCOUNTER — Ambulatory Visit: Payer: Medicaid Other | Admitting: Endocrinology

## 2021-07-27 ENCOUNTER — Telehealth: Payer: Self-pay

## 2021-07-27 NOTE — Telephone Encounter (Signed)
Patient called requesting an appointment; reports she's broken out into a rash and feels like her heart is racing. Patient requested a "full work up, labs for diabetes and everything. " RN advised that our office does not do primary care but she can be evaluated by a provider to see if there are issues with her hormones (patient states she feels it's related) and we can provide her information for a PCP at the visit.   Scheduled with Dr. Thedore Mins on Monday 10/17 Rhonda Randy, RN

## 2021-07-31 ENCOUNTER — Ambulatory Visit: Payer: Self-pay | Admitting: Internal Medicine

## 2021-07-31 NOTE — Progress Notes (Deleted)
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   ESTRADIOL VALERATE (DELESTROGEN) 20 MG/ML INJECTION    Inject 1 mL (20 mg total) into the muscle every 14 (fourteen) days.   SPIRONOLACTONE (ALDACTONE) 50 MG TABLET    Take 200 mg by mouth daily.  Modified Medications   No medications on file  Discontinued Medications   No medications on file    Subjective: 40 YO M->F with HIV on Biktarvy(CD4 1796, Vl <20 on 04/13/21) , on estradiol and spironolactone presents for rash all over.  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   Work related injury 09/01/2018    Social History   Tobacco Use   Smoking status: Light Smoker    Packs/day: 0.10    Types: Cigarettes   Smokeless tobacco: Never   Tobacco comments:    "2 cigarettes a day"   Vaping Use   Vaping Use: Never used  Substance Use Topics   Alcohol use: Not Currently    Alcohol/week: 0.0 standard drinks    Comment: occasional   Drug use: Yes    Types: Marijuana    Comment: has stopped marijuana afer smoking some that was laced     Family History  Problem Relation Age of Onset   Diabetes Mother     Allergies  Allergen Reactions   Tramadol Other (See Comments)    "shakes"   Tylenol [Acetaminophen]     Health Maintenance  Topic Date Due   PAP SMEAR-Modifier  Never done   COVID-19 Vaccine (3 - Pfizer risk series) 05/11/2021   INFLUENZA VACCINE  05/15/2021   TETANUS/TDAP  08/19/2028   Hepatitis C Screening  Completed   HIV Screening  Completed   HPV VACCINES  Aged Out    Objective:  There were no vitals filed for this visit. There is no height or weight on file to calculate BMI.  Physical Exam  Lab Results Lab Results  Component Value Date   WBC 4.4 03/12/2021   HGB 15.6 03/12/2021   HCT 45.8 03/12/2021   MCV 89.8 03/12/2021   PLT 213 03/12/2021    Lab Results  Component Value Date   CREATININE 0.90 03/17/2021   BUN 12 03/17/2021   NA 136 03/17/2021   K 3.9 03/17/2021   CL 103 03/17/2021   CO2 25 03/17/2021    Lab Results  Component Value Date   ALT 14 03/17/2021   AST 17 03/17/2021   ALKPHOS 54 03/12/2021   BILITOT 0.6 03/17/2021    Lab Results  Component Value Date   CHOL  189 08/04/2018   HDL 37 (L) 08/04/2018   LDLCALC 126 (H) 08/04/2018   TRIG 143 08/04/2018   CHOLHDL 5.1 (H) 08/04/2018   Lab Results  Component Value Date   LABRPR REACTIVE (A) 10/27/2020   RPRTITER 1:2 (H) 10/27/2020   HIV 1 RNA Quant (Copies/mL)  Date Value  03/17/2021 Not Detected  08/31/2020 <20  07/27/2020 642 (H)   CD4 T Cell Abs (/uL)  Date Value  07/27/2020 476  11/21/2018 460  08/04/2018 660     Problem List Items Addressed This Visit   None     Danelle Earthly, MD [facility] 07/31/2021, 6:11 AM

## 2021-08-04 ENCOUNTER — Ambulatory Visit: Payer: Medicaid Other | Admitting: Endocrinology

## 2021-08-04 IMAGING — CR DG CHEST 2V
2 series · 2 of 2 positions shown · non-contrast
Comparison: Chest x-ray 12/09/2018.

CLINICAL DATA: 40-year-old male with history of chest pain.

EXAM:
CHEST - 2 VIEW

[chest lat]
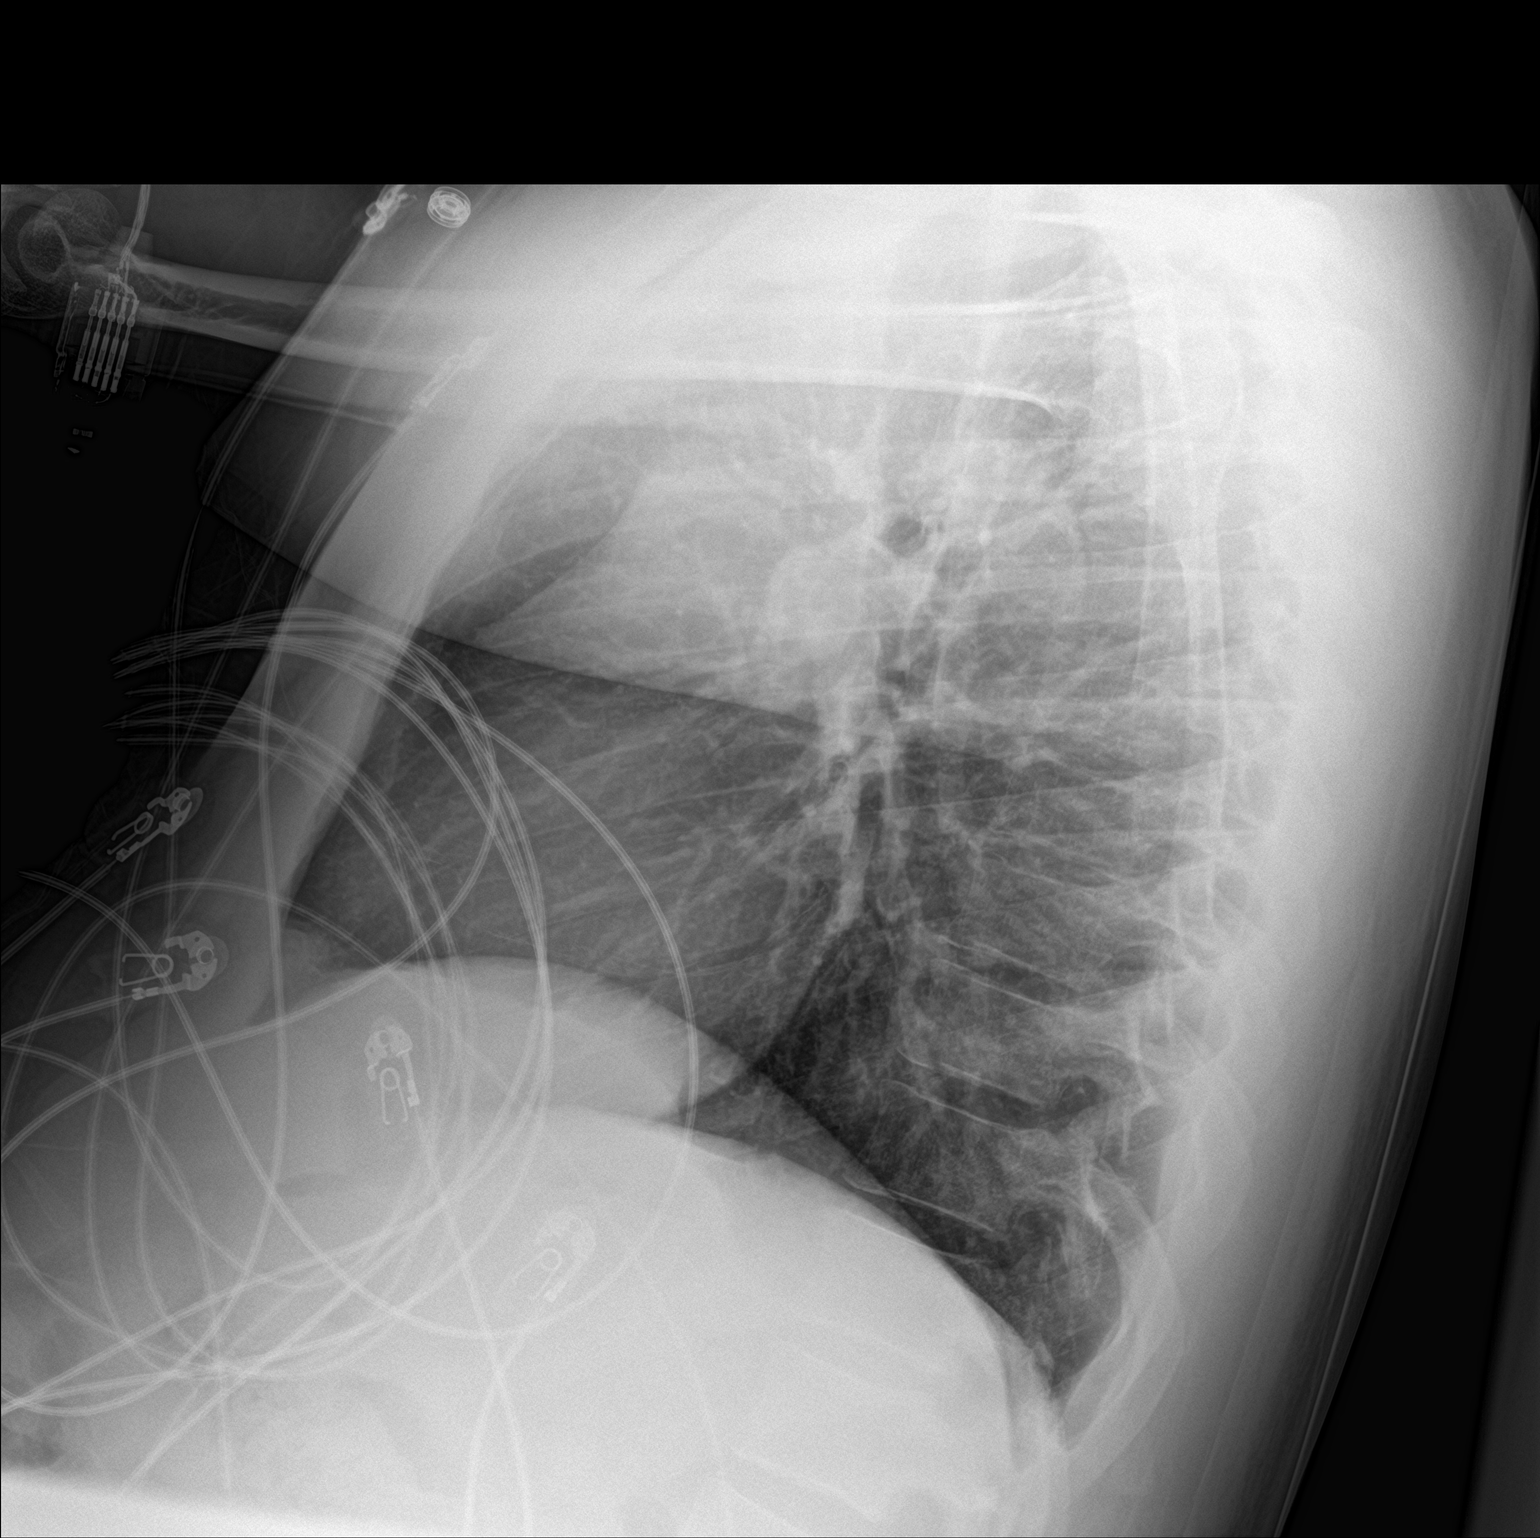

[chest ap]
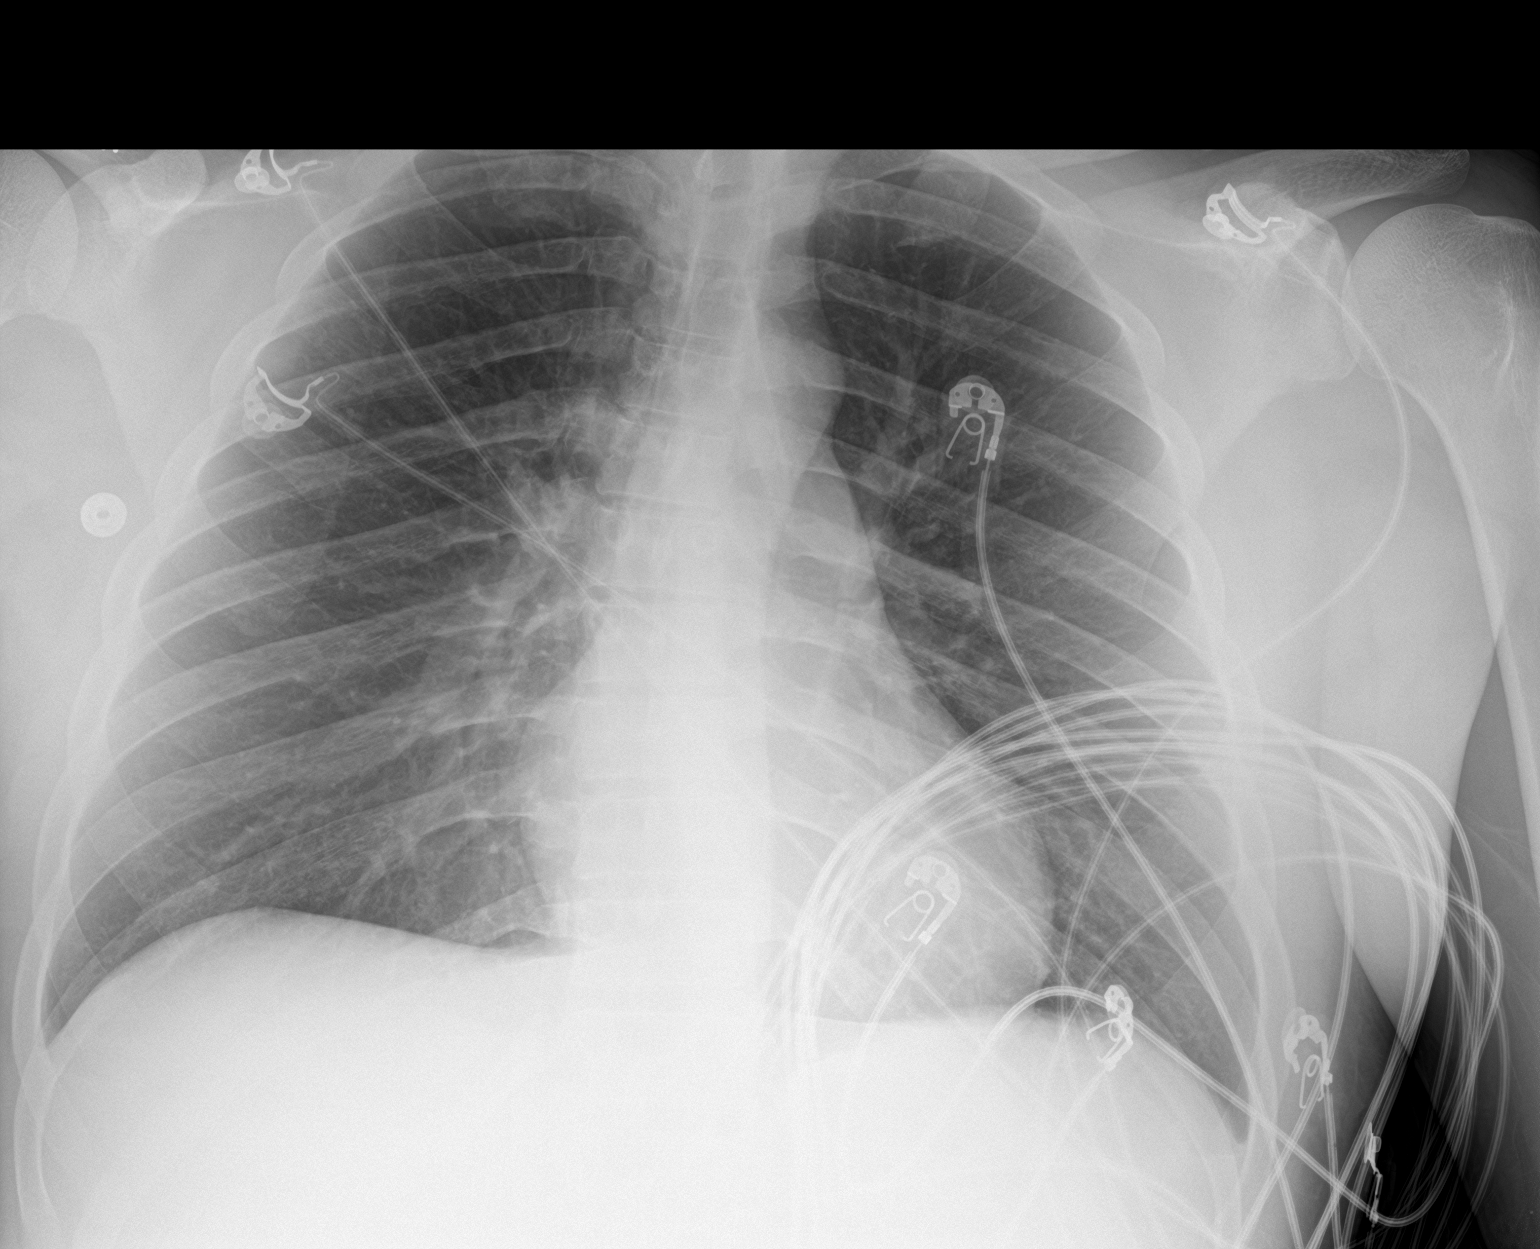

[2 of 2 positions shown; findings below may reference images not displayed]

FINDINGS: Lung volumes are normal. No consolidative airspace disease. No
pleural effusions. No pneumothorax. No pulmonary nodule or mass
noted. Pulmonary vasculature and the cardiomediastinal silhouette
are within normal limits.
IMPRESSION: No radiographic evidence of acute cardiopulmonary disease.

## 2021-08-10 ENCOUNTER — Ambulatory Visit: Payer: Self-pay | Admitting: Infectious Diseases

## 2021-09-06 ENCOUNTER — Other Ambulatory Visit: Payer: Self-pay | Admitting: Pharmacist

## 2021-09-06 ENCOUNTER — Telehealth: Payer: Self-pay

## 2021-09-06 DIAGNOSIS — Z789 Other specified health status: Secondary | ICD-10-CM

## 2021-09-06 DIAGNOSIS — B20 Human immunodeficiency virus [HIV] disease: Secondary | ICD-10-CM

## 2021-09-06 MED ORDER — SPIRONOLACTONE 50 MG PO TABS: 200.0000 mg | ORAL_TABLET | Freq: Every day | ORAL | 0 refills | Status: DC

## 2021-09-06 MED ORDER — BIKTARVY 50-200-25 MG PO TABS: 1.0000 | ORAL_TABLET | Freq: Every day | ORAL | 5 refills | Status: DC

## 2021-09-06 NOTE — Telephone Encounter (Signed)
Patient called requesting refill of Biktarvy, estradiol, and spironolactone. She states she has not been out of Biktarvy at all, despite the fact that it was last ordered last year as a six month supply. Will route to provider.   Sandie Ano, RN

## 2021-09-06 NOTE — Addendum Note (Signed)
Addended by: Linna Hoff D on: 09/06/2021 08:47 AM   Modules accepted: Orders

## 2021-09-06 NOTE — Telephone Encounter (Signed)
Notified patient that medications have been refilled. Reminded her of upcoming appointment with Dr. Everardo All.   Sandie Ano, RN

## 2021-09-06 NOTE — Addendum Note (Signed)
Addended by: Linna Hoff D on: 09/06/2021 09:37 AM   Modules accepted: Orders

## 2021-09-21 ENCOUNTER — Encounter: Payer: Self-pay | Admitting: Endocrinology

## 2021-09-21 ENCOUNTER — Ambulatory Visit (INDEPENDENT_AMBULATORY_CARE_PROVIDER_SITE_OTHER): Payer: Medicaid Other | Admitting: Endocrinology

## 2021-09-21 ENCOUNTER — Other Ambulatory Visit: Payer: Self-pay

## 2021-09-21 VITALS — BP 134/78 | HR 74 | Ht 67.0 in | Wt 239.6 lb

## 2021-09-21 DIAGNOSIS — Z789 Other specified health status: Secondary | ICD-10-CM

## 2021-09-21 LAB — T4, FREE: Free T4: 0.68 ng/dL (ref 0.60–1.60)

## 2021-09-21 LAB — TSH: TSH: 1.57 u[IU]/mL (ref 0.35–5.50)

## 2021-09-21 NOTE — Patient Instructions (Addendum)
Please see a specialist at [facility].  you will receive a phone call, about a day and time for an appointment.   Blood tests are requested for you today.  We'll let you know about the results.   Please come back for a follow-up appointment in 1 year.

## 2021-09-21 NOTE — Progress Notes (Signed)
Subjective:    Patient ID: Rhonda Ayers, adult    DOB: [birth year], 40 y.o.   MRN: [id number]  HPI Pt is referred by Dr Daiva Eves, for transgender state (M to F).  she had puberty at the age of 39.  she has never had surgery for this.  She last went to counseling 2 weeks ago.  she has no children.  she has no h/o DVT, dyslipidemia, liver disease, kidney disease, migraine, sleep apnea, heart disease, CVA, acne, diabetes, HTN, epilepsy, gallbladder disease, blood disorders, osteoporosis, or cancer.  She has been on E2 and aldactone since 2020.  Since then, she has some breast development, and wider hips.  She has gained weight.   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   Work related injury 09/01/2018    Past Surgical History:  Procedure Laterality Date   FOOT SURGERY     NASAL SEPTUM SURGERY      Social History   Socioeconomic History   Marital status: Single    Spouse name: Not on file   Number of children: Not on file   Years of education: Not on file   Highest education level: Not on file  Occupational History   Not on file  Tobacco Use   Smoking status: Light Smoker    Packs/day: 0.10    Types: Cigarettes   Smokeless tobacco: Never   Tobacco comments:    "2 cigarettes a day"   Vaping Use   Vaping Use: Never used  Substance and Sexual Activity   Alcohol use: Not Currently    Alcohol/week: 0.0 standard drinks    Comment: occasional   Drug use: Yes    Types: Marijuana    Comment: has stopped marijuana afer smoking some that was laced   Sexual activity: Never    Partners: Male    Birth  control/protection: Condom    Comment: given condoms  Other Topics Concern   Not on file  Social History Narrative   Not on file   Social Determinants of Health   Financial Resource Strain: Not on file  Food Insecurity: Not on file  Transportation Needs: Not on file  Physical Activity: Not on file  Stress: Not on file  Social Connections: Not on file  Intimate Partner Violence: Not on file    Current Outpatient Medications on File Prior to Visit  Medication Sig Dispense Refill   bictegravir-emtricitabine-tenofovir AF (BIKTARVY) 50-200-25 MG TABS tablet Take 1 tablet by mouth daily. 30 tablet 5   estradiol valerate (DELESTROGEN) 20 MG/ML injection ADMINISTER 1 ML(20 MG) IN THE MUSCLE EVERY 14 DAYS 5 mL 0   spironolactone (ALDACTONE) 50 MG tablet Take 4 tablets (200 mg total) by mouth daily. 120 tablet 0   No current facility-administered medications on file prior to visit.    Allergies  Allergen Reactions   Tramadol Other (See Comments)    "shakes"   Tylenol [Acetaminophen]     Family History  Problem Relation Age of Onset   Diabetes Mother     BP 134/78   Pulse 74   Ht 5\' 7"  (  1.702 m)   Wt 239 lb 9.6 oz (108.7 kg)   SpO2 94%   BMI 37.53 kg/m    Review of Systems Denies headache, sob, nausea, acne, edema, myalgias, and anxiety.      Objective:   Physical Exam VS: see vs page GEN: no distress HEAD: head: no deformity eyes: no periorbital swelling, no proptosis external nose and ears are normal NECK: supple, thyroid is not enlarged.   CHEST WALL: no deformity BREASTS: tanner 4 LUNGS: clear to auscultation CV: reg rate and rhythm, no murmur.  GENITALIA: Normal female testicles, scrotum, and penis MUSCULOSKELETAL: gait is normal and steady EXTEMITIES: no deformity.  no leg edema NEURO:  readily moves all 4's.  sensation is intact to touch on all 4's SKIN:  Normal texture and temperature.  No rash or suspicious lesion is visible.  Female pattern of terminal  hair.   NODES:  None palpable at the neck PSYCH: alert, well-oriented.  Does not appear anxious nor depressed.   Testosterone=78 E2=315  Lab Results  Component Value Date   CREATININE 0.90 03/17/2021   BUN 12 03/17/2021   NA 136 03/17/2021   K 3.9 03/17/2021   CL 103 03/17/2021   CO2 25 03/17/2021   Lab Results  Component Value Date   ALT 14 03/17/2021   AST 17 03/17/2021   ALKPHOS 54 03/12/2021   BILITOT 0.6 03/17/2021   Lab Results  Component Value Date   CHOL 189 08/04/2018   HDL 37 (L) 08/04/2018   LDLCALC 126 (H) 08/04/2018   TRIG 143 08/04/2018   CHOLHDL 5.1 (H) 08/04/2018   I have reviewed outside records, and summarized: Pt was noted to have transgender state,  and referred here.  Pt had previously seen different endocrinologist, bout could not remember who     Assessment & Plan:  Transgender state, uncontrolled.  Please continue the same aldactone.   Patient Instructions  [facility] a specialist at [facility].  you will receive a phone call, about a day and time for an appointment.   Blood tests are requested for you today.  We'll let you know about the results.   Please come back for a follow-up appointment in 1 year.

## 2021-09-24 ENCOUNTER — Other Ambulatory Visit: Payer: Self-pay

## 2021-09-24 ENCOUNTER — Ambulatory Visit (INDEPENDENT_AMBULATORY_CARE_PROVIDER_SITE_OTHER): Payer: Self-pay

## 2021-09-24 ENCOUNTER — Encounter (HOSPITAL_COMMUNITY): Payer: Self-pay | Admitting: Emergency Medicine

## 2021-09-24 ENCOUNTER — Ambulatory Visit (HOSPITAL_COMMUNITY)
Admission: EM | Admit: 2021-09-24 | Discharge: 2021-09-24 | Disposition: A | Discharge status: Home / Self Care | Payer: Self-pay | Attending: Urgent Care | Admitting: Urgent Care

## 2021-09-24 ENCOUNTER — Ambulatory Visit (HOSPITAL_COMMUNITY): Payer: Self-pay

## 2021-09-24 DIAGNOSIS — G629 Polyneuropathy, unspecified: Secondary | ICD-10-CM

## 2021-09-24 DIAGNOSIS — M79641 Pain in right hand: Secondary | ICD-10-CM

## 2021-09-24 DIAGNOSIS — M792 Neuralgia and neuritis, unspecified: Secondary | ICD-10-CM

## 2021-09-24 MED ORDER — GABAPENTIN 100 MG PO CAPS: 100.0000 mg | ORAL_CAPSULE | Freq: Two times a day (BID) | ORAL | 0 refills | Status: DC

## 2021-09-24 NOTE — ED Triage Notes (Signed)
Pt presents with right hand pain that radiates up right arm to side of head. States has hx of surgery and nerve pain.

## 2021-09-24 NOTE — Discharge Instructions (Signed)
May do short trial of gabapentin, side effects of sedation or dizziness discussed. May also try OTC naproxen twice daily Follow up with PCP tomorrow to discuss labs and further imaging (MRI) that may be useful

## 2021-09-25 LAB — TESTOSTERONE,FREE AND TOTAL
Testosterone, Free: 0.7 pg/mL — ABNORMAL LOW (ref 8.7–25.1)
Testosterone: 11 ng/dL — ABNORMAL LOW (ref 264–916)
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
## 2021-09-29 ENCOUNTER — Other Ambulatory Visit: Payer: Self-pay | Admitting: Endocrinology

## 2021-09-29 DIAGNOSIS — Z789 Other specified health status: Secondary | ICD-10-CM

## 2021-09-29 LAB — ESTRADIOL, FREE
Estradiol, Free: 9.86 pg/mL — ABNORMAL HIGH
Estradiol: 678 pg/mL — ABNORMAL HIGH (ref <=29)

## 2021-09-29 MED ORDER — ESTRADIOL VALERATE 20 MG/ML IM OIL: 20.0000 mg | TOPICAL_OIL | INTRAMUSCULAR | 0 refills | Status: DC

## 2021-10-11 ENCOUNTER — Ambulatory Visit: Payer: Medicaid Other | Admitting: Internal Medicine

## 2021-10-11 ENCOUNTER — Other Ambulatory Visit: Payer: Self-pay

## 2021-10-11 ENCOUNTER — Ambulatory Visit: Payer: Medicaid Other | Admitting: Pharmacist

## 2021-10-11 ENCOUNTER — Telehealth: Payer: Self-pay

## 2021-10-11 DIAGNOSIS — Z79899 Other long term (current) drug therapy: Secondary | ICD-10-CM

## 2021-10-11 DIAGNOSIS — Z113 Encounter for screening for infections with a predominantly sexual mode of transmission: Secondary | ICD-10-CM

## 2021-10-11 DIAGNOSIS — B20 Human immunodeficiency virus [HIV] disease: Secondary | ICD-10-CM

## 2021-10-11 NOTE — Telephone Encounter (Signed)
A1C added to routine lab orders.

## 2021-10-11 NOTE — Telephone Encounter (Signed)
Patient requesting sooner lab and provider appointment. Prefers to be seen sooner than what was recommended by Dr. Ninetta Lights, patient scheduled for labs and financial appt. And sooner f/u visit with Dr. Ninetta Lights. Patient also needs referral to PCP as she would like to have her A1C checked as diabetes "runs in her family". Routing to provider if ok to add A1C to routine labs.  Rhonda Ayers

## 2021-10-18 ENCOUNTER — Other Ambulatory Visit: Payer: Medicaid Other

## 2021-10-18 ENCOUNTER — Ambulatory Visit: Payer: Medicaid Other

## 2021-10-27 ENCOUNTER — Ambulatory Visit: Payer: Medicaid Other | Admitting: Infectious Diseases

## 2021-10-27 ENCOUNTER — Telehealth: Payer: Self-pay

## 2021-10-27 ENCOUNTER — Ambulatory Visit: Payer: Medicaid Other

## 2021-10-27 NOTE — Telephone Encounter (Signed)
Attempted to contact patient regarding missed appointment scheduled today. If patient returns calls would like to offer appointment. Rhonda Ayers

## 2021-10-31 ENCOUNTER — Telehealth: Payer: Self-pay

## 2021-10-31 ENCOUNTER — Ambulatory Visit: Payer: Medicaid Other | Admitting: Infectious Diseases

## 2021-10-31 ENCOUNTER — Ambulatory Visit: Payer: Medicaid Other

## 2021-10-31 NOTE — Telephone Encounter (Signed)
Called patient to see if she would be able to make it to today's appointment, no answer. Voicemail box full.   Sandie Ano, RN

## 2021-11-15 ENCOUNTER — Encounter: Payer: Self-pay | Admitting: Infectious Diseases

## 2021-11-15 ENCOUNTER — Ambulatory Visit: Payer: Self-pay

## 2021-11-15 ENCOUNTER — Other Ambulatory Visit: Payer: Self-pay

## 2021-11-15 DIAGNOSIS — B20 Human immunodeficiency virus [HIV] disease: Secondary | ICD-10-CM

## 2021-11-15 DIAGNOSIS — Z79899 Other long term (current) drug therapy: Secondary | ICD-10-CM

## 2021-11-15 DIAGNOSIS — Z113 Encounter for screening for infections with a predominantly sexual mode of transmission: Secondary | ICD-10-CM

## 2021-11-16 ENCOUNTER — Encounter: Payer: Self-pay | Admitting: Infectious Diseases

## 2021-11-16 LAB — T-HELPER CELL (CD4) - (RCID CLINIC ONLY)
CD4 % Helper T Cell: 35 % (ref 33–65)
CD4 T Cell Abs: 550 /uL (ref 400–1790)

## 2021-11-17 ENCOUNTER — Other Ambulatory Visit: Payer: Self-pay | Admitting: Endocrinology

## 2021-11-17 ENCOUNTER — Ambulatory Visit (INDEPENDENT_AMBULATORY_CARE_PROVIDER_SITE_OTHER): Payer: Self-pay

## 2021-11-17 ENCOUNTER — Telehealth: Payer: Self-pay

## 2021-11-17 ENCOUNTER — Encounter: Payer: Self-pay | Admitting: Endocrinology

## 2021-11-17 ENCOUNTER — Other Ambulatory Visit: Payer: Self-pay

## 2021-11-17 DIAGNOSIS — Z789 Other specified health status: Secondary | ICD-10-CM

## 2021-11-17 DIAGNOSIS — A539 Syphilis, unspecified: Secondary | ICD-10-CM

## 2021-11-17 LAB — LIPID PANEL
Cholesterol: 245 mg/dL — ABNORMAL HIGH (ref <200)
HDL: 64 mg/dL (ref >=50)
LDL Cholesterol (Calc): 156 mg/dL (calc) — ABNORMAL HIGH
Non-HDL Cholesterol (Calc): 181 mg/dL (calc) — ABNORMAL HIGH (ref <130)
Total CHOL/HDL Ratio: 3.8 (calc) (ref <5.0)
Triglycerides: 129 mg/dL (ref <150)

## 2021-11-17 LAB — CBC WITH DIFFERENTIAL/PLATELET
Absolute Monocytes: 372 cells/uL (ref 200–950)
Basophils Absolute: 40 cells/uL (ref 0–200)
Basophils Relative: 1 %
Eosinophils Absolute: 112 cells/uL (ref 15–500)
Eosinophils Relative: 2.8 %
HCT: 40.4 % (ref 35.0–45.0)
Hemoglobin: 13.8 g/dL (ref 11.7–15.5)
Lymphs Abs: 1640 cells/uL (ref 850–3900)
MCH: 31.2 pg (ref 27.0–33.0)
MCHC: 34.2 g/dL (ref 32.0–36.0)
MCV: 91.4 fL (ref 80.0–100.0)
MPV: 10.5 fL (ref 7.5–12.5)
Monocytes Relative: 9.3 %
Neutro Abs: 1836 cells/uL (ref 1500–7800)
Neutrophils Relative %: 45.9 %
Platelets: 235 10*3/uL (ref 140–400)
RBC: 4.42 10*6/uL (ref 3.80–5.10)
RDW: 12.8 % (ref 11.0–15.0)
Total Lymphocyte: 41 %
WBC: 4 10*3/uL (ref 3.8–10.8)

## 2021-11-17 LAB — HIV-1 RNA QUANT-NO REFLEX-BLD
HIV 1 RNA Quant: <20 Copies/mL — ABNORMAL HIGH
HIV-1 RNA Quant, Log: <1.3 Log cps/mL — ABNORMAL HIGH

## 2021-11-17 LAB — COMPLETE METABOLIC PANEL WITH GFR
AG Ratio: 1.5 (calc) (ref 1.0–2.5)
ALT: 17 U/L (ref 6–29)
AST: 20 U/L (ref 10–30)
Albumin: 4.3 g/dL (ref 3.6–5.1)
Alkaline phosphatase (APISO): 59 U/L (ref 31–125)
BUN: 11 mg/dL (ref 7–25)
CO2: 24 mmol/L (ref 20–32)
Calcium: 9.4 mg/dL (ref 8.6–10.2)
Chloride: 104 mmol/L (ref 98–110)
Creat: 0.85 mg/dL (ref 0.50–0.97)
Globulin: 2.8 g/dL (calc) (ref 1.9–3.7)
Glucose, Bld: 90 mg/dL (ref 65–99)
Potassium: 4.1 mmol/L (ref 3.5–5.3)
Sodium: 137 mmol/L (ref 135–146)
Total Bilirubin: 0.4 mg/dL (ref 0.2–1.2)
Total Protein: 7.1 g/dL (ref 6.1–8.1)
eGFR: 90 mL/min/{1.73_m2} (ref >=60)

## 2021-11-17 LAB — RPR TITER: RPR Titer: 1:16 {titer} — ABNORMAL HIGH

## 2021-11-17 LAB — HEMOGLOBIN A1C
Hgb A1c MFr Bld: 5.7 % of total Hgb — ABNORMAL HIGH (ref <5.7)
Mean Plasma Glucose: 117 mg/dL
eAG (mmol/L): 6.5 mmol/L

## 2021-11-17 LAB — RPR: RPR Ser Ql: REACTIVE — AB

## 2021-11-17 LAB — FLUORESCENT TREPONEMAL AB(FTA)-IGG-BLD: Fluorescent Treponemal ABS: REACTIVE — AB

## 2021-11-17 MED ORDER — ESTRADIOL VALERATE 20 MG/ML IM OIL: 20.0000 mg | TOPICAL_OIL | INTRAMUSCULAR | 2 refills | Status: DC

## 2021-11-17 MED ORDER — PENICILLIN G BENZATHINE 1200000 UNIT/2ML IM SUSY
1.2000 10*6.[IU] | PREFILLED_SYRINGE | Freq: Once | INTRAMUSCULAR | Status: AC
Start: 2021-11-17 — End: 2021-11-17
  Administered 2021-11-17: 1.2 10*6.[IU] via INTRAMUSCULAR

## 2021-11-17 MED ORDER — SPIRONOLACTONE 50 MG PO TABS: 200.0000 mg | ORAL_TABLET | Freq: Every day | ORAL | 3 refills | Status: DC

## 2021-11-17 NOTE — Telephone Encounter (Signed)
Called patient to schedule appt for syphillis treatment. Patient is scheduled to come in today at 2 for Bicillin #1. Would like to hold off on scheduling #2 and #3 until her appt with Durwin Nora, NP to discuss lab results. Patient states that she is not sexually active and is not sure how she is pos for syphillis.  Moved appt with Np to 2/7.  Juanita Laster, RMA

## 2021-11-17 NOTE — Telephone Encounter (Signed)
Pt is requesting a refill on spironolactone and estradiol. Pt ws last seen 09/21/2021. I seen that you previously fill the estradiol and spironolactone is in Historical.  Please Advise

## 2021-11-17 NOTE — Telephone Encounter (Signed)
-----   Message from Ginnie Smart, MD sent at 11/17/2021  9:33 AM EST ----- Needs bicllin Pen G IM 2.4 million units weekly x 3

## 2021-11-21 ENCOUNTER — Ambulatory Visit: Payer: Medicaid Other | Admitting: Infectious Diseases

## 2021-11-21 ENCOUNTER — Telehealth: Payer: Self-pay

## 2021-11-21 ENCOUNTER — Ambulatory Visit: Payer: Medicaid Other | Admitting: Infectious Disease

## 2021-11-21 NOTE — Telephone Encounter (Signed)
Patient wants to inquire on the status of his referral to [facility] that was was discussed at his 09/21/21 appt. Will update patient once status is received

## 2021-11-23 ENCOUNTER — Encounter: Payer: Self-pay | Admitting: Infectious Diseases

## 2021-11-23 ENCOUNTER — Other Ambulatory Visit: Payer: Self-pay

## 2021-11-23 ENCOUNTER — Ambulatory Visit (INDEPENDENT_AMBULATORY_CARE_PROVIDER_SITE_OTHER): Payer: Self-pay | Admitting: Infectious Diseases

## 2021-11-23 VITALS — BP 115/80 | HR 75 | Temp 98.7°F | Wt 244.0 lb

## 2021-11-23 DIAGNOSIS — R7303 Prediabetes: Secondary | ICD-10-CM | POA: Insufficient documentation

## 2021-11-23 DIAGNOSIS — A630 Anogenital (venereal) warts: Secondary | ICD-10-CM

## 2021-11-23 DIAGNOSIS — Z789 Other specified health status: Secondary | ICD-10-CM

## 2021-11-23 DIAGNOSIS — A529 Late syphilis, unspecified: Secondary | ICD-10-CM

## 2021-11-23 HISTORY — DX: Prediabetes: R73.03

## 2021-11-23 MED ORDER — DOXYCYCLINE HYCLATE 100 MG PO TABS: 100.0000 mg | ORAL_TABLET | Freq: Two times a day (BID) | ORAL | 0 refills | Status: AC

## 2021-11-23 NOTE — Progress Notes (Signed)
Subjective:    Patient ID: Rhonda Ayers, adult    DOB: 03-24-1981, 41 y.o.   MRN: [id number]  CC:  Concerned over syphilis test and A1C recently. Anogenital warts/moles.    HPI: Special is a 41 y.o. adult with well controlled HIV on once daily Biktarvy with VL < 20 and CD4 550.   Recently her syphilis titer came back 1:16 from 1:2. She assures me she has not had any romantic contact of any kind and is concerned it keeps reactivating. She was treated last for syphilis in 2018 from what I can see in the chart.   She is asking about referral to [facility] - has not heard anything from them. Working with Dr. Everardo All with [facility] for now. She has noticed breast development and wonders if some of her weight gain may in part be due to the estrogen hormones. She has lost about 15 lbs though recently through adding exercise the last 2 months. She is in a better head space and feels more at peace lately. She is concerned about her A1C being prediabetic as she has this run in her family.   She also reports that over the last few weeks she has noticed more bumps that were in and around her rectum that look like moles. She has had a history of HPV / anogenital wart removal in the past. Says that one of her previous anogenital wart tested positive for syphilis previously.    Allergies  Allergen Reactions   Tramadol Other (See Comments)    "shakes"   Tylenol [Acetaminophen]      Outpatient Medications Prior to Visit  Medication Sig Dispense Refill   bictegravir-emtricitabine-tenofovir AF (BIKTARVY) 50-200-25 MG TABS tablet Take 1 tablet by mouth daily. 30 tablet 5   estradiol valerate (DELESTROGEN) 20 MG/ML injection Inject 1 mL (20 mg total) into the muscle every 21 ( twenty-one) days. And syringes 1 per 21 days 5 mL 2   spironolactone (ALDACTONE) 50 MG tablet Take 4 tablets (200 mg total) by mouth daily. 360 tablet 3   gabapentin (NEURONTIN) 100 MG capsule Take 1  capsule (100 mg total) by mouth 2 (two) times daily for 5 days. 10 capsule 0   No facility-administered medications prior to visit.     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  HENT:  Negative for sore throat.   Eyes:  Negative for visual disturbance.  Gastrointestinal:  Negative for abdominal pain, anal bleeding and rectal pain.  Genitourinary:  Negative for dysuria, genital sores, penile discharge, penile pain, scrotal swelling and testicular pain.       Anogenital moles   Musculoskeletal:  Negative for arthralgias and joint swelling.  Skin:  Negative for rash.  Neurological:  Negative for headaches.  Hematological:  Negative for adenopathy.  Objective:    BP 115/80    Pulse 75    Temp 98.7 F (37.1 C) (Temporal)    Wt 244 lb (110.7 kg)    BMI 38.22 kg/m  Nursing note and vital signs reviewed.   Physical Exam Constitutional:      Appearance: Normal appearance. She is not ill-appearing.  HENT:     Head: Normocephalic.     Mouth/Throat:     Mouth: Mucous membranes are moist.     Pharynx: Oropharynx is clear.  Eyes:     General: No scleral icterus. Pulmonary:     Effort: Pulmonary effort is normal.  Musculoskeletal:        General: Normal range of motion.     Cervical back: Normal range of motion.  Skin:    Coloration: Skin is not jaundiced or pale.  Neurological:     Mental Status:  She is alert and oriented to person, place, and time.  Psychiatric:        Mood and Affect: Mood normal.        Judgment: Judgment normal.        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 I think the initial referral went only to Endocrinology, whom she is working with now.       Relevant Orders   Ambulatory referral to Urology   Late syphilis    Reviewed historical RPR titers. She did have a significant increase in her titer > 4 fold that I explained is concerning for infection that needs treatment. I am not aware of syphilis "re-activating" but very rarely this may be transmitted through non-sexual contact. She is still with pain from original injections. Offered 4 week course of doxycycline for alternative treatment, which she accepted.  Counseled on how to take medications and possible s/e  Will repeat titer in 65m at return visit.        HPV (human papilloma virus) anogenital infection - Primary    She describes moles around perianal region - could be condyloma lata vs traditional warts. With her history and concerns we       Relevant Orders   Ambulatory referral to General Surgery   Pre-diabetes    Discussed today during the visit. Nutrition counseling discussed. She is down 15 lbs with adding in exercise to her routine which I am happy to see! Will have her back in 6 months to repeat her A1C labs and follow up with lifestyle changes.  Would be open to giving  her metformin for treatment also to help with insulin resistance.  Provided information online she can look into to help with reading nutrition labels and nutrition. Suggested to start with reducing juice intake and then working to lower flour containing foods as a first start while optimizing non-starchy veggies and proteins. Will continue to discuss at next OV.        Rexene Alberts, MSN, NP-C [facility] Infectious [facility]  [email or web address] Pager: [phone] Office: [phone] [facility] Main Line: [phone]

## 2021-11-23 NOTE — Assessment & Plan Note (Signed)
Discussed today during the visit. Nutrition counseling discussed. She is down 15 lbs with adding in exercise to her routine which I am happy to see! Will have her back in 6 months to repeat her A1C labs and follow up with lifestyle changes.  Would be open to giving her metformin for treatment also to help with insulin resistance.  Provided information online she can look into to help with reading nutrition labels and nutrition. Suggested to start with reducing juice intake and then working to lower flour containing foods as a first start while optimizing non-starchy veggies and proteins. Will continue to discuss at next OV.

## 2021-11-23 NOTE — Patient Instructions (Addendum)
Doxycycline is the antibiotic I want you to start taking. Take one pill twice a day with food for 4 weeks.   Your A1C is in the pre-diabetic range   American Diabetes Association online is a good resource to continue education about diet treatment to lower your A1C. The fact that you are already down over 10 lbs however is a great start!.   iTendency.uy --> this is nice tool to help you learn to read food labels more also.   Will refer you to general surgery to help investigate what we talked about for you.   Will follow up with the [facility] clinic and send you an update via your MyChart.

## 2021-11-23 NOTE — Assessment & Plan Note (Signed)
Will re-enter referral with request to have [facility] clinic work with her - I will send her an update when this has been sent. I think the initial referral went only to Endocrinology, whom she is working with now.

## 2021-11-23 NOTE — Assessment & Plan Note (Signed)
She describes moles around perianal region - could be condyloma lata vs traditional warts. With her history and concerns we

## 2021-11-23 NOTE — Assessment & Plan Note (Signed)
Reviewed historical RPR titers. She did have a significant increase in her titer > 4 fold that I explained is concerning for infection that needs treatment. I am not aware of syphilis "re-activating" but very rarely this may be transmitted through non-sexual contact. She is still with pain from original injections. Offered 4 week course of doxycycline for alternative treatment, which she accepted.  Counseled on how to take medications and possible s/e  Will repeat titer in 71m at return visit.

## 2021-11-29 ENCOUNTER — Ambulatory Visit: Payer: Medicaid Other | Admitting: Infectious Diseases

## 2021-12-04 ENCOUNTER — Encounter: Payer: Self-pay | Admitting: Infectious Disease

## 2021-12-29 ENCOUNTER — Telehealth: Payer: Self-pay

## 2021-12-29 NOTE — Telephone Encounter (Signed)
Patient called office to follow up on referral from February regarding anal warts. Per referral notes referral was faxed in February. Provided patient with [facility] contact information and advised she follow up with them .  ?Rhonda Ayers, RMA ? ?

## 2022-01-05 ENCOUNTER — Other Ambulatory Visit: Payer: Self-pay

## 2022-01-05 DIAGNOSIS — Z79899 Other long term (current) drug therapy: Secondary | ICD-10-CM

## 2022-01-05 DIAGNOSIS — Z113 Encounter for screening for infections with a predominantly sexual mode of transmission: Secondary | ICD-10-CM

## 2022-01-05 DIAGNOSIS — B20 Human immunodeficiency virus [HIV] disease: Secondary | ICD-10-CM

## 2022-01-11 ENCOUNTER — Other Ambulatory Visit: Payer: Medicaid Other

## 2022-01-12 ENCOUNTER — Other Ambulatory Visit: Payer: Self-pay

## 2022-01-25 ENCOUNTER — Encounter: Payer: Medicaid Other | Admitting: Infectious Disease

## 2022-01-29 ENCOUNTER — Encounter: Payer: Medicaid Other | Admitting: Infectious Disease

## 2022-01-31 ENCOUNTER — Ambulatory Visit: Payer: Medicaid Other | Admitting: Infectious Diseases

## 2022-02-01 ENCOUNTER — Other Ambulatory Visit: Payer: Medicaid Other

## 2022-02-06 ENCOUNTER — Ambulatory Visit: Payer: Self-pay | Admitting: Endocrinology

## 2022-02-16 IMAGING — DX DG HAND COMPLETE 3+V*R*
3 series · 3 of 3 positions shown · non-contrast
Comparison: None.

CLINICAL DATA: Right hand pain, swelling.  Trauma 2 years ago

EXAM:
RIGHT HAND - COMPLETE 3+ VIEW

[hand pa]
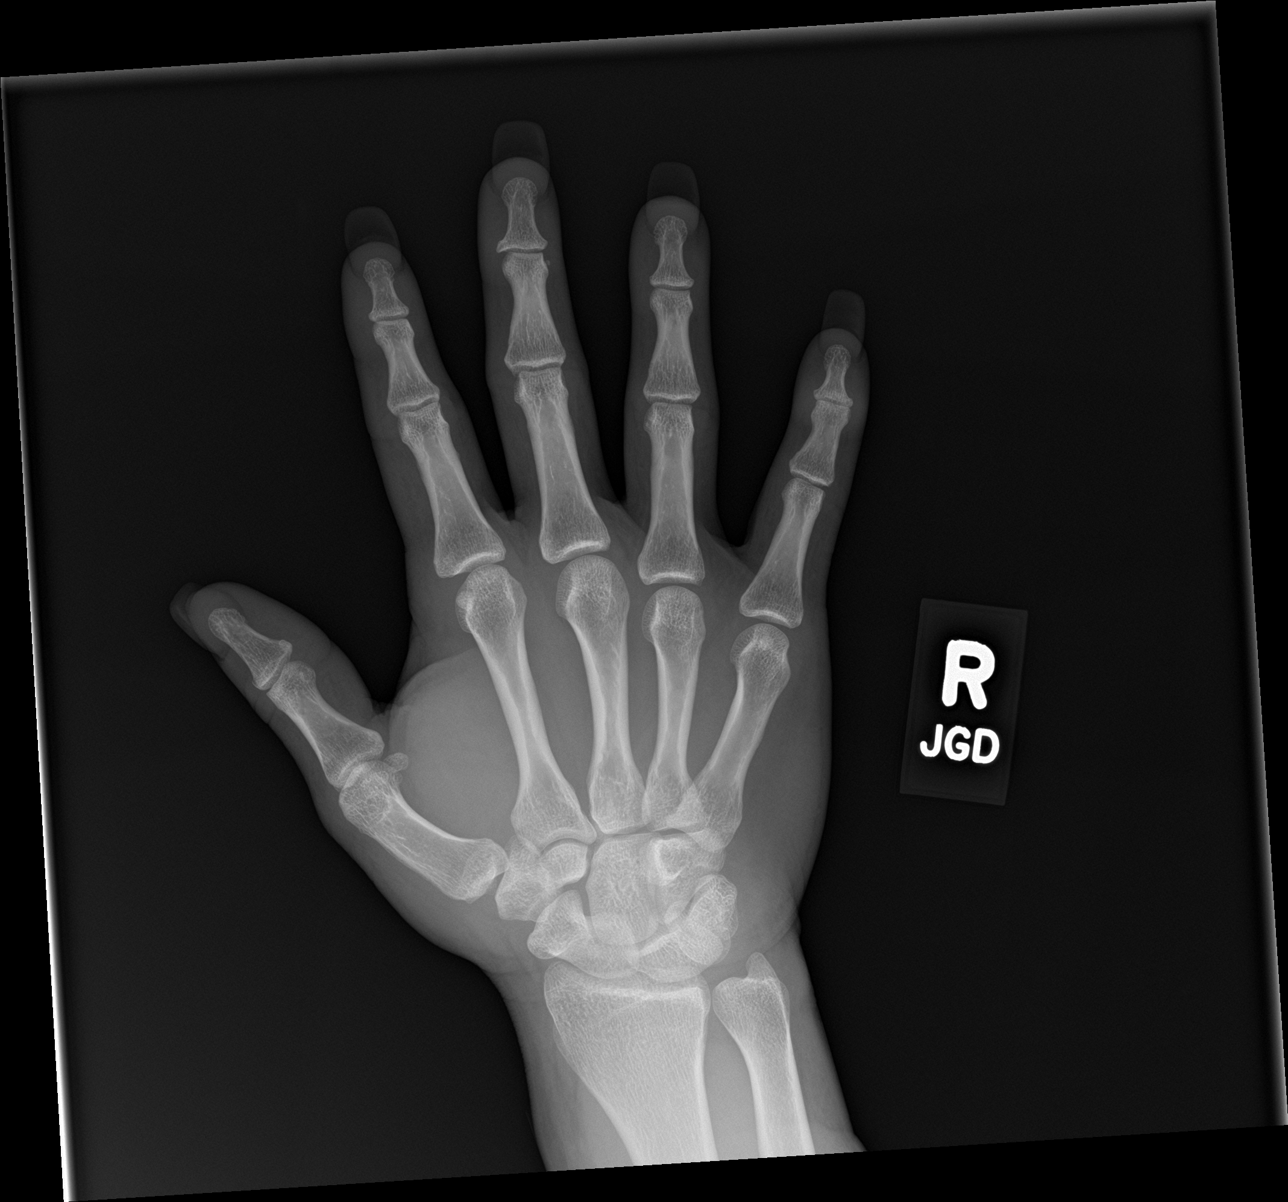

[hand obl (1 of 2)]
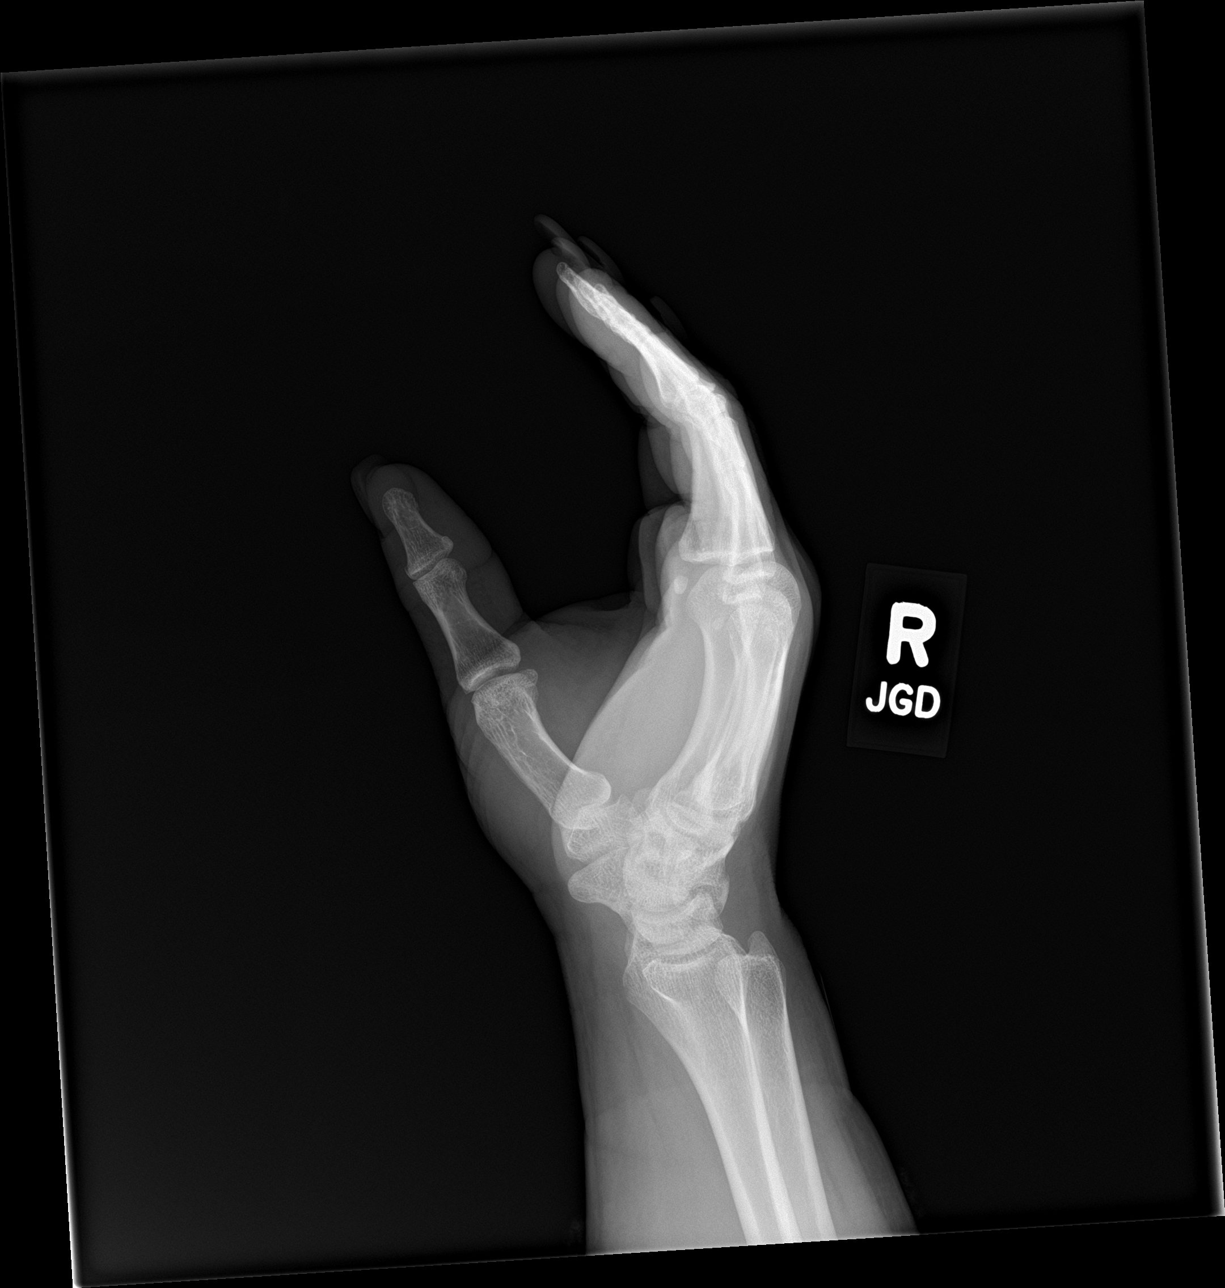

[hand obl (2 of 2)]
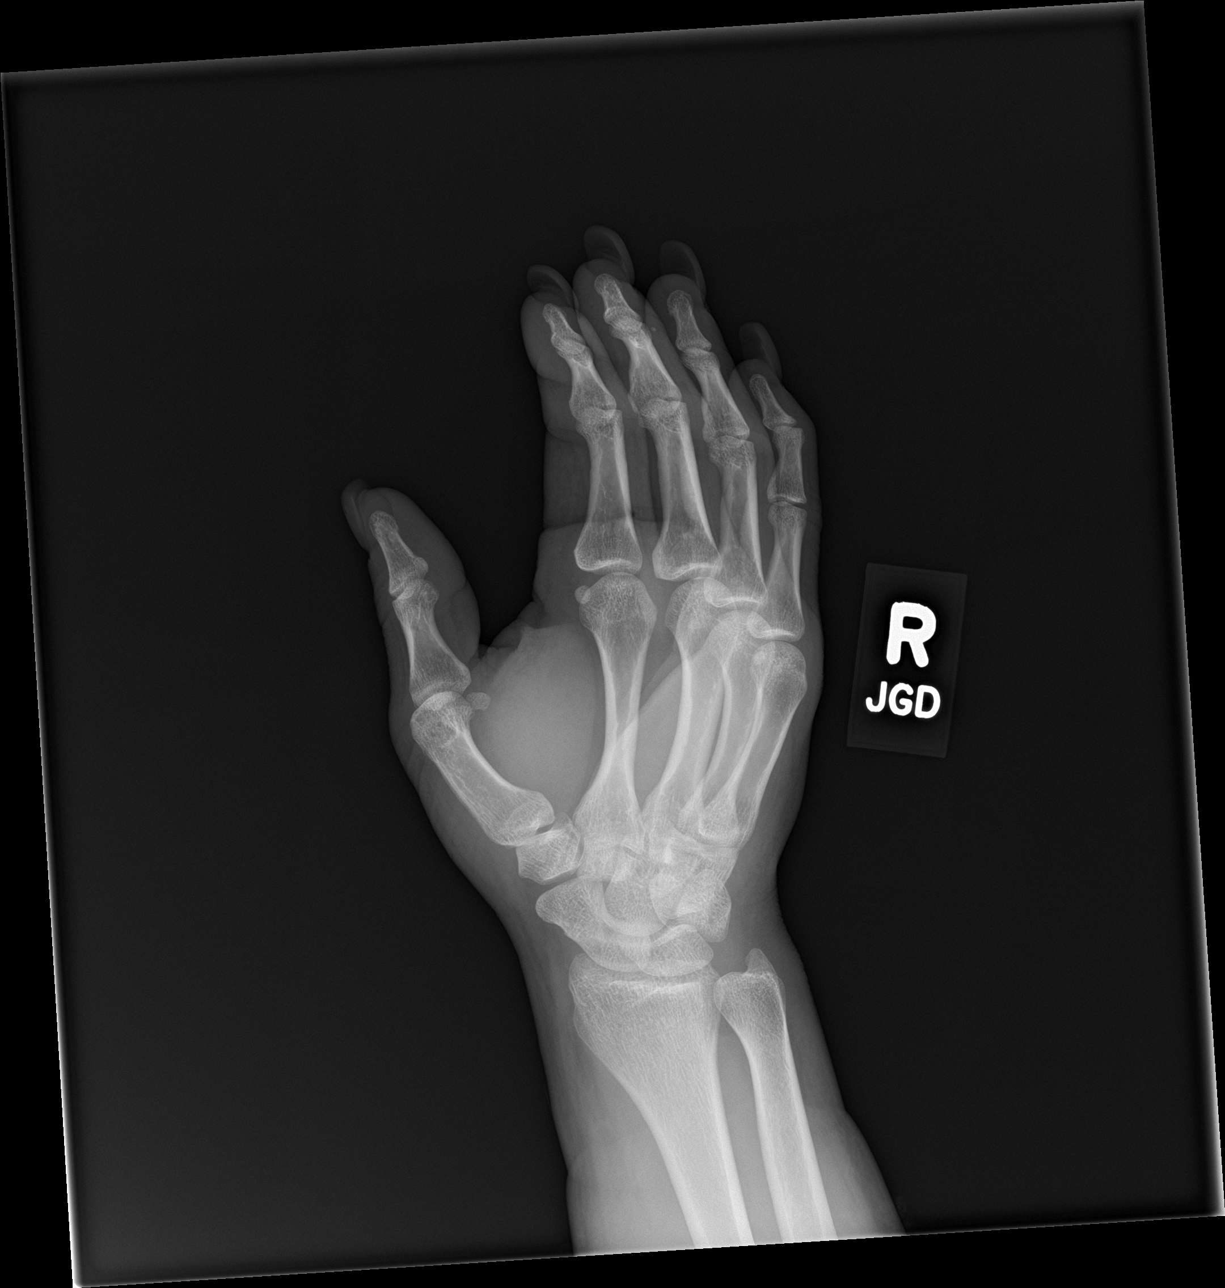

[3 of 3 positions shown; findings below may reference images not displayed]

FINDINGS: There is no evidence of fracture or dislocation. There is no
evidence of arthropathy or other focal bone abnormality. Soft
tissues are unremarkable.
IMPRESSION: Negative.

## 2022-03-07 ENCOUNTER — Encounter: Payer: Self-pay | Admitting: Infectious Diseases

## 2022-03-07 ENCOUNTER — Other Ambulatory Visit: Payer: Self-pay

## 2022-03-07 DIAGNOSIS — Z789 Other specified health status: Secondary | ICD-10-CM

## 2022-03-07 MED ORDER — ESTRADIOL VALERATE 20 MG/ML IM OIL: 20.0000 mg | TOPICAL_OIL | INTRAMUSCULAR | 2 refills | Status: DC

## 2022-04-02 ENCOUNTER — Telehealth: Payer: Self-pay

## 2022-04-02 DIAGNOSIS — B20 Human immunodeficiency virus [HIV] disease: Secondary | ICD-10-CM

## 2022-04-02 MED ORDER — BIKTARVY 50-200-25 MG PO TABS: 1.0000 | ORAL_TABLET | Freq: Every day | ORAL | 1 refills | Status: DC

## 2022-04-02 NOTE — Telephone Encounter (Signed)
Patient needs refills of Biktarvy, due for appointment.   Sandie Ano, RN

## 2022-04-20 ENCOUNTER — Emergency Department (HOSPITAL_COMMUNITY): Admission: EM | Admit: 2022-04-20 | Payer: Medicaid Other | Source: Home / Self Care

## 2022-04-20 NOTE — ED Notes (Signed)
Pt called for triage, no response x 1 

## 2022-04-27 ENCOUNTER — Other Ambulatory Visit: Payer: Self-pay

## 2022-04-27 ENCOUNTER — Emergency Department (HOSPITAL_COMMUNITY): Payer: Medicaid Other

## 2022-04-27 ENCOUNTER — Emergency Department (HOSPITAL_COMMUNITY)
Admission: EM | Admit: 2022-04-27 | Discharge: 2022-04-27 | Discharge status: Against Medical Advice | Payer: Medicaid Other | Attending: Emergency Medicine | Admitting: Emergency Medicine

## 2022-04-27 ENCOUNTER — Encounter (HOSPITAL_COMMUNITY): Payer: Self-pay

## 2022-04-27 DIAGNOSIS — R718 Other abnormality of red blood cells: Secondary | ICD-10-CM | POA: Insufficient documentation

## 2022-04-27 DIAGNOSIS — R072 Precordial pain: Secondary | ICD-10-CM | POA: Insufficient documentation

## 2022-04-27 DIAGNOSIS — Z21 Asymptomatic human immunodeficiency virus [HIV] infection status: Secondary | ICD-10-CM | POA: Insufficient documentation

## 2022-04-27 DIAGNOSIS — F17210 Nicotine dependence, cigarettes, uncomplicated: Secondary | ICD-10-CM | POA: Insufficient documentation

## 2022-04-27 DIAGNOSIS — R202 Paresthesia of skin: Secondary | ICD-10-CM | POA: Insufficient documentation

## 2022-04-27 DIAGNOSIS — R944 Abnormal results of kidney function studies: Secondary | ICD-10-CM | POA: Insufficient documentation

## 2022-04-27 DIAGNOSIS — R739 Hyperglycemia, unspecified: Secondary | ICD-10-CM | POA: Insufficient documentation

## 2022-04-27 DIAGNOSIS — R42 Dizziness and giddiness: Secondary | ICD-10-CM | POA: Insufficient documentation

## 2022-04-27 DIAGNOSIS — R61 Generalized hyperhidrosis: Secondary | ICD-10-CM | POA: Insufficient documentation

## 2022-04-27 DIAGNOSIS — F1910 Other psychoactive substance abuse, uncomplicated: Secondary | ICD-10-CM

## 2022-04-27 LAB — CBC WITH DIFFERENTIAL/PLATELET
Abs Immature Granulocytes: 0.02 10*3/uL (ref 0.00–0.07)
Basophils Absolute: 0 10*3/uL (ref 0.0–0.1)
Basophils Relative: 1 %
Eosinophils Absolute: 0.1 10*3/uL (ref 0.0–0.5)
Eosinophils Relative: 2 %
HCT: 44.1 % (ref 36.0–46.0)
Hemoglobin: 15.1 g/dL — ABNORMAL HIGH (ref 12.0–15.0)
Immature Granulocytes: 0 %
Lymphocytes Relative: 35 %
Lymphs Abs: 2.4 10*3/uL (ref 0.7–4.0)
MCH: 31.7 pg (ref 26.0–34.0)
MCHC: 34.2 g/dL (ref 30.0–36.0)
MCV: 92.5 fL (ref 80.0–100.0)
Monocytes Absolute: 0.5 10*3/uL (ref 0.1–1.0)
Monocytes Relative: 7 %
Neutro Abs: 3.9 10*3/uL (ref 1.7–7.7)
Neutrophils Relative %: 55 %
Platelets: 274 10*3/uL (ref 150–400)
RBC: 4.77 MIL/uL (ref 3.87–5.11)
RDW: 13 % (ref 11.5–15.5)
WBC: 6.9 10*3/uL (ref 4.0–10.5)
nRBC: 0 % (ref 0.0–0.2)

## 2022-04-27 LAB — COMPREHENSIVE METABOLIC PANEL
ALT: 34 U/L (ref 0–44)
AST: 30 U/L (ref 15–41)
Albumin: 4 g/dL (ref 3.5–5.0)
Alkaline Phosphatase: 52 U/L (ref 38–126)
Anion gap: 12 (ref 5–15)
BUN: 12 mg/dL (ref 6–20)
CO2: 23 mmol/L (ref 22–32)
Calcium: 9.7 mg/dL (ref 8.9–10.3)
Chloride: 102 mmol/L (ref 98–111)
Creatinine, Ser: 1.03 mg/dL — ABNORMAL HIGH (ref 0.44–1.00)
GFR, Estimated: >60 mL/min (ref >60)
Glucose, Bld: 138 mg/dL — ABNORMAL HIGH (ref 70–99)
Potassium: 4.3 mmol/L (ref 3.5–5.1)
Sodium: 137 mmol/L (ref 135–145)
Total Bilirubin: 0.3 mg/dL (ref 0.3–1.2)
Total Protein: 7.6 g/dL (ref 6.5–8.1)

## 2022-04-27 LAB — TROPONIN I (HIGH SENSITIVITY)
Troponin I (High Sensitivity): 5 ng/L (ref <18)
Troponin I (High Sensitivity): 4 ng/L (ref <18)

## 2022-04-27 NOTE — ED Notes (Signed)
EKG found from triage and given to PA.

## 2022-04-27 NOTE — ED Provider Notes (Addendum)
[facility] EMERGENCY DEPARTMENT Provider Note   CSN: [id number] Arrival date & time: 04/27/22  1242     History  Chief Complaint  Patient presents with   Chest Pain    Rhonda Ayers is a 41 y.o. adult.  Patient, who is a transgender female, history of HIV, on estrogen --presents to the emergency department for evaluation of chest pain and tightness with shortness of breath and dizziness.  Symptoms started after smoking marijuana at around 10 AM.  This was "in a tube" and purchased from a tobacco shop.  She had never had this before.  It made her have tightness in her left chest with tingling in the arms, lightheadedness and dizziness.  No full syncope.  She had sweating and 1 episode of vomiting.  Dizziness still present.  No abdominal pain or diarrhea reported.  No other treatments prior to arrival.  She did smoke part of a cigarette afterwards, but denies smoking routinely.  No history of hypertension, high cholesterol or diabetes.  Patient denies risk factors for pulmonary embolism including: unilateral leg swelling, history of DVT/PE/other blood clots, recent immobilizations, recent surgery, recent travel (>4hr segment), malignancy, hemoptysis.          Home Medications Prior to Admission medications   Medication Sig Start Date End Date Taking? Authorizing Provider  bictegravir-emtricitabine-tenofovir AF (BIKTARVY) 50-200-25 MG TABS tablet Take 1 tablet by mouth daily. 04/02/22   Randall Hiss, MD  estradiol valerate (DELESTROGEN) 20 MG/ML injection Inject 1 mL (20 mg total) into the muscle every 21 ( twenty-one) days. And syringes 1 per 21 days 03/07/22   Carlus Pavlov, MD  gabapentin (NEURONTIN) 100 MG capsule Take 1 capsule (100 mg total) by mouth 2 (two) times daily for 5 days. 09/24/21 09/29/21  Guy Sandifer L, PA  spironolactone (ALDACTONE) 50 MG tablet Take 4 tablets (200 mg total) by mouth daily. 11/17/21   Romero Belling, MD       Allergies    Tramadol and Tylenol [acetaminophen]    Review of Systems   Review of Systems  Physical Exam Updated Vital Signs BP (!) 138/99 (BP Location: Right Arm)   Pulse 91   Temp 98.4 F (36.9 C) (Oral)   Resp 18   Ht 5\' 7"  (1.702 m)   Wt 104.3 kg   SpO2 97%   BMI 36.02 kg/m   Physical Exam Vitals and nursing note reviewed.  Constitutional:      Appearance: She is well-developed. She is not diaphoretic.  HENT:     Head: Normocephalic and atraumatic.     Mouth/Throat:     Mouth: Mucous membranes are not dry.  Eyes:     Conjunctiva/sclera: Conjunctivae normal.  Neck:     Vascular: Normal carotid pulses. No carotid bruit or JVD.     Trachea: Trachea normal. No tracheal deviation.  Cardiovascular:     Rate and Rhythm: Normal rate and regular rhythm.     Pulses: No decreased pulses.          Radial pulses are 2+ on the right side and 2+ on the left side.     Heart sounds: Normal heart sounds, S1 normal and S2 normal. Heart sounds not distant. No murmur heard. Pulmonary:     Effort: Pulmonary effort is normal. No respiratory distress.     Breath sounds: Normal breath sounds. No wheezing.  Chest:     Chest wall: No tenderness.  Abdominal:     General: Bowel sounds  are normal.     Palpations: Abdomen is soft.     Tenderness: There is no abdominal tenderness. There is no guarding or rebound.  Musculoskeletal:     Cervical back: Normal range of motion and neck supple. No muscular tenderness.     Right lower leg: No edema.     Left lower leg: No edema.  Skin:    General: Skin is warm and dry.     Coloration: Skin is not pale.  Neurological:     Mental Status: She is alert. Mental status is at baseline.  Psychiatric:        Mood and Affect: Mood normal.     ED Results / Procedures / Treatments   Labs (all labs ordered are listed, but only abnormal results are displayed) Labs Reviewed  COMPREHENSIVE METABOLIC PANEL - Abnormal; Notable for the following  components:      Result Value   Glucose, Bld 138 (*)    Creatinine, Ser 1.03 (*)    All other components within normal limits  CBC WITH DIFFERENTIAL/PLATELET - Abnormal; Notable for the following components:   Hemoglobin 15.1 (*)    All other components within normal limits  TROPONIN I (HIGH SENSITIVITY)  TROPONIN I (HIGH SENSITIVITY)    ED ECG REPORT   Date: 04/27/2022  Rate: 92  Rhythm: normal sinus rhythm  QRS Axis: normal  Intervals: normal  ST/T Wave abnormalities: normal  Conduction Disutrbances:none  Narrative Interpretation:   Old EKG Reviewed: unchanged   I have personally reviewed the EKG tracing and agree with the computerized printout as noted.    Radiology DG Chest 2 View  Result Date: 04/27/2022 CLINICAL DATA:  Chest pain into left arm, shortness of breath EXAM: CHEST - 2 VIEW COMPARISON:  Mar 12, 2021 FINDINGS: The heart size and mediastinal contours are within normal limits. Both lungs are clear and stable. The visualized skeletal structures are unremarkable. IMPRESSION: No active cardiopulmonary disease. Electronically Signed   By: Marjo Bicker M.D.   On: 04/27/2022 13:36    Procedures Procedures    Medications Ordered in ED Medications - No data to display  ED Course/ Medical Decision Making/ A&P    Patient seen and examined. History obtained directly from patient. Work-up including labs, imaging, EKG ordered in triage, if performed, were reviewed.    Labs/EKG: Independently reviewed and interpreted.  This included: CBC with normal white blood cell count, mildly increased hemoglobin at 15.1; CMP with mild hyperglycemia at 138, mildly elevated creatinine at 1.03 otherwise unremarkable; troponin 5 > 4.   Imaging: Independently visualized and interpreted.  This included: Chest x-ray, agree negative.  Medications/Fluids: None ordered  Most recent vital signs reviewed and are as follows: BP (!) 138/99 (BP Location: Right Arm)   Pulse 91   Temp 98.4  F (36.9 C) (Oral)   Resp 18   Ht 5\' 7"  (1.702 m)   Wt 104.3 kg   SpO2 97%   BMI 36.02 kg/m   Initial impression: Reaction to drug ingestion, no signs of cardiac involvement.  Low concern for PE given timeline and no clinical signs of DVT.   6:36 PM Patient became argumentative, yelling and cursing in the hallway, and was escorted out of the department prior to repeat EKG being performed.   6:44 PM Able to find EKG performed at 13:05 -- no ischemic findings, unchanged from previous.  Medical Decision Making Amount and/or Complexity of Data Reviewed ECG/medicine tests: ordered.   For this patient's complaint of chest pain, the following emergent conditions were considered on the differential diagnosis: acute coronary syndrome, pulmonary embolism, pneumothorax, myocarditis, pericardial tamponade, aortic dissection, thoracic aortic aneurysm complication, esophageal perforation.   Other causes were also considered including: gastroesophageal reflux disease, musculoskeletal pain including costochondritis, pneumonia/pleurisy, herpes zoster, pericarditis.  In regards to possibility of ACS, patient has atypical features of pain and negative troponin(s). EKG presumably done on arrival, but not able to see in Epic. Unable due to obtain repeat EKG 2/2 agitation. HEART score would be maximum of 3.   In regards to possibility of PE, symptoms are atypical for PE and risk profile is low, making PE low likelihood. Only risk estrogen use, but no clinical signs of DVT or vital sign abnormalities. I do not suspect DVT/PE as cause of symptoms.   CP and dizziness likely related to drug use, just prior to symptoms starting.   The patient's vital signs, pertinent lab work and imaging were reviewed and interpreted as discussed in the ED course. Hospitalization was considered for further testing, treatments, or serial exams/observation. However as patient is well-appearing, has a  stable exam, and reassuring studies today, I do not feel that they warrant admission at this time. This plan was discussed with the patient who verbalizes agreement and comfort with this plan and seems reliable and able to return to the Emergency Department with worsening or changing symptoms.          Final Clinical Impression(s) / ED Diagnoses Final diagnoses:  Precordial pain    Rx / DC Orders ED Discharge Orders     None         Renne Crigler, PA-C 04/27/22 1840    Renne Crigler, PA-C 04/27/22 1845    Cathren Laine, MD 04/27/22 1857

## 2022-04-27 NOTE — ED Triage Notes (Signed)
Pt states she bought some weed from a store and thinks it was laced with something because soon after smoking the week she started having chest pain, shortness of breath and nausea. This occurred approximately an hour ago.

## 2022-04-27 NOTE — ED Provider Triage Note (Signed)
Emergency Medicine Provider Triage Evaluation Note  Rhonda Ayers , a 41 y.o. adult  was evaluated in triage.  Pt complains of chest pain, shortness of breath and nausea.  She reports that she smoked weed from a store and started having these symptoms 15 minutes later.    She reports that her head hurts also.   She feels like her symptoms are worsening since they started about 1 hour PTA.   She reports that her left arm hurts also. She denies any other new intake.   She feels like it hurts when she breaths.   No known sick contacts.  No leg swelling.   No known cardiac history.    Physical Exam  BP (!) 138/99 (BP Location: Right Arm)   Pulse 91   Temp 98.4 F (36.9 C) (Oral)   Resp 18   Ht 5\' 7"  (1.702 m)   Wt 104.3 kg   SpO2 97%   BMI 36.02 kg/m  Gen:   Awake, no distress   Resp:  Normal effort  MSK:   Moves extremities without difficulty  Other:  RRR, no wheezing, lung sounds clear bilaterally.  No calf pain/TTP.  2+ pt pulses.   Medical Decision Making  Medically screening exam initiated at 1:09 PM.  Appropriate orders placed.  Arlina D All was informed that the remainder of the evaluation will be completed by another provider, this initial triage assessment does not replace that evaluation, and the importance of remaining in the ED until their evaluation is complete.     Mayford Knife, Cristina Gong 04/27/22 1316

## 2022-04-27 NOTE — ED Notes (Signed)
Patient began cussing and threatening staff due to not seeing a nurse x 1 hour.  Patient started screaming and throwing things and cussing at Charge RN reporting she was not doing her job.  Security called to bedside and patient escorted out of ER. Provider notified.

## 2022-04-27 NOTE — ED Notes (Signed)
Pt began yelling "nurse" in the hallway, this RN explained that we would get her nurse. Pt began yelling and cursing at staff. Pt stating this RN  was not doing her job, called this RN a "stupid bitch". RN explained that that language would not be tolerated, pt continued yelling and cursing loudly in the hallway. Security was called, pt stated she was leaving and was escorted out by security.

## 2022-05-29 ENCOUNTER — Other Ambulatory Visit: Payer: Self-pay

## 2022-05-29 DIAGNOSIS — B20 Human immunodeficiency virus [HIV] disease: Secondary | ICD-10-CM

## 2022-05-29 MED ORDER — BIKTARVY 50-200-25 MG PO TABS: 1.0000 | ORAL_TABLET | Freq: Every day | ORAL | 1 refills | Status: DC

## 2022-06-14 ENCOUNTER — Other Ambulatory Visit: Payer: Self-pay

## 2022-06-19 ENCOUNTER — Other Ambulatory Visit: Payer: Medicaid Other

## 2022-06-26 ENCOUNTER — Ambulatory Visit: Payer: Medicaid Other | Admitting: Endocrinology

## 2022-07-12 ENCOUNTER — Ambulatory Visit (HOSPITAL_COMMUNITY)
Admission: EM | Admit: 2022-07-12 | Discharge: 2022-07-12 | Disposition: A | Discharge status: Home / Self Care | Payer: Medicaid Other | Attending: Family Medicine | Admitting: Family Medicine

## 2022-07-12 ENCOUNTER — Encounter (HOSPITAL_COMMUNITY): Payer: Self-pay

## 2022-07-12 DIAGNOSIS — R21 Rash and other nonspecific skin eruption: Secondary | ICD-10-CM

## 2022-07-12 DIAGNOSIS — L739 Follicular disorder, unspecified: Secondary | ICD-10-CM

## 2022-07-12 MED ORDER — DOXYCYCLINE HYCLATE 100 MG PO CAPS: 100.0000 mg | ORAL_CAPSULE | Freq: Two times a day (BID) | ORAL | 0 refills | Status: AC

## 2022-07-12 MED ORDER — PREDNISONE 10 MG (21) PO TBPK: ORAL_TABLET | Freq: Every day | ORAL | 0 refills | Status: DC

## 2022-07-12 NOTE — ED Provider Notes (Signed)
[place]    CSN: [id number] Arrival date & time: 07/12/22  1239      History   Chief Complaint Chief Complaint  Patient presents with   Rash    HPI Rhonda Ayers is a 41 y.o. adult.   HPI 41 year old female presents with rash under left arm and left ear drainage for 3 days.  Reports rash is burning and itching.  PMH significant for HIV, obesity, and transgendered.  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 Authorizing Provider  doxycycline (VIBRAMYCIN) 100 MG capsule Take 1 capsule (100 mg total) by mouth 2 (two) times daily for 10 days. 07/12/22 07/22/22 Yes Eliezer Lofts, FNP  predniSONE (STERAPRED UNI-PAK 21 TAB) 10 MG (21) TBPK tablet Take by mouth daily. Take 6 tabs by mouth daily  for 2 days, then 5 tabs for 2 days, then 4 tabs for 2 days, then 3 tabs for 2 days, 2 tabs for 2 days, then 1 tab by mouth daily for 2 days 07/12/22  Yes Eliezer Lofts, FNP  bictegravir-emtricitabine-tenofovir AF (BIKTARVY) 50-200-25 MG TABS tablet Take 1 tablet by  mouth daily. 05/29/22   [place] Callas, NP  estradiol valerate (DELESTROGEN) 20 MG/ML injection Inject 1 mL (20 mg total) into the muscle every 21 ( twenty-one) days. And syringes 1 per 21 days 03/07/22   Philemon Kingdom, MD  gabapentin (NEURONTIN) 100 MG capsule Take 1 capsule (100 mg total) by mouth 2 (two) times daily for 5 days. 09/24/21 09/29/21  Geryl Councilman L, PA  spironolactone (ALDACTONE) 50 MG tablet Take 4 tablets (200 mg total) by mouth daily. 11/17/21   Renato Shin, MD    Family History Family History  Problem Relation Age of Onset   Diabetes Mother     Social History Social History   Tobacco Use   Smoking status: Light Smoker    Packs/day: 0.10    Types: Cigarettes   Smokeless tobacco: Never   Tobacco comments:    "2 cigarettes a day"   Vaping Use   Vaping Use: Never used  Substance Use Topics   Alcohol use: Not Currently    Alcohol/week: 0.0 standard drinks of alcohol    Comment: occasional   Drug use: Yes    Types: Marijuana    Comment: has stopped marijuana afer smoking some that was laced     Allergies   Tramadol and Tylenol [acetaminophen]   Review of Systems Review of Systems  HENT:  Positive for ear discharge.  Skin:  Positive for rash.     Physical Exam Triage Vital Signs ED Triage Vitals  Enc Vitals Group     BP 07/12/22 1409 (!) 133/96     Pulse Rate 07/12/22 1409 76     Resp 07/12/22 1409 12     Temp 07/12/22 1409 98.2 F (36.8 C)     Temp Source 07/12/22 1409 Oral     SpO2 07/12/22 1409 100 %     Weight 07/12/22 1408 230 lb (104.3 kg)     Height 07/12/22 1408 5\' 7"  (1.702 m)     Head Circumference --      Peak Flow --      Pain Score 07/12/22 1408 10     Pain Loc --      Pain Edu? --      Excl. in [place]? --    No data found.  Updated Vital Signs BP (!) 133/96 (BP Location: Left Arm)   Pulse 76   Temp 98.2 F (36.8 C) (Oral)   Resp 12   Ht 5\' 7"  (1.702 m)   Wt 230 lb (104.3 kg)   SpO2 100%   BMI 36.02 kg/m       Physical Exam Vitals and nursing note reviewed.  Constitutional:      Appearance: Normal appearance. She is obese.  HENT:     Head: Normocephalic and atraumatic.     Right Ear: Tympanic membrane, ear canal and external ear normal.     Left Ear: Tympanic membrane, ear canal and external ear normal.     Mouth/Throat:     Mouth: Mucous membranes are moist.     Pharynx: Oropharynx is clear.  Eyes:     Extraocular Movements: Extraocular movements intact.     Conjunctiva/sclera: Conjunctivae normal.     Pupils: Pupils are equal, round, and reactive to light.  Cardiovascular:     Rate and Rhythm: Normal rate and regular rhythm.     Pulses: Normal pulses.     Heart sounds: Normal heart sounds.  Pulmonary:     Effort: Pulmonary effort is normal.     Breath sounds: Normal breath sounds. No wheezing, rhonchi or rales.  Musculoskeletal:        General: Normal range of motion.     Cervical back: Normal range of motion and neck supple.  Skin:    General: Skin is warm and dry.     Comments: Axilla: Mildly erythematous pruritic macular papular eruption-please see image inserted below  Neurological:     General: No focal deficit present.     Mental Status: She is alert and oriented to person, place, and time. Mental status is at baseline.       UC Treatments / Results  Labs (all labs ordered are listed, but only abnormal results are displayed) Labs Reviewed - No data to display  EKG   Radiology No results found.  Procedures Procedures (including critical care time)  Medications Ordered in UC Medications - No data to display  Initial Impression / Assessment and Plan / UC Course  I have reviewed the triage vital signs and the nursing notes.  Pertinent labs & imaging results that were available during my care of the patient were reviewed by me and considered in my medical decision making (see chart for details).     MDM: 1.  Folliculitis-Rx'd doxycycline; 2.rash and  nonspecific skin eruption-Rx'd Sterapred Unipak. Advised patient to take medication as directed with food to completion.  Advised patient  to take prednisone with first dose of doxycycline for the next 10 days.  Encouraged patient to increase daily water intake while taking these medications.  Advised if symptoms worsen and/or unresolved please follow-up with PCP or here for further evaluation.  Patient discharged home, hemodynamically stable. Final Clinical Impressions(s) / UC Diagnoses   Final diagnoses:  Rash and nonspecific skin eruption  Folliculitis     Discharge Instructions      Advised patient to take medication as directed with food to completion.  Advised patient to take prednisone with first dose of doxycycline for the next 10 days.  Encouraged patient to increase daily water intake while taking these medications.  Advised if symptoms worsen and/or unresolved please follow-up with PCP or here for further evaluation.     ED Prescriptions     Medication Sig Dispense Auth. Provider   doxycycline (VIBRAMYCIN) 100 MG capsule Take 1 capsule (100 mg total) by mouth 2 (two) times daily for 10 days. 20 capsule Eliezer Lofts, FNP   predniSONE (STERAPRED UNI-PAK 21 TAB) 10 MG (21) TBPK tablet Take by mouth daily. Take 6 tabs by mouth daily  for 2 days, then 5 tabs for 2 days, then 4 tabs for 2 days, then 3 tabs for 2 days, 2 tabs for 2 days, then 1 tab by mouth daily for 2 days 42 tablet Eliezer Lofts, FNP      PDMP not reviewed this encounter.   Eliezer Lofts, [place] 07/12/22 1517

## 2022-07-12 NOTE — ED Triage Notes (Signed)
Pt is here for a rash under left arm,  and left ear with drainage , redness, burning and itching 3days

## 2022-07-12 NOTE — Discharge Instructions (Addendum)
Advised patient to take medication as directed with food to completion.  Advised patient to take prednisone with first dose of doxycycline for the next 10 days.  Encouraged patient to increase daily water intake while taking these medications.  Advised if symptoms worsen and/or unresolved please follow-up with PCP or here for further evaluation.

## 2022-07-27 ENCOUNTER — Other Ambulatory Visit: Payer: Self-pay | Admitting: Infectious Diseases

## 2022-07-27 ENCOUNTER — Ambulatory Visit: Payer: Medicaid Other

## 2022-07-27 ENCOUNTER — Encounter: Payer: Self-pay | Admitting: Internal Medicine

## 2022-07-27 ENCOUNTER — Other Ambulatory Visit: Payer: Medicaid Other

## 2022-07-27 ENCOUNTER — Other Ambulatory Visit: Payer: Self-pay

## 2022-07-27 ENCOUNTER — Other Ambulatory Visit (HOSPITAL_COMMUNITY): Payer: Self-pay

## 2022-07-27 ENCOUNTER — Ambulatory Visit (INDEPENDENT_AMBULATORY_CARE_PROVIDER_SITE_OTHER): Payer: Self-pay | Admitting: Internal Medicine

## 2022-07-27 VITALS — BP 124/82 | HR 86 | Ht 67.0 in | Wt 279.0 lb

## 2022-07-27 DIAGNOSIS — Z79899 Other long term (current) drug therapy: Secondary | ICD-10-CM

## 2022-07-27 DIAGNOSIS — B20 Human immunodeficiency virus [HIV] disease: Secondary | ICD-10-CM

## 2022-07-27 DIAGNOSIS — F640 Transsexualism: Secondary | ICD-10-CM

## 2022-07-27 DIAGNOSIS — Z113 Encounter for screening for infections with a predominantly sexual mode of transmission: Secondary | ICD-10-CM

## 2022-07-27 DIAGNOSIS — Z789 Other specified health status: Secondary | ICD-10-CM

## 2022-07-27 LAB — BASIC METABOLIC PANEL
BUN: 12 mg/dL (ref 6–23)
CO2: 25 mEq/L (ref 19–32)
Calcium: 10.1 mg/dL (ref 8.4–10.5)
Chloride: 99 mEq/L (ref 96–112)
Creatinine, Ser: 1.05 mg/dL (ref 0.40–1.20)
GFR: 66.97 mL/min (ref >60.00)
Glucose, Bld: 103 mg/dL — ABNORMAL HIGH (ref 70–99)
Potassium: 4.1 mEq/L (ref 3.5–5.1)
Sodium: 132 mEq/L — ABNORMAL LOW (ref 135–145)

## 2022-07-27 LAB — ESTRADIOL: Estradiol: 445 pg/mL — ABNORMAL HIGH

## 2022-07-27 LAB — PROLACTIN: Prolactin: 17.3 ng/mL

## 2022-07-27 LAB — TESTOSTERONE: Testosterone: 43.86 ng/dL — ABNORMAL HIGH (ref 15.00–40.00)

## 2022-07-27 NOTE — Progress Notes (Unsigned)
Name: Rhonda Ayers  MRN/ DOB: [id number], 09-19-1981    Age/ Sex: 41 y.o., adult     PCP: Daiva Eves, Lisette Grinder, MD   Reason for Endocrinology Evaluation: Gender Dysphoria      Initial Endocrinology Clinic Visit: 09/21/2021    PATIENT IDENTIFIER: [name] is a 41 y.o., adult with a past medical history of HIV, gender dysphoria. She has followed with [facility] since 09/21/2021 for consultative assistance with management of her gender dysphoria.   HISTORICAL SUMMARY: The patient was diagnosed with gender dysphoria and has been on gender affirming therapy since 2020.  Estrogen in 2022  No h/o DVT, dyslipidemia, liver disease, kidney disease, migraine, sleep apnea, heart disease, CVA, acne, diabetes, HTN, epilepsy, gallbladder disease, blood disorders, osteoporosis, or cancer.  SUBJECTIVE:    Today (07/27/2022):  [name] is here for a follow-up on gender dysphoria.  No Personal thromboembolic  events  FH of clotting disorder Denies acne  Facial hair is decreasing   Pt has noted with weight gain  Has noted increased breast tissue  No breast cancer in family  Denies nausea or diarrhea   Medications Estradiol valuate 20 mg (1 mL) monthly - pt takes 1.5 mL  Spironolactone 50 mg, 4 tabs daily     HISTORY:  Past Medical History:  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 She has been smoking an average of .1 packs per day. She has never used smokeless tobacco. She reports that she does not currently use alcohol. She reports current drug use. Drug: Marijuana. Family History:  Family History  Problem Relation Age of Onset   Diabetes Mother      HOME MEDICATIONS: Allergies as of 07/27/2022       Reactions   Tramadol Other (See Comments)   "shakes"   Tylenol [acetaminophen]         Medication List        Accurate as of July 27, 2022 11:32 AM. If you have any questions, ask your nurse or doctor.          STOP taking these medications    predniSONE 10 MG (21) Tbpk tablet Commonly known as: STERAPRED UNI-PAK 21 TAB Stopped by: Scarlette Shorts, MD       TAKE these medications    Biktarvy 50-200-25 MG Tabs tablet Generic drug: bictegravir-emtricitabine-tenofovir AF TAKE 1 TABLET BY MOUTH DAILY   estradiol valerate 20 MG/ML injection Commonly known as: DELESTROGEN Inject 1 mL (20 mg total) into the muscle every 21 ( twenty-one) days. And syringes 1 per 21 days   gabapentin 100 MG capsule Commonly known as: Neurontin Take 1 capsule (100 mg total) by mouth 2 (two) times daily for 5 days.   spironolactone 50 MG tablet Commonly known as: ALDACTONE Take 4 tablets (200 mg total) by mouth daily.  OBJECTIVE:   PHYSICAL EXAM: VS: BP 124/82 (BP Location: Left Arm, Patient Position: Sitting, Cuff Size: Large)   Pulse 86   Ht 5\' 7"  (1.702 m)   Wt 279 lb (126.6 kg)   SpO2 99%   BMI 43.70 kg/m    EXAM: General: Pt appears well and is in NAD  Eyes: External eye exam normal without stare, lid lag or exophthalmos.  EOM intact.    Neck: General: Supple without adenopathy. Thyroid: Thyroid size normal.  No goiter or nodules appreciated. No thyroid bruit.  Lungs: Clear with good BS bilat with no rales, rhonchi, or wheezes  Breast: Noted with PSUEDOgynecomastia  at this time   Heart: Auscultation:  RRR.  Abdomen: Normoactive bowel sounds, soft, nontender, without masses or organomegaly palpable  Extremities:  BL LE: No pretibial edema normal ROM and strength.  Mental Status: Judgment, insight: Intact Orientation: Oriented to time, place, and person Mood and affect: No depression, anxiety, or agitation     DATA REVIEWED:   Latest Reference Range & Units 07/27/22 11:55  Sodium 135 - 145 mEq/L 132 (L)  Potassium 3.5 - 5.1 mEq/L 4.1  Chloride 96 - 112 mEq/L 99  CO2 19 - 32 mEq/L 25  Glucose 70 - 99 mg/dL 103 (H)  BUN 6 - 23 mg/dL 12  Creatinine 0.40 - 1.20 mg/dL 1.05  Calcium 8.4 - 10.5 mg/dL 10.1  GFR >60.00 mL/min 66.97     Latest Reference Range & Units 07/27/22 11:55  Prolactin ng/mL 17.3  Glucose 70 - 99 mg/dL 103 (H)  Estradiol pg/mL 445 (H)  Testosterone 15.00 - 40.00 ng/dL 43.86 (H)     Old records , labs and images have been reviewed.   ASSESSMENT / PLAN / RECOMMENDATIONS:   Gender Dysphoria :   -Estrogen level continues to be above goal <200 pg/ml  -Testosterone level at goal -We discussed increased risk of thromboembolic events as well as breast cancer with estrogen intake -I will reduce estrogen level as below -Patient is questioning if she can preserve sperm for future use, unfortunately the patient has been on spironolactone with low testosterone for 3 years, I did explain to the patient that I am unable to say for certain but the patient will have to be of spironolactone and may need to follow-up with an infertility endocrinologist if this is needed in the future  Medications   Decrease estradiol valuate 20mg /mL to 20 mg (1 mL) monthly Continue spironolactone 50 mg 4 tabs daily  Follow-up in 6 months  Signed electronically by: Mack Guise, MD  [facility]  [address] [place], [place] [address] Phone: [phone] FAX: [phone]      CC: Tommy Medal, Lavell Islam, MD [address]  [place] [address] Phone: [phone]  Fax: [phone]   Return to Endocrinology clinic as below: Future Appointments  Date Time Provider [place]  07/30/2022  2:15 PM [place] Callas, NP [facility]

## 2022-07-27 NOTE — Addendum Note (Signed)
Addended by: Daisy Floro T on: 07/27/2022 10:44 AM   Modules accepted: Orders

## 2022-07-30 ENCOUNTER — Encounter: Payer: Self-pay | Admitting: Infectious Diseases

## 2022-07-30 ENCOUNTER — Other Ambulatory Visit: Payer: Self-pay

## 2022-07-30 ENCOUNTER — Ambulatory Visit: Payer: Self-pay

## 2022-07-30 ENCOUNTER — Ambulatory Visit: Payer: Medicaid Other

## 2022-07-30 ENCOUNTER — Ambulatory Visit (INDEPENDENT_AMBULATORY_CARE_PROVIDER_SITE_OTHER): Payer: Self-pay | Admitting: Infectious Diseases

## 2022-07-30 VITALS — BP 126/83 | HR 84 | Resp 16 | Ht 67.0 in | Wt 277.0 lb

## 2022-07-30 DIAGNOSIS — R635 Abnormal weight gain: Secondary | ICD-10-CM

## 2022-07-30 DIAGNOSIS — Z0000 Encounter for general adult medical examination without abnormal findings: Secondary | ICD-10-CM

## 2022-07-30 DIAGNOSIS — B20 Human immunodeficiency virus [HIV] disease: Secondary | ICD-10-CM

## 2022-07-30 MED ORDER — DOVATO 50-300 MG PO TABS
1.0000 | ORAL_TABLET | Freq: Every day | ORAL | 11 refills | Status: DC
Start: 2022-07-30 — End: 2022-08-09

## 2022-07-30 MED ORDER — SPIRONOLACTONE 50 MG PO TABS: 200.0000 mg | ORAL_TABLET | Freq: Every day | ORAL | 3 refills | Status: DC

## 2022-07-30 MED ORDER — ESTRADIOL VALERATE 20 MG/ML IM OIL: 20.0000 mg | TOPICAL_OIL | INTRAMUSCULAR | 5 refills | Status: DC

## 2022-07-30 MED ORDER — LUER LOCK SAFETY SYRINGES 22G X 1" 3 ML MISC
1.0000 | 3 refills | Status: DC
Start: 2022-07-30 — End: 2024-01-03

## 2022-07-30 NOTE — Patient Instructions (Addendum)
Walking wold be a great thing for you to start doing.   I would like to see if we can get you on Ozempic or Wegovy (weight loss and blood sugar regulation).   Will switch you to dovato one a day

## 2022-07-30 NOTE — Progress Notes (Unsigned)
Subjective:    Patient ID: Rhonda Ayers, adult    DOB: Feb 09, 1981, 41 y.o.   MRN: [id number]  Chief Complaint  Patient presents with   Follow-up    Declines flu shot-      HPI: Rhonda Ayers is a 41 y.o. adult with well controlled HIV on once daily Biktarvy with VL < 20 and CD4 550.   Worried about her weight gain pattern. She has a family history of diabetes and wants to make sure she does not get it. She has seen endocrinology for hormone management. Worried testosterone levels are too low. Reduced the dose of her estradiol injections recently. Has consultation with surgery team for top surgery. Following with laser hair treatments for facial hair and doing well with that.   Working at [employer] - tries to stay lower carb and eats a lot of salads. Does snack periodically on food at work but trying to avoid these moments. Starting exercise program now and hopeful this will help afford some weight loss.     Allergies  Allergen Reactions   Tramadol Other (See Comments)    "shakes"   Tylenol [Acetaminophen]      Outpatient Medications Prior to Visit  Medication Sig Dispense Refill   estradiol valerate (DELESTROGEN) 20 MG/ML injection Inject 1 mL (20 mg total) into the muscle every 28 (twenty-eight) days. And syringes 1 per 21 days 1 mL 5   gabapentin (NEURONTIN) 100 MG capsule Take 1 capsule (100 mg total) by mouth 2 (two) times daily for 5 days. 10 capsule 0   spironolactone (ALDACTONE) 50 MG tablet Take 4 tablets (200 mg total) by mouth daily. 360 tablet 3   SYRINGE-NEEDLE, DISP, 3 ML (LUER LOCK SAFETY SYRINGES) 22G X 1" 3 ML MISC 1 Device by Does not apply route every 30 (thirty) days. 10 each 3   BIKTARVY 50-200-25 MG TABS tablet TAKE 1 TABLET BY MOUTH DAILY 30 tablet 0   No facility-administered medications prior to visit.     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  HENT:  Negative for sore throat.   Eyes:  Negative for visual disturbance.  Gastrointestinal:  Negative for abdominal pain, anal bleeding and rectal pain.  Genitourinary:  Negative for dysuria, genital sores, penile discharge, penile pain, scrotal swelling and testicular pain.       Anogenital moles   Musculoskeletal:  Negative for arthralgias and joint swelling.  Skin:  Negative for rash.  Neurological:  Negative for headaches.  Hematological:  Negative for adenopathy.        Objective:    BP 126/83   Pulse 84   Resp 16   Ht 5\' 7"  (1.702 m)   Wt 277 lb (125.6 kg)   SpO2 97%   BMI 43.38 kg/m  Nursing note and vital signs reviewed.   Physical  Exam Constitutional:      Appearance: Normal appearance. She is not ill-appearing.  HENT:     Head: Normocephalic.     Mouth/Throat:     Mouth: Mucous membranes are moist.     Pharynx: Oropharynx is clear.  Eyes:     General: No scleral icterus. Pulmonary:     Effort: Pulmonary effort is normal.  Musculoskeletal:        General: Normal range of motion.     Cervical back: Normal range of motion.  Skin:    Coloration: Skin is not jaundiced or pale.  Neurological:     Mental Status: She is alert and oriented to person, place, and time.  Psychiatric:        Mood and Affect: Mood normal.        Judgment: Judgment normal.         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 Will switch to Dovato to see if this helps with more weight favorable option for treatment; though it's likely multifactorial. Prediabetic in the past but will re-screen today given significant weight gain. No concerns with access or adherence to medication. They are tolerating the medication well without side effects. No drug interactions identified. Pertinent lab tests ordered today.  Reminded about ADAP re-enrollment in July / January.  No dental needs today.  No concern over anxious/depressed mood.  Sexual health and family planning discussed - no needs today.  Vaccines updated today - see health maintenance section.    Return in about 2 months (around 09/29/2022).        Relevant Medications   dolutegravir-lamiVUDine (DOVATO) 50-300 MG tablet   Health care maintenance    Declined flu shot Will re-screen for hepatitis b and immunity given dovato switch.  A1c for diabetes screen today.       Weight gain    Plan switch from biktarvy to dovato Completely support an exercise program to start with walking. Likely multifactorial - explained that estrogen could also be contributing. She is very interested in using injectable option weekly for glucose control and weight management. Hopefully she will be eligible for medicaid expansion to help gain access to these medications.   Will return in 2 months       Other Visit Diagnoses     Human immunodeficiency virus (HIV) disease ([place])    -  Primary   Relevant Medications   dolutegravir-lamiVUDine (DOVATO) 50-300 MG tablet   Other Relevant Orders   Hemoglobin A1c  (Completed)   HIV 1 RNA quant-no reflex-bld   RPR   T-helper cells (CD4) count (Completed)   Hepatitis B surface antigen   Hepatitis B surface antibody,qualitative      Janene Madeira, MSN, NP-C [facility] for Infectious Disease [email or web address] Pager: [phone] Office: [phone] [place]: [phone]

## 2022-07-31 ENCOUNTER — Telehealth: Payer: Self-pay

## 2022-07-31 DIAGNOSIS — R635 Abnormal weight gain: Secondary | ICD-10-CM

## 2022-07-31 HISTORY — DX: Abnormal weight gain: R63.5

## 2022-07-31 LAB — T-HELPER CELLS (CD4) COUNT (NOT AT ARMC)
CD4 % Helper T Cell: 36 % (ref 33–65)
CD4 T Cell Abs: 697 /uL (ref 400–1790)

## 2022-07-31 NOTE — Assessment & Plan Note (Signed)
Plan switch from biktarvy to dovato Completely support an exercise program to start with walking. Likely multifactorial - explained that estrogen could also be contributing. She is very interested in using injectable option weekly for glucose control and weight management. Hopefully she will be eligible for medicaid expansion to help gain access to these medications.   Will return in 2 months

## 2022-07-31 NOTE — Progress Notes (Signed)
Newly diagnosed diabetes. Will reach out to refer to [facility] for new patient appt for management and preventative care. She was most interested in the injectable options for management given that won't impact her pill burden any more.  Will also encourage her to reach out to her endocrinology team about another appointment to discuss this, her concern over weight gain (possibly d/t hormones) and see what is best for treatment.

## 2022-07-31 NOTE — Telephone Encounter (Signed)
Patient called and requested to discuss recent A1c test result with provider. Wants to confirm whether the result is within the pre-diabetic range; concerned about recent weight gain with taking hormones.  Routed to provider.  Binnie Kand, RN

## 2022-07-31 NOTE — Assessment & Plan Note (Signed)
Declined flu shot Will re-screen for hepatitis b and immunity given dovato switch.  A1c for diabetes screen today.

## 2022-07-31 NOTE — Assessment & Plan Note (Signed)
Very well controlled on once daily Biktarvy. Will switch to Dovato to see if this helps with more weight favorable option for treatment; though it's likely multifactorial. Prediabetic in the past but will re-screen today given significant weight gain. No concerns with access or adherence to medication. They are tolerating the medication well without side effects. No drug interactions identified. Pertinent lab tests ordered today.  Reminded about ADAP re-enrollment in July / January.  No dental needs today.  No concern over anxious/depressed mood.  Sexual health and family planning discussed - no needs today.  Vaccines updated today - see health maintenance section.    Return in about 2 months (around 09/29/2022).

## 2022-07-31 NOTE — Telephone Encounter (Signed)
She unfortunately has progressed into the early stages of diabetes with an A1C 6.5%. The best person to help her with this will be her endocrinologist actually. I think the weekly injections would be helpful for her both for blood sugar regulation and weight loss. I would recommend she reach to endocrinology out to make another appointment to discuss this, or we can help her to establish care with the [facility] on the 3rd floor as well since she needs the assistance of preventative care as well. They will be a much better resource than me to help her with this long term.   I am happy to send her some information on mychart about helpful nutrition / diet resources to help get this under good control.   It is very possible with weight loss and changes in nutrition she can reverse this!

## 2022-08-01 ENCOUNTER — Ambulatory Visit: Payer: Self-pay | Admitting: Infectious Diseases

## 2022-08-01 LAB — HEMOGLOBIN A1C
Hgb A1c MFr Bld: 6.5 % of total Hgb — ABNORMAL HIGH (ref <5.7)
Mean Plasma Glucose: 140 mg/dL
eAG (mmol/L): 7.7 mmol/L

## 2022-08-01 LAB — HEPATITIS B SURFACE ANTIBODY,QUALITATIVE: Hep B S Ab: NONREACTIVE

## 2022-08-01 LAB — RPR TITER: RPR Titer: 1:8 {titer} — ABNORMAL HIGH

## 2022-08-01 LAB — RPR: RPR Ser Ql: REACTIVE — AB

## 2022-08-01 LAB — HEPATITIS B SURFACE ANTIGEN: Hepatitis B Surface Ag: NONREACTIVE

## 2022-08-01 LAB — HIV-1 RNA QUANT-NO REFLEX-BLD
HIV 1 RNA Quant: 25 Copies/mL — ABNORMAL HIGH
HIV-1 RNA Quant, Log: 1.39 Log cps/mL — ABNORMAL HIGH

## 2022-08-01 LAB — FLUORESCENT TREPONEMAL AB(FTA)-IGG-BLD: Fluorescent Treponemal ABS: REACTIVE — AB

## 2022-08-02 ENCOUNTER — Other Ambulatory Visit: Payer: Self-pay | Admitting: Pharmacist

## 2022-08-02 DIAGNOSIS — B20 Human immunodeficiency virus [HIV] disease: Secondary | ICD-10-CM

## 2022-08-02 MED ORDER — BICTEGRAVIR-EMTRICITAB-TENOFOV 50-200-25 MG PO TABS: 1.0000 | ORAL_TABLET | Freq: Every day | ORAL | 0 refills | Status: AC

## 2022-08-02 NOTE — Progress Notes (Signed)
Medication Samples have been provided to the patient.  Drug name: Biktarvy        Strength: 50/200/25 mg       Qty: 7 tablets (1 bottles) LOT: [facility]WC   Exp.Date: 9/25  Dosing instructions: Take one tablet by mouth once daily  The patient has been instructed regarding the correct time, dose, and frequency of taking this medication, including desired effects and most common side effects.   Yadier Bramhall, PharmD, CPP, BCIDP, AAHIVP Clinical Pharmacist Practitioner Infectious Diseases Clinical Pharmacist [facility]  

## 2022-08-09 ENCOUNTER — Telehealth: Payer: Self-pay

## 2022-08-09 ENCOUNTER — Other Ambulatory Visit: Payer: Self-pay

## 2022-08-09 MED ORDER — DOVATO 50-300 MG PO TABS: 1.0000 | ORAL_TABLET | Freq: Every day | ORAL | 11 refills | Status: DC

## 2022-08-09 NOTE — Telephone Encounter (Signed)
Patient called office today stating she has not heard from [facility] about having medication mailed. Dovato was sent to [facility] on [address]. Patient is requesting prescription be sent to specialty pharmacy.  Rx resent. Provided patient with pharmacy number to follow up with. Leatrice Jewels, RMA

## 2022-08-30 ENCOUNTER — Other Ambulatory Visit (HOSPITAL_COMMUNITY): Payer: Self-pay

## 2022-09-21 ENCOUNTER — Ambulatory Visit: Payer: Medicaid Other | Admitting: Endocrinology

## 2022-09-24 ENCOUNTER — Ambulatory Visit (INDEPENDENT_AMBULATORY_CARE_PROVIDER_SITE_OTHER): Payer: Self-pay | Admitting: Infectious Diseases

## 2022-09-24 ENCOUNTER — Other Ambulatory Visit: Payer: Self-pay

## 2022-09-24 ENCOUNTER — Encounter: Payer: Self-pay | Admitting: Infectious Diseases

## 2022-09-24 VITALS — BP 136/85 | HR 74 | Resp 16 | Ht 67.0 in | Wt 260.1 lb

## 2022-09-24 DIAGNOSIS — E119 Type 2 diabetes mellitus without complications: Secondary | ICD-10-CM | POA: Insufficient documentation

## 2022-09-24 DIAGNOSIS — B20 Human immunodeficiency virus [HIV] disease: Secondary | ICD-10-CM

## 2022-09-24 DIAGNOSIS — Z7984 Long term (current) use of oral hypoglycemic drugs: Secondary | ICD-10-CM

## 2022-09-24 DIAGNOSIS — Z789 Other specified health status: Secondary | ICD-10-CM

## 2022-09-24 HISTORY — DX: Type 2 diabetes mellitus without complications: E11.9

## 2022-09-24 MED ORDER — METFORMIN HCL 1000 MG PO TABS: ORAL_TABLET | ORAL | 0 refills | Status: DC

## 2022-09-24 NOTE — Assessment & Plan Note (Signed)
Managed by endocrinology and recently decreased her injections due to persistently above target estradiol - Will repeat level in 2 months as requested to follow up change. She has an appt with endo in April 2024.  Her testosterone is in target range - discussed this today.

## 2022-09-24 NOTE — Assessment & Plan Note (Addendum)
Doing well with switching to Dovato once daily. Will repeat VL at next lab draw.  She has done very well on treatment with VL < 20 and CD4 < 600 in October.  Will discuss more re: recommended vaccines at next OV for preventative care.

## 2022-09-24 NOTE — Patient Instructions (Addendum)
Will plan to start a new medication called Metformin - take one pill once a day with a meal for a week. Then if your stomach side effects are not too bad can increase it to one pill twice a day. Take with food to help with stomach side effects.   For primary care services, please call one of the following clinics for a new patient appointment:  [facility] - [address] [place].  [phone]   Will repeat your liver function tests in 2 months or so to follow up on your new medication and check your liver function tests  I would say to prioritize more vegetables than fruits over the day. Add in more protein (meats you like, eggs, cottage cheese, low sugar yogurt like TwoGood brand).  Make sure you get enough healthy fats too (avocado, nuts, meat. AVOID things with too much soybean oil, canola oil - they are all fake and not natural).    Diabetes Mellitus and Nutrition, Adult When you have diabetes, or diabetes mellitus, it is very important to have healthy eating habits because your blood sugar (glucose) levels are greatly affected by what you eat and drink. Eating healthy foods in the right amounts, at about the same times every day, can help you: Manage your blood glucose. Lower your risk of heart disease. Improve your blood pressure. Reach or maintain a healthy weight. What can affect my meal plan? Every person with diabetes is different, and each person has different needs for a meal plan. Your health care provider may recommend that you work with a dietitian to make a meal plan that is best for you. Your meal plan may vary depending on factors such as: The calories you need. The medicines you take. Your weight. Your blood glucose, blood pressure, and cholesterol levels. Your activity level. Other health conditions you have, such as heart or kidney disease. How do carbohydrates affect me? Carbohydrates, also called carbs, affect your blood glucose level more than any  other type of food. Eating carbs raises the amount of glucose in your blood. It is important to know how many carbs you can safely have in each meal. This is different for every person. Your dietitian can help you calculate how many carbs you should have at each meal and for each snack. How does alcohol affect me? Alcohol can cause a decrease in blood glucose (hypoglycemia), especially if you use insulin or take certain diabetes medicines by mouth. Hypoglycemia can be a life-threatening condition. Symptoms of hypoglycemia, such as sleepiness, dizziness, and confusion, are similar to symptoms of having too much alcohol. Do not drink alcohol if: Your health care provider tells you not to drink. You are pregnant, may be pregnant, or are planning to become pregnant. If you drink alcohol: Limit how much you have to: 0-1 drink a day for women. 0-2 drinks a day for men. Know how much alcohol is in your drink. In the [place] one drink equals one 12 oz bottle of beer (355 mL), one 5 oz glass of wine (148 mL), or one 1 oz glass of hard liquor (44 mL). Keep yourself hydrated with water, diet soda, or unsweetened iced tea. Keep in mind that regular soda, juice, and other mixers may contain a lot of sugar and must be counted as carbs. What are tips for following this plan?  Reading food labels Start by checking the serving size on the Nutrition Facts label of packaged foods and drinks. The number of calories  and the amount of carbs, fats, and other nutrients listed on the label are based on one serving of the item. Many items contain more than one serving per package. Check the total grams (g) of carbs in one serving. Check the number of grams of saturated fats and trans fats in one serving. Choose foods that have a low amount or none of these fats. Check the number of milligrams (mg) of salt (sodium) in one serving. Most people should limit total sodium intake to less than 2,300 mg per day. Always check the  nutrition information of foods labeled as "low-fat" or "nonfat." These foods may be higher in added sugar or refined carbs and should be avoided. Talk to your dietitian to identify your daily goals for nutrients listed on the label. Shopping Avoid buying canned, pre-made, or processed foods. These foods tend to be high in fat, sodium, and added sugar. Shop around the outside edge of the grocery store. This is where you will most often find fresh fruits and vegetables, bulk grains, fresh meats, and fresh dairy products. Cooking Use low-heat cooking methods, such as baking, instead of high-heat cooking methods, such as deep frying. Cook using healthy oils, such as olive, canola, or sunflower oil. Avoid cooking with butter, cream, or high-fat meats. Meal planning Eat meals and snacks regularly, preferably at the same times every day. Avoid going long periods of time without eating. Eat foods that are high in fiber, such as fresh fruits, vegetables, beans, and whole grains. Eat 4-6 oz (112-168 g) of lean protein each day, such as lean meat, chicken, fish, eggs, or tofu. One ounce (oz) (28 g) of lean protein is equal to: 1 oz (28 g) of meat, chicken, or fish. 1 egg.  cup (62 g) of tofu. Eat some foods each day that contain healthy fats, such as avocado, nuts, seeds, and fish. What foods should I eat? Fruits Berries. Apples. Peaches. Apricots. Plums. . Mangoes. Papayas. Pomegranates. Kiwi. Cherries.  Vegetables Leafy greens, including lettuce, spinach, kale, chard, collard greens, mustard greens, and cabbage. Beets. Cauliflower. Broccoli. Carrots. Green beans. Tomatoes. Peppers. Onions. Cucumbers. Brussels sprouts.   Grains Whole grains, such as whole-wheat or whole-grain bread, crackers, tortillas, cereal, and pasta. Unsweetened oatmeal. Quinoa. Brown or wild rice. Meats and other proteins Seafood. Poultry without skin. Lean cuts of poultry and beef. Tofu. Nuts. Seeds. Dairy Low-fat or  fat-free dairy products such as milk, yogurt, and cheese. The items listed above may not be a complete list of foods and beverages you can eat and drink. Contact a dietitian for more information. What foods should I avoid? Fruits Fruits canned with syrup. Vegetables Canned vegetables. Frozen vegetables with butter or cream sauce. Grains Refined white flour and flour products such as bread, pasta, snack foods, and cereals. Avoid all processed foods. Meats and other proteins Fatty cuts of meat. Poultry with skin. Breaded or fried meats. Processed meat. Avoid saturated fats. Dairy Full-fat yogurt, cheese, or milk. Beverages Sweetened drinks, such as soda or iced tea. The items listed above may not be a complete list of foods and beverages you should avoid. Contact a dietitian for more information. Questions to ask a health care provider Do I need to meet with a certified diabetes care and education specialist? Do I need to meet with a dietitian? What number can I call if I have questions? When are the best times to check my blood glucose? Where to find more information: American Diabetes Association: diabetes.org Academy of Nutrition and  Dietetics: eatright.[employer] of Diabetes and Digestive and Kidney Diseases: StageSync.si Association of Diabetes Care & Education Specialists: diabeteseducator.org Summary It is important to have healthy eating habits because your blood sugar (glucose) levels are greatly affected by what you eat and drink. It is important to use alcohol carefully. A healthy meal plan will help you manage your blood glucose and lower your risk of heart disease. Your health care provider may recommend that you work with a dietitian to make a meal plan that is best for you. This information is not intended to replace advice given to you by your health care provider. Make sure you discuss any questions you have with your health care provider. Document Revised:  05/04/2020 Document Reviewed: 05/04/2020 Elsevier Patient Education  2023 [employer].

## 2022-09-24 NOTE — Progress Notes (Signed)
Subjective:    Patient ID: Rhonda Ayers, adult    DOB: 23-Apr-1981, 41 y.o.   MRN: [id number]  Chief Complaint  Patient presents with   Follow-up    Lost weight was 277 now 261 working out several times a week.       HPI: Special is a 41 y.o. adult with well controlled HIV on once daily Biktarvy with VL < 20 and CD4 550.   She is here for 66-month follow-up after lab work returned concerning for new onset type 2 diabetes.  Since that time she has really focused efforts in changing her nutrition and has introduced consistent exercise 4 days a week.  In the interval since her last office visit she has lost almost 20 pounds and is very pleased with her results.  She does feel better.  She focuses a lot on prioritizing fruits and some vegetables.  Has 2 meals a day with occasional third.  Brief recall reveals oatmeal with fruit in the morning.  And then she fasts until the evening where she has a solid with vegetables and dressing.  She is not a huge fan of meat and stays away from steak.  She is open to eating more chicken and fish or [place].  She does not completely feel satisfied in full but has been working hard to modify her intake.  She drinks water and limits other fluids.  She has cut down to only 3 cigarettes a day.  She is asking for resources for complete physical and a team to help focus on preventative health care.   Allergies  Allergen Reactions   Tramadol Other (See Comments)    "shakes"   Tylenol [Acetaminophen]      Outpatient Medications Prior to Visit  Medication Sig Dispense Refill   dolutegravir-lamiVUDine (DOVATO) 50-300 MG tablet Take 1 tablet by mouth daily. 30 tablet 11   estradiol valerate (DELESTROGEN) 20 MG/ML injection Inject 1 mL (20 mg total) into the muscle every 28 (twenty-eight) days. And syringes 1 per 21 days 1 mL 5   gabapentin (NEURONTIN) 100 MG capsule Take 1 capsule (100 mg total) by mouth 2 (two) times daily for 5 days. 10 capsule 0    spironolactone (ALDACTONE) 50 MG tablet Take 4 tablets (200 mg total) by mouth daily. 360 tablet 3   SYRINGE-NEEDLE, DISP, 3 ML (LUER LOCK SAFETY SYRINGES) 22G X 1" 3 ML MISC 1 Device by Does not apply route every 30 (thirty) days. 10 each 3   No facility-administered medications prior to visit.     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  Eyes:  Negative for visual disturbance.  Respiratory:  Negative for cough and shortness of breath.  Cardiovascular:  Negative for chest pain and leg swelling.  Gastrointestinal:  Negative for abdominal pain, diarrhea and nausea.  Genitourinary:  Negative for dysuria, genital sores and penile discharge.  Musculoskeletal:  Negative for joint swelling.  Skin:  Negative for color change and rash.  Neurological:  Negative for dizziness and headaches.  Hematological:  Negative for adenopathy.  Psychiatric/Behavioral:  Negative for sleep disturbance. The patient is not nervous/anxious.         Objective:    BP 136/85   Pulse 74   Resp 16   Ht 5\' 7"  (1.702 m)   Wt 260 lb 1.6 oz (118 kg)   SpO2 97%   BMI 40.74 kg/m  Nursing note and vital signs reviewed.   Physical Exam Constitutional:      Appearance: Normal  appearance. She is not ill-appearing.  HENT:     Head: Normocephalic.     Mouth/Throat:     Mouth: Mucous membranes are moist.     Pharynx: Oropharynx is clear.  Eyes:     General: No scleral icterus. Pulmonary:     Effort: Pulmonary effort is normal.  Musculoskeletal:        General: Normal range of motion.     Cervical back: Normal range of motion.  Skin:    Coloration: Skin is not jaundiced or pale.  Neurological:     Mental Status: She is alert and oriented to person, place, and time.  Psychiatric:        Mood and Affect: Mood normal.        Judgment: Judgment normal.         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 Will repeat VL at next lab draw.  She has done very well on treatment with VL < 20 and CD4 < 600 in October.  Will discuss more re: recommended vaccines at next OV for preventative care.       Relevant Medications   metFORMIN (GLUCOPHAGE) 1000 MG tablet   Other Relevant Orders   Hemoglobin A1c   COMPLETE METABOLIC PANEL WITH GFR   Lipid panel   HIV 1 RNA quant-no reflex-bld   Female-to-female transgender person    Managed by endocrinology and recently decreased her injections due to persistently above target estradiol - Will repeat level in 2 months as requested to follow up change. She has an appt with endo in April 2024.  Her testosterone is in target range - discussed this today.       Relevant Orders   Estradiol   Type 2  diabetes mellitus without complication, without long-term current use of insulin (HCC) - Primary    Special and I spent a majority of our time discussing diagnosis of type 2 diabetes.  She does have a strong family history of it along with heart disease. I am very proud of her with her weight loss of 20 pounds in the last 2 months of life I  encouraged her to continue exercise and nutritional optimization to help with glycemic control.  We discussed increasing vegetable intake and keeping fruits limited to 1 - 2 times a day and focusing on lower sugar options like berries, grapefruit, apples.  We discussed the benefit of starting metformin for her today to help with glycemic control.  Will start 1000 mg once daily for a week and depending on how she tolerates this can increase to twice daily from there.  We discussed that she would also likely have a great benefit to meet and discussed with community health and wellness team for further management.  I think she would be a good candidate to consider some of the GLP/GIP injectable options to help with weight reduction.  These have also been excellent from what I understand regarding cardiac risk modification.  I gave her the number to call today and encouraged her to set up a new patient appointment.  I do also think she would benefit from checking her blood sugar regularly to help with diabetes education.  I will have her back in 2 months to recheck her organ function and A1c to see how she is doing on treatment.      Relevant Medications   metFORMIN (GLUCOPHAGE) 1000 MG tablet   Other Relevant Orders   Hemoglobin A1c   COMPLETE METABOLIC PANEL WITH GFR   Lipid panel   Total encounter time: 30 min reviewing the above.   Rexene Alberts, MSN, NP-C [facility]  [email or web address] Pager: [phone] Office: [phone] [facility] Main Line: [phone]

## 2022-09-24 NOTE — Assessment & Plan Note (Addendum)
Special and I spent a majority of our time discussing diagnosis of type 2 diabetes.  She does have a strong family history of it along with heart disease. I am very proud of her with her weight loss of 20 pounds in the last 2 months of life I encouraged her to continue exercise and nutritional optimization to help with glycemic control.  We discussed increasing vegetable intake and keeping fruits limited to 1 - 2 times a day and focusing on lower sugar options like berries, grapefruit, apples.  We discussed the benefit of starting metformin for her today to help with glycemic control.  Will start 1000 mg once daily for a week and depending on how she tolerates this can increase to twice daily from there.  We discussed that she would also likely have a great benefit to meet and discussed with community health and wellness team for further management.  I think she would be a good candidate to consider some of the GLP/GIP injectable options to help with weight reduction.  These have also been excellent from what I understand regarding cardiac risk modification.  I gave her the number to call today and encouraged her to set up a new patient appointment.  I do also think she would benefit from checking her blood sugar regularly to help with diabetes education.  I will have her back in 2 months to recheck her organ function and A1c to see how she is doing on treatment.

## 2022-10-09 ENCOUNTER — Emergency Department (HOSPITAL_COMMUNITY)
Admission: EM | Admit: 2022-10-09 | Discharge: 2022-10-09 | Discharge status: Absent without leave | Payer: Medicaid Other

## 2022-10-09 NOTE — ED Notes (Signed)
Called pt x5 for triage, no response.

## 2022-10-16 ENCOUNTER — Ambulatory Visit (HOSPITAL_COMMUNITY)
Admission: EM | Admit: 2022-10-16 | Discharge: 2022-10-16 | Disposition: A | Discharge status: Home / Self Care | Payer: Medicaid Other

## 2022-10-16 ENCOUNTER — Other Ambulatory Visit: Payer: Self-pay | Admitting: Infectious Diseases

## 2022-10-16 ENCOUNTER — Encounter (HOSPITAL_COMMUNITY): Payer: Self-pay | Admitting: *Deleted

## 2022-10-16 DIAGNOSIS — B20 Human immunodeficiency virus [HIV] disease: Secondary | ICD-10-CM

## 2022-10-16 DIAGNOSIS — L819 Disorder of pigmentation, unspecified: Secondary | ICD-10-CM

## 2022-10-16 DIAGNOSIS — R21 Rash and other nonspecific skin eruption: Secondary | ICD-10-CM

## 2022-10-16 DIAGNOSIS — E119 Type 2 diabetes mellitus without complications: Secondary | ICD-10-CM

## 2022-10-16 NOTE — ED Provider Notes (Signed)
[facility]    CSN: [id number] Arrival date & time: 10/16/22  1442      History   Chief Complaint Chief Complaint  Patient presents with   Rash    HPI Rhonda Ayers is a 42 y.o. adult.   Patient presents urgent care for evaluation of rash to the left underarm that started initially in September 2023, then improved after antibiotics, then returned 2 days ago.  She was seen in urgent care in September 2023 and treated for folliculitis to the left axilla (per chart review).  She took all of the medications as prescribed and symptoms improved significantly.  She is now reporting area of darkened skin that is itchy and slightly tender.  Denies recurrence of rash, drainage, recent trauma/injury to the area, and fever/chills.  She has been using cortisone cream with aloe as well as alcohol to the area and attempt to help without relief.  She has not been to see dermatology regarding this yet.  She takes estrogen therapy and spironolactone androgen blocker but it was recommended that she stop taking this after initial axillary infection.  She has not started this back again due to concern that it may be contributing to rash.  She used to use men's degree deodorant but now uses women's degree deodorant.  This was not a recent change.  No other changes in personal hygiene products or exposure to known irritants/allergens.     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   Type 2 diabetes mellitus without complication, without long-term current use of insulin (HCC) 09/24/2022   Work related injury 09/01/2018     Patient Active Problem List   Diagnosis Date Noted   Type 2 diabetes mellitus without complication, without long-term current use of insulin (HCC) 09/24/2022   Weight gain 07/31/2022   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 Authorizing Provider  dolutegravir-lamiVUDine (DOVATO) 50-300 MG tablet Take 1 tablet by mouth daily. 08/09/22  Yes Blanchard Kelch, NP  estradiol valerate (DELESTROGEN) 20 MG/ML injection Inject 1 mL (20 mg total) into the muscle every 28 (twenty-eight) days. And syringes 1 per 21 days 07/30/22  Yes Shamleffer, Konrad Dolores, MD  spironolactone (ALDACTONE) 50 MG tablet Take 4 tablets (200 mg total) by mouth daily. 07/30/22  Yes Shamleffer, Konrad Dolores, MD  gabapentin (NEURONTIN) 100 MG capsule Take 1 capsule (100 mg total) by mouth 2 (two) times daily for 5 days. 09/24/21 07/30/22  Crain, Whitney L, PA  metFORMIN (GLUCOPHAGE) 1000 MG tablet TAKE 1 TABLET(1000 MG) BY MOUTH DAILY WITH BREAKFAST FOR 7 DAYS THEN TAKE 1 TABLET(1000 MG) BY MOUTH TWICE DAILY WITH A MEAL FOR 28 DAYS  10/16/22   [name], Melton Krebs, NP  SYRINGE-NEEDLE, DISP, 3 ML (LUER LOCK SAFETY SYRINGES) 22G X 1" 3 ML MISC 1 Device by Does not apply route every 30 (thirty) days. 07/30/22   Shamleffer, Melanie Crazier, MD    Family History Family History  Problem Relation Age of Onset   Diabetes Mother     Social History Social History   Tobacco Use   Smoking status: Light Smoker    Packs/day: 0.10    Types: Cigarettes   Smokeless tobacco: Never   Tobacco comments:    "2 cigarettes a day"   Vaping  Use   Vaping Use: Never used  Substance Use Topics   Alcohol use: Not Currently   Drug use: Not Currently    Types: Marijuana     Allergies   Tramadol and Tylenol [acetaminophen]   Review of Systems Review of Systems Per HPI  Physical Exam Triage Vital Signs ED Triage Vitals  Enc Vitals Group     BP 10/16/22 1722 133/73     Pulse Rate 10/16/22 1722 70     Resp 10/16/22 1722 16     Temp 10/16/22 1722 98.2 F (36.8 C)     Temp Source 10/16/22 1722 Oral     SpO2 10/16/22 1722 96 %     Weight --      Height --      Head Circumference --      Peak Flow --      Pain Score 10/16/22 1723 0     Pain Loc --      Pain Edu? --      Excl. in [place]? --    No data found.  Updated Vital Signs BP 133/73   Pulse 70   Temp 98.2 F (36.8 C) (Oral)   Resp 16   SpO2 96%   Visual Acuity Right Eye Distance:   Left Eye Distance:   Bilateral Distance:    Right Eye Near:   Left Eye Near:    Bilateral Near:     Physical Exam Vitals and nursing note reviewed.  Constitutional:      Appearance: She is not ill-appearing or toxic-appearing.  HENT:     Head: Normocephalic and atraumatic.     Right Ear: Hearing and external ear normal.     Left Ear: Hearing and external ear normal.     Nose: Nose normal.     Mouth/Throat:     Lips: Pink.  Eyes:     General: Lids are normal. Vision grossly intact. Gaze aligned appropriately.     Extraocular Movements: Extraocular movements intact.     Conjunctiva/sclera: Conjunctivae normal.  Cardiovascular:     Rate and Rhythm: Normal rate and regular rhythm.     Heart sounds: Normal heart sounds, S1 normal and S2 normal.  Pulmonary:     Effort: Pulmonary effort is normal. No respiratory distress.     Breath sounds: Normal breath sounds and air entry.  Musculoskeletal:     Cervical back: Neck supple.  Skin:    General: Skin is warm and dry.     Capillary Refill: Capillary refill takes less than 2 seconds.     Findings: Rash present.      Comments: Area of hyperpigmentation to the left axillary region/outer left breast region as seen in image below.  No signs of excoriation related to pruritus.  No warmth, erythema, underlying soft tissue swelling, or abscess palpable.  No axillary lymph node swelling.  Neurological:  General: No focal deficit present.     Mental Status: She is alert and oriented to person, place, and time. Mental status is at baseline.     Cranial Nerves: No dysarthria or facial asymmetry.  Psychiatric:        Mood and Affect: Mood normal.        Speech: Speech normal.        Behavior: Behavior normal.        Thought Content: Thought content normal.        Judgment: Judgment normal.      UC Treatments / Results  Labs (all labs ordered are listed, but only abnormal results are displayed) Labs Reviewed - No data to display  EKG   Radiology No results found.  Procedures Procedures (including critical care time)  Medications Ordered in UC Medications - No data to display  Initial Impression / Assessment and Plan / UC Course  I have reviewed the triage vital signs and the nursing notes.  Pertinent labs & imaging results that were available during my care of the patient were reviewed by me and considered in my medical decision making (see chart for details).   1.  Rash and nonspecific skin eruption, hyperpigmentation of the skin Performed chart review and looked at photo from visit in September 2023.  Today's presentation looks significantly improved compared to photo taken at appointment during initial onset prior to antibiotic use.  Suspect itching is related to healing skin.  No active infection or folliculitis visible.  No palpable axillary lymphadenopathy.  No warmth or erythema to the area.  Patient may apply Benadryl cream to the area as directed over-the-counter.  May use cetirizine to help with itch daily.  Dermatology walking referral provided.  May restart estrogen and spironolactone as  I do not believe that this is contributing to the hyperpigmentation/itch.  No indication for further antibiotics or steroids today.  She is agreeable with this plan.  Discussed physical exam and available lab work findings in clinic with patient.  Counseled patient regarding appropriate use of medications and potential side effects for all medications recommended or prescribed today. Discussed red flag signs and symptoms of worsening condition,when to call the PCP office, return to urgent care, and when to seek higher level of care in the emergency department. Patient verbalizes understanding and agreement with plan. All questions answered. Patient discharged in stable condition.    Final Clinical Impressions(s) / UC Diagnoses   Final diagnoses:  Rash and nonspecific skin eruption  Hyperpigmentation of skin     Discharge Instructions      Your rash like it is healing well. You may apply Benadryl cream to the area of discomfort as needed. Call the dermatologist listed on your paperwork to follow-up.   If you develop any new or worsening symptoms or do not improve in the next 2 to 3 days, please return.  If your symptoms are severe, please go to the emergency room.  Follow-up with your primary care provider for further evaluation and management of your symptoms as well as ongoing wellness visits.  I hope you feel better!    ED Prescriptions   None    PDMP not reviewed this encounter.   Talbot Grumbling, [place] 10/16/22 1836

## 2022-10-16 NOTE — Discharge Instructions (Addendum)
Your rash like it is healing well. You may apply Benadryl cream to the area of discomfort as needed. Call the dermatologist listed on your paperwork to follow-up.   If you develop any new or worsening symptoms or do not improve in the next 2 to 3 days, please return.  If your symptoms are severe, please go to the emergency room.  Follow-up with your primary care provider for further evaluation and management of your symptoms as well as ongoing wellness visits.  I hope you feel better!

## 2022-10-16 NOTE — Telephone Encounter (Signed)
Okay to refill? I saw you wanted her to follow up with a PCP

## 2022-10-16 NOTE — ED Triage Notes (Signed)
Pt c/o pruritic discoloration to left axilla onset approx 2 days ago. States had similar issue in 06/2022 and was seen in [facility]. Has tried using rubbing alcohol and cortisone cream w/ aloe.

## 2022-11-22 ENCOUNTER — Telehealth: Payer: Self-pay

## 2022-11-22 NOTE — Telephone Encounter (Signed)
Received a phone call from patient stating that she is having issues with metformin causing her to have diarrhea, nausea, dizziness. She stated that she thinks the medication is too strong for her and wanted to come see provider to discuss. Patient is aware of appointment on 12/12 @ 3:30.

## 2022-11-26 ENCOUNTER — Telehealth: Payer: Self-pay

## 2022-11-26 ENCOUNTER — Ambulatory Visit: Payer: Medicaid Other | Admitting: Infectious Diseases

## 2022-11-26 NOTE — Telephone Encounter (Signed)
Patient called, will not be able to come to today's appointment. Has been out of Dovato for two days and would like to come by and get samples today.   Beryle Flock, RN

## 2022-11-27 ENCOUNTER — Telehealth: Payer: Self-pay | Admitting: Internal Medicine

## 2022-11-27 DIAGNOSIS — Z789 Other specified health status: Secondary | ICD-10-CM

## 2022-11-27 MED ORDER — SPIRONOLACTONE 50 MG PO TABS: 200.0000 mg | ORAL_TABLET | Freq: Every day | ORAL | 0 refills | Status: DC

## 2022-11-27 NOTE — Telephone Encounter (Signed)
Done

## 2022-11-27 NOTE — Telephone Encounter (Signed)
New message    1. Which medications need to be refilled? (please list name of each medication and dose if known) spironolactone (ALDACTONE) 50 MG tablet  2. Which pharmacy/location (including street and city if local pharmacy) is medication to be sent to? [facility] in [place] they need a 30 day or 90 day supply? 30 day supply

## 2022-12-07 ENCOUNTER — Other Ambulatory Visit: Payer: Self-pay

## 2022-12-07 ENCOUNTER — Emergency Department (HOSPITAL_COMMUNITY)
Admission: EM | Admit: 2022-12-07 | Discharge: 2022-12-07 | Disposition: A | Discharge status: Home / Self Care | Payer: Self-pay | Attending: Emergency Medicine | Admitting: Emergency Medicine

## 2022-12-07 ENCOUNTER — Encounter (HOSPITAL_COMMUNITY): Payer: Self-pay

## 2022-12-07 DIAGNOSIS — Z7689 Persons encountering health services in other specified circumstances: Secondary | ICD-10-CM

## 2022-12-07 DIAGNOSIS — Z7984 Long term (current) use of oral hypoglycemic drugs: Secondary | ICD-10-CM | POA: Diagnosis not present

## 2022-12-07 DIAGNOSIS — Z0279 Encounter for issue of other medical certificate: Secondary | ICD-10-CM | POA: Diagnosis not present

## 2022-12-07 DIAGNOSIS — Z21 Asymptomatic human immunodeficiency virus [HIV] infection status: Secondary | ICD-10-CM | POA: Diagnosis not present

## 2022-12-07 DIAGNOSIS — E119 Type 2 diabetes mellitus without complications: Secondary | ICD-10-CM | POA: Diagnosis not present

## 2022-12-07 NOTE — ED Triage Notes (Signed)
Pt presents to ED from home requesting letter for work. Per pt, sick contact at work so now they want doctor's note to return. Pt denies fever, chills, cough, congestion, runny nose.

## 2022-12-07 NOTE — Discharge Instructions (Signed)
You were seen to obtain a temperature reading. It is 98.3. You do not have fever.

## 2022-12-07 NOTE — ED Provider Notes (Signed)
[place] EMERGENCY DEPARTMENT AT [facility] Provider Note   CSN: [id number] Arrival date & time: 12/07/22  1505     History  Chief Complaint  Patient presents with   Letter for School/Work    Rhonda Ayers is a 42 y.o. adult.  With past medical history of type 2 diabetes, HIV, transgender female who presents to the emergency department for return to work note.  Patient states that there have been multiple coworkers that have been out with flu and COVID.  He states that his work is requiring people to have their temperature checked to ensure they do not have fever before returning to work.  He denies having any symptoms such as fever, cough, sore throat, rhinorrhea, congestion, etc.  He declines having any exam or testing.  HPI     Home Medications Prior to Admission medications   Medication Sig Start Date End Date Taking? Authorizing Provider  dolutegravir-lamiVUDine (DOVATO) 50-300 MG tablet Take 1 tablet by mouth daily. 08/09/22   [place] Callas, NP  estradiol valerate (DELESTROGEN) 20 MG/ML injection Inject 1 mL (20 mg total) into the muscle every 28 (twenty-eight) days. And syringes 1 per 21 days 07/30/22   Shamleffer, Melanie Crazier, MD  gabapentin (NEURONTIN) 100 MG capsule Take 1 capsule (100 mg total) by mouth 2 (two) times daily for 5 days. 09/24/21 07/30/22  Crain, Whitney L, PA  metFORMIN (GLUCOPHAGE) 1000 MG tablet TAKE 1 TABLET(1000 MG) BY MOUTH DAILY WITH BREAKFAST FOR 7 DAYS THEN TAKE 1 TABLET(1000 MG) BY MOUTH TWICE DAILY WITH A MEAL FOR 28 DAYS 10/16/22   [place] Callas, NP  spironolactone (ALDACTONE) 50 MG tablet Take 4 tablets (200 mg total) by mouth daily. 11/27/22   Shamleffer, Melanie Crazier, MD  SYRINGE-NEEDLE, DISP, 3 ML (LUER LOCK SAFETY SYRINGES) 22G X 1" 3 ML MISC 1 Device by Does not apply route every 30 (thirty) days. 07/30/22   Shamleffer, Melanie Crazier, MD      Allergies    Tramadol and Tylenol [acetaminophen]    Review of  Systems   Review of Systems  All other systems reviewed and are negative.   Physical Exam Updated Vital Signs BP (!) 112/95 (BP Location: Right Arm)   Pulse 85   Temp 98.3 F (36.8 C) (Oral)   Resp 16   Ht '5\' 7"'$  (1.702 m)   Wt 118.8 kg   SpO2 98%   BMI 41.04 kg/m  Physical Exam Vitals and nursing note reviewed.  Constitutional:      Appearance: Normal appearance. She is not ill-appearing.  HENT:     Head: Normocephalic and atraumatic.  Eyes:     General: No scleral icterus. Pulmonary:     Effort: Pulmonary effort is normal. No respiratory distress.  Skin:    Findings: No rash.  Neurological:     General: No focal deficit present.     Mental Status: She is alert and oriented to person, place, and time.  Psychiatric:        Mood and Affect: Mood normal.        Behavior: Behavior normal.        Thought Content: Thought content normal.        Judgment: Judgment normal.     ED Results / Procedures / Treatments   Labs (all labs ordered are listed, but only abnormal results are displayed) Labs Reviewed - No data to display  EKG None  Radiology No results found.  Procedures Procedures  Medications Ordered in ED Medications - No data to display  ED Course/ Medical Decision Making/ A&P    Medical Decision Making  Initial Impression and Ddx 42 year old transgender female who presents to the emergency department for temperature check. Patient PMH that increases complexity of ED encounter: HIV, diabetes  Interpretation of Diagnostics I independent reviewed and interpreted the labs as followed: None  - I independently visualized the following imaging with scope of interpretation limited to determining acute life threatening conditions related to emergency care: N/A  Patient Reassessment and Ultimate Disposition/Management 42 year old who presents for a temperature check.  He is asymptomatic.  He is declining having any testing for COVID or flu or exam.  He  states he just wanted a temperature check to be able to return to work today.  Vitals were taken on triage which were normal.  Is afebrile.  Again he has no complaints.  Will discharge.  The patient has been appropriately medically screened and/or stabilized in the ED. I have low suspicion for any other emergent medical condition which would require further screening, evaluation or treatment in the ED or require inpatient management. At time of discharge the patient is hemodynamically stable and in no acute distress. I have discussed work-up results and diagnosis with patient and answered all questions. Patient is agreeable with discharge plan. We discussed strict return precautions for returning to the emergency department and they verbalized understanding.     Patient management required discussion with the following services or consulting groups:  None  Complexity of Problems Addressed Acute uncomplicated illness or injury with no diagnostics  Additional Data Reviewed and Analyzed Further history obtained from: Past medical history and medications listed in the EMR  Patient Encounter Risk Assessment SDOH impact on management  Final Clinical Impression(s) / ED Diagnoses Final diagnoses:  Return to work evaluation    Rx / DC Orders ED Discharge Orders     None         Bing Matter 12/07/22 [place], DO 12/07/22 1538

## 2022-12-11 ENCOUNTER — Emergency Department (HOSPITAL_COMMUNITY)
Admission: EM | Admit: 2022-12-11 | Discharge: 2022-12-11 | Disposition: A | Discharge status: Home / Self Care | Payer: Managed Care, Other (non HMO) | Attending: Emergency Medicine | Admitting: Emergency Medicine

## 2022-12-11 DIAGNOSIS — Z7689 Persons encountering health services in other specified circumstances: Secondary | ICD-10-CM

## 2022-12-11 DIAGNOSIS — E119 Type 2 diabetes mellitus without complications: Secondary | ICD-10-CM | POA: Insufficient documentation

## 2022-12-11 DIAGNOSIS — Z0279 Encounter for issue of other medical certificate: Secondary | ICD-10-CM | POA: Insufficient documentation

## 2022-12-11 DIAGNOSIS — Z7984 Long term (current) use of oral hypoglycemic drugs: Secondary | ICD-10-CM | POA: Insufficient documentation

## 2022-12-11 DIAGNOSIS — Z21 Asymptomatic human immunodeficiency virus [HIV] infection status: Secondary | ICD-10-CM | POA: Insufficient documentation

## 2022-12-11 NOTE — ED Provider Notes (Signed)
[place] Provider Note   CSN: [id number] Arrival date & time: 12/11/22  1657     History  Chief Complaint  Patient presents with   Letter for School/Work    Rhonda Ayers is a 42 y.o. adult with a past medical history significant for HIV, depression, type 2 diabetes who presents to the ED requesting a work note.  Patient states someone came into work today sick and he is requesting to be out of work for 2 days until "work figures it out".  He notes he has a 73-monthold at home.  Patient is currently asymptomatic.  Patient does not want to be tested for COVID or flu.   History obtained from patient and past medical records. No interpreter used during encounter.       Home Medications Prior to Admission medications   Medication Sig Start Date End Date Taking? Authorizing Provider  dolutegravir-lamiVUDine (DOVATO) 50-300 MG tablet Take 1 tablet by mouth daily. 08/09/22   [place] Callas NP  estradiol valerate (DELESTROGEN) 20 MG/ML injection Inject 1 mL (20 mg total) into the muscle every 28 (twenty-eight) days. And syringes 1 per 21 days 07/30/22   Shamleffer, IMelanie Crazier MD  gabapentin (NEURONTIN) 100 MG capsule Take 1 capsule (100 mg total) by mouth 2 (two) times daily for 5 days. 09/24/21 07/30/22  Crain, Whitney L, PA  metFORMIN (GLUCOPHAGE) 1000 MG tablet TAKE 1 TABLET(1000 MG) BY MOUTH DAILY WITH BREAKFAST FOR 7 DAYS THEN TAKE 1 TABLET(1000 MG) BY MOUTH TWICE DAILY WITH A MEAL FOR 28 DAYS 10/16/22   DRaleigh Callas NP  spironolactone (ALDACTONE) 50 MG tablet Take 4 tablets (200 mg total) by mouth daily. 11/27/22   Shamleffer, IMelanie Crazier MD  SYRINGE-NEEDLE, DISP, 3 ML (LUER LOCK SAFETY SYRINGES) 22G X 1" 3 ML MISC 1 Device by Does not apply route every 30 (thirty) days. 07/30/22   Shamleffer, IMelanie Crazier MD      Allergies    Tramadol and Tylenol [acetaminophen]    Review of Systems   Review of Systems   Constitutional:  Negative for chills and fever.  HENT:  Negative for rhinorrhea and sore throat.   Eyes:  Negative for visual disturbance.  Respiratory:  Negative for shortness of breath.   Cardiovascular:  Negative for chest pain and palpitations.  Gastrointestinal:  Negative for abdominal pain.  Genitourinary:  Negative for dysuria.  Musculoskeletal:  Negative for myalgias.  Skin:  Negative for color change and rash.  Neurological:  Negative for dizziness and light-headedness.    Physical Exam Updated Vital Signs BP 131/77 (BP Location: Left Arm)   Pulse 87   Temp 98.3 F (36.8 C) (Oral)   Resp 20   SpO2 99%  Physical Exam Vitals and nursing note reviewed.  Constitutional:      General: She is not in acute distress.    Appearance: She is not ill-appearing.  HENT:     Head: Normocephalic.  Eyes:     Pupils: Pupils are equal, round, and reactive to light.  Cardiovascular:     Rate and Rhythm: Normal rate and regular rhythm.     Pulses: Normal pulses.     Heart sounds: Normal heart sounds. No murmur heard.    No friction rub. No gallop.  Pulmonary:     Effort: Pulmonary effort is normal.     Breath sounds: Normal breath sounds.  Abdominal:     General: Abdomen is flat. There is no  distension.     Palpations: Abdomen is soft.     Tenderness: There is no abdominal tenderness. There is no guarding or rebound.  Musculoskeletal:        General: Normal range of motion.     Cervical back: Neck supple.  Skin:    General: Skin is warm and dry.  Neurological:     General: No focal deficit present.     Mental Status: She is alert.  Psychiatric:        Mood and Affect: Mood normal.        Behavior: Behavior normal.     ED Results / Procedures / Treatments   Labs (all labs ordered are listed, but only abnormal results are displayed) Labs Reviewed - No data to display  EKG None  Radiology No results found.  Procedures Procedures    Medications Ordered in  ED Medications - No data to display  ED Course/ Medical Decision Making/ A&P                             Medical Decision Making  42 year old female requesting a work note to be out of work for 2 days after a coworker came in sick today.  Patient is currently asymptomatic.  Patient declined being tested for COVID or flu.  I had a long discussion with patient that I am unable to write him out of work for 2 days given his asymptomatic and declined any testing.  Patient was provided a note that he was in the ED today and can return tomorrow if he remains asymptomatic. Benign physical exam. Normal vitals. Patient stable for discharge. Strict ED precautions discussed with patient. Patient states understanding and agrees to plan. Patient discharged home in no acute distress and stable vitals  Has PCP Hx HIV       Final Clinical Impression(s) / ED Diagnoses Final diagnoses:  Return to work evaluation    Rx / DC Orders ED Discharge Orders     None         Karie Kirks 12/11/22 1732    Fredia Sorrow, MD 12/12/22 1520

## 2022-12-11 NOTE — ED Notes (Signed)
Pt does not want a resp viral test. Pt states that he wants a work excuse for 2 days because he knows there are sick people at work and that he was exposed. Pt does not want to go to work so he wants a doctors excuse.  Pt denies any respiratory complaints or symptoms.

## 2022-12-11 NOTE — ED Triage Notes (Signed)
Pt requesting work note for work.  Per pt sick contacts at work with flu and doesn't want to go back to work until sickness is resolved.  Pt denies flu s/sy

## 2022-12-12 ENCOUNTER — Encounter (HOSPITAL_COMMUNITY): Payer: Self-pay | Admitting: Emergency Medicine

## 2022-12-12 ENCOUNTER — Other Ambulatory Visit: Payer: Self-pay

## 2022-12-12 ENCOUNTER — Telehealth: Payer: Self-pay

## 2022-12-12 ENCOUNTER — Ambulatory Visit (HOSPITAL_COMMUNITY)
Admission: EM | Admit: 2022-12-12 | Discharge: 2022-12-12 | Disposition: A | Discharge status: Home / Self Care | Payer: Self-pay | Attending: Emergency Medicine | Admitting: Emergency Medicine

## 2022-12-12 DIAGNOSIS — E1165 Type 2 diabetes mellitus with hyperglycemia: Secondary | ICD-10-CM

## 2022-12-12 DIAGNOSIS — J069 Acute upper respiratory infection, unspecified: Secondary | ICD-10-CM

## 2022-12-12 LAB — POCT URINALYSIS DIPSTICK, ED / UC
Bilirubin Urine: NEGATIVE
Glucose, UA: NEGATIVE mg/dL
Hgb urine dipstick: NEGATIVE
Ketones, ur: NEGATIVE mg/dL
Leukocytes,Ua: NEGATIVE
Nitrite: NEGATIVE
Protein, ur: NEGATIVE mg/dL
Specific Gravity, Urine: 1.025 (ref 1.005–1.030)
Urobilinogen, UA: 0.2 mg/dL (ref 0.0–1.0)
pH: 5.5 (ref 5.0–8.0)

## 2022-12-12 LAB — CBG MONITORING, ED: Glucose-Capillary: 144 mg/dL — ABNORMAL HIGH (ref 70–99)

## 2022-12-12 NOTE — Telephone Encounter (Signed)
See above

## 2022-12-12 NOTE — Telephone Encounter (Signed)
Received call in triage from patient with concerns of most recent ED visit. States she was seen because she was concerned that she may be having possible side effects after starting metformin. States she has been having loose stools since starting the medication and does not fell like her stools have been formed at all since starting. Routing to pharmacy so they can follow up with patient as Rhonda Custard, NP is currently out of the office. Patient also due renew RW/ADAP and has an appointment with [name] next week, but she wanted go ahead and come in for labs prior to visit.   [name], LPN

## 2022-12-12 NOTE — ED Triage Notes (Addendum)
Patient complains of feeling weak, not feeling well.  Complains of headache, stuffy nose, congestion.  Patient seen at [facility] ED back to back  Repetitively says he cannot be at work feeling like this.  Patient is non-compliant with medicines.  Reports he has an appt coming up this week.

## 2022-12-12 NOTE — Telephone Encounter (Signed)
Called Special to discuss nausea and diarrhea associated with Metformin started in December. She noted that these symptoms were significant and were interrupting her ability to perform ADLs. Counseled that she could take a half tablet daily with food to mitigate these symptoms. She said she would prefer not to take it at all as she had been extremely ill and feels that she has been managing her diabetes well with lifestyle interventions. Follow up scheduled with Colletta [place] on 12/17/22.

## 2022-12-12 NOTE — ED Provider Notes (Signed)
[place]    CSN: [id number] Arrival date & time: 12/12/22  1313      History   Chief Complaint Chief Complaint  Patient presents with   Dizziness    HPI Rhonda Ayers is a 42 y.o. adult.   Reports he has been feeling unwell for the past 72 hours, reports he feels weak and unwell, with a sore throat and that something is not right in his body. He does get his labs done Monday. Nasal congestion and fatigue. Has been on metformin since December, stopped taking this a few days, felt like the medication was too strong, but reports this was prior to him feeling unwell. Stopped hormonal oral therapy 3 months ago. Declined Covid-19 testing in clinic, reports negative home tests a few weeks ago. Reports he has been working 21 days straight.   ID appointment on March 4th for HIV, reports they are checking A1C at this appt.   The history is provided by the patient.  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   Type 2 diabetes mellitus without complication, without long-term current use of insulin ([place]) 09/24/2022   Work related injury 09/01/2018    Patient Active Problem List   Diagnosis Date Noted   Type 2 diabetes mellitus without complication, without long-term current use of insulin ([place]) 09/24/2022   Weight gain 07/31/2022   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 Authorizing Provider  dolutegravir-lamiVUDine (DOVATO) 50-300 MG tablet Take 1 tablet by mouth daily. 08/09/22   [name], Melton Krebs, NP  SYRINGE-NEEDLE, DISP, 3 ML (LUER LOCK SAFETY SYRINGES) 22G X 1" 3 ML MISC 1 Device by Does not apply route every 30 (thirty) days. 07/30/22   Shamleffer, Melanie Crazier, MD    Family History Family History  Problem Relation Age of Onset   Diabetes Mother     Social History Social History   Tobacco Use   Smoking status: Light Smoker    Packs/day: 0.10    Types: Cigarettes   Smokeless tobacco: Never   Tobacco comments:    "2 cigarettes a day"   Vaping Use   Vaping Use: Never used  Substance Use Topics   Alcohol use: Not Currently   Drug use: Not Currently    Types: Marijuana     Allergies   Tramadol and Tylenol [acetaminophen]   Review of Systems Review of Systems  Constitutional:  Positive for fatigue. Negative for chills and fever.  HENT:  Positive for rhinorrhea and sore throat. Negative for ear pain.  Eyes:  Negative for pain and visual disturbance.  Respiratory:  Negative for cough and shortness of breath.   Cardiovascular:  Negative for chest pain and palpitations.  Gastrointestinal:  Negative for abdominal pain and vomiting.  Genitourinary:  Negative for dysuria and hematuria.  Musculoskeletal:  Negative for arthralgias and back pain.  Skin:  Negative for color change and rash.  Neurological:  Positive for dizziness and light-headedness. Negative for seizures and syncope.  All other systems reviewed and are negative.    Physical Exam Triage Vital Signs ED Triage Vitals  Enc Vitals Group     BP 12/12/22 1450  131/84     Pulse Rate 12/12/22 1450 75     Resp --      Temp 12/12/22 1450 (!) 97.5 F (36.4 C)     Temp Source 12/12/22 1450 Oral     SpO2 12/12/22 1450 96 %     Weight --      Height --      Head Circumference --      Peak Flow --      Pain Score 12/12/22 1446 0     Pain Loc --      Pain Edu? --      Excl. in [place]? --    No data found.  Updated Vital Signs BP 131/84 (BP Location: Left Arm) Comment (BP Location): large cuff  Pulse 75   Temp (!) 97.5 F (36.4 C) (Oral)   SpO2 96%   Visual Acuity Right Eye Distance:   Left Eye Distance:   Bilateral Distance:    Right Eye Near:   Left Eye Near:    Bilateral Near:     Physical Exam Vitals and nursing note reviewed.  Constitutional:      General: She is not in acute distress.    Appearance: Normal appearance. She is well-developed.  HENT:     Head: Normocephalic and atraumatic.     Right Ear: External ear normal.     Left Ear: External ear normal.     Nose: Rhinorrhea present.     Mouth/Throat:     Mouth: Mucous membranes are moist.     Pharynx: Posterior oropharyngeal erythema present.  Eyes:     Conjunctiva/sclera: Conjunctivae normal.     Pupils: Pupils are equal, round, and reactive to light.  Cardiovascular:     Rate and Rhythm: Normal rate and regular rhythm.     Heart sounds: No murmur heard. Pulmonary:     Effort: Pulmonary effort is normal. No respiratory distress.     Breath sounds: Normal breath sounds.  Musculoskeletal:        General: No swelling. Normal range of motion.     Cervical back: Normal range of motion and neck supple.  Skin:    General: Skin is warm and dry.     Capillary Refill: Capillary refill takes less than 2 seconds.  Neurological:     Mental Status: She is alert.  Psychiatric:        Mood and Affect: Mood normal.      UC Treatments / Results  Labs (all labs ordered are listed, but only abnormal results are displayed) Labs Reviewed  CBG MONITORING, ED - Abnormal;  Notable for the following components:      Result Value   Glucose-Capillary 144 (*)    All other components within normal limits  POCT URINALYSIS DIPSTICK, ED / UC    EKG   Radiology No results found.  Procedures Procedures (  including critical care time)  Medications Ordered in UC Medications - No data to display  Initial Impression / Assessment and Plan / UC Course  I have reviewed the triage vital signs and the nursing notes.  Pertinent labs & imaging results that were available during my care of the patient were reviewed by me and considered in my medical decision making (see chart for details).  Vital signs triage note reviewed, patient is hemodynamically stable.  Blood glucose 144 in clinic, has not been taking his metformin.  Urinalysis without leukocytes, nitrites, ketones, glucose. Low suspicion for DKA.  Advised to follow-up with a primary care and infectious disease.  Suspect symptoms are due to a viral illness, multiple recent exposures at work.  Declined Covid-19 testing in clinic. Plan of care, follow-up care and emergency precautions discussed, patient verbalized understanding.     Final Clinical Impressions(s) / UC Diagnoses   Final diagnoses:  Viral upper respiratory illness  Type 2 diabetes mellitus with hyperglycemia, without long-term current use of insulin ([place])     Discharge Instructions      Your urine was without signs of infection or dehydration in the clinic.  Your blood sugar was 144, please follow-up with Infectious Disease and get established with a primary care for further management of your diabetes. With your nasal congestion and fatigue, I suspect you have a viral upper respiratory infection.  You can take Tylenol and ibuprofen as needed for fever and discomfort.  Please seek immediate care if you develop worsening vision, chest pain, shortness of breath, or any changes.     ED Prescriptions   None    I have reviewed the PDMP during this  encounter.   Iridian Reader, [place] [address], [place] 12/12/22 [phone]

## 2022-12-12 NOTE — Discharge Instructions (Addendum)
Your urine was without signs of infection or dehydration in the clinic.  Your blood sugar was 144, please follow-up with Infectious Disease and get established with a primary care for further management of your diabetes. With your nasal congestion and fatigue, I suspect you have a viral upper respiratory infection.  You can take Tylenol and ibuprofen as needed for fever and discomfort.  Please seek immediate care if you develop worsening vision, chest pain, shortness of breath, or any changes.

## 2022-12-17 ENCOUNTER — Encounter: Payer: Self-pay | Admitting: Infectious Diseases

## 2022-12-17 ENCOUNTER — Ambulatory Visit (INDEPENDENT_AMBULATORY_CARE_PROVIDER_SITE_OTHER): Payer: Self-pay | Admitting: Infectious Diseases

## 2022-12-17 ENCOUNTER — Other Ambulatory Visit: Payer: Self-pay

## 2022-12-17 VITALS — BP 107/73 | HR 67 | Temp 97.8°F | Ht 66.0 in | Wt 250.0 lb

## 2022-12-17 DIAGNOSIS — B349 Viral infection, unspecified: Secondary | ICD-10-CM | POA: Diagnosis not present

## 2022-12-17 DIAGNOSIS — B20 Human immunodeficiency virus [HIV] disease: Secondary | ICD-10-CM

## 2022-12-17 DIAGNOSIS — E119 Type 2 diabetes mellitus without complications: Secondary | ICD-10-CM | POA: Diagnosis not present

## 2022-12-17 DIAGNOSIS — F649 Gender identity disorder, unspecified: Secondary | ICD-10-CM | POA: Diagnosis not present

## 2022-12-17 NOTE — Patient Instructions (Signed)
Back to see me in 6 months to check in

## 2022-12-17 NOTE — Assessment & Plan Note (Addendum)
No desires to continue HRT. Will check estradiol and testosterone levels today to ensure normalization.  I corrected pronouns, gender identity and preferred name with his permission today

## 2022-12-17 NOTE — Progress Notes (Signed)
Subjective:    Patient ID: Rhonda Ayers, adult    DOB: [birth year], 42 y.o.   MRN: [id number]  Chief Complaint  Patient presents with   Follow-up      HPI: Rhonda Ayers is a 42 y.o. adult with well controlled HIV on once daily Biktarvy with VL < 20 and CD4 550.   Had a fever of 102 F last week and associated fevers, chills.  He is feeling much better now and fully recovered.  For diabetes management he has been working out hard and trying to control with diet and exercise. Making more concerted efforts to incorporate more vegetables unprocessed foods.  Down to 2 cigarettes every other day. Down to cannabis a few times a week.  64m ago stopped HRT and no desires to go back on at this time. Feels better without and prefers female identity.  We spent more time specifically exploring this as he felt comfortable sharing.   Allergies  Allergen Reactions   Tramadol Other (See Comments)    "shakes"   Tylenol [Acetaminophen]      Outpatient Medications Prior to Visit  Medication Sig Dispense Refill   dolutegravir-lamiVUDine (DOVATO) 50-300 MG tablet Take 1 tablet by mouth daily. 30 tablet 11   SYRINGE-NEEDLE, DISP, 3 ML (LUER LOCK SAFETY SYRINGES) 22G X 1" 3 ML MISC 1 Device by Does not apply route every 30 (thirty) days. (Patient not taking: Reported on 12/17/2022) 10 each 3   No facility-administered medications prior to visit.     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  Eyes:  Negative for visual disturbance.  Respiratory:  Negative for cough and shortness of breath.   Cardiovascular:  Negative for chest pain and leg swelling.  Gastrointestinal:  Negative for abdominal pain, diarrhea and nausea.  Genitourinary:  Negative for dysuria, genital sores and penile discharge.  Musculoskeletal:  Negative for joint swelling.  Skin:  Negative for color change and rash.  Neurological:  Negative for dizziness and headaches.  Hematological:  Negative for adenopathy.  Psychiatric/Behavioral:  Negative for sleep disturbance. The patient is not nervous/anxious.         Objective:    BP 107/73   Pulse 67   Temp 97.8 F (36.6 C) (Temporal)   Ht 5\' 6"  (1.676 m)   Wt 250 lb (113.4 kg)   SpO2 96%   BMI 40.35 kg/m  Nursing note and vital signs reviewed.   Physical Exam Constitutional:      Appearance: Normal appearance. She is not ill-appearing.  HENT:     Head: Normocephalic.     Mouth/Throat:     Mouth: Mucous membranes are moist.  Pharynx: Oropharynx is clear.  Eyes:     General: No scleral icterus. Pulmonary:     Effort: Pulmonary effort is normal.  Musculoskeletal:        General: Normal range of motion.     Cervical back: Normal range of motion.  Skin:    Coloration: Skin is not jaundiced or pale.  Neurological:     Mental Status: She is alert and oriented to person, place, and time.  Psychiatric:        Mood and Affect: Mood normal.        Judgment: Judgment normal.         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 No need to recheck any labs today.  Will continue the prescription for him. Menveo and Prevnar 20 vaccinations due in 2027. Would like to get him set up for an anal Pap for recommended anorectal cancer screening.  Will discuss more at our next visit in 6 months.      Relevant Orders   Estradiol (Completed)   Hemoglobin A1c (Completed)   Other/Misc lab test   Gender dysphoria    No desires to continue HRT. Will check estradiol and testosterone levels today to ensure normalization.  I corrected pronouns, gender identity and preferred name with his permission today      Relevant Orders   Other/Misc lab test   Type 2 diabetes mellitus without complication, without long-term current use of insulin ([place])    Would prefer to continue lifestyle modifications as primary treatment right now.  Did not tolerate the metformin well due to very unpleasant GI side effects.  Either way I would like for him to continue with nutrition and movement as part of his treatment for this. Would also recommend addition of a statin for cardioprotective effect in lieu of diabetes and HIV diagnosis.  We will discuss this further at her next visit. If we cannot get his A1c down enough will need primary care team's help for recommended maintenance regimen and glucose monitoring at home.      RESOLVED: Viral illness    Janene Madeira, MSN, NP-C [facility] [email or web address] Pager: [phone] Office: [place]: [phone]

## 2022-12-20 LAB — HEMOGLOBIN A1C
Hgb A1c MFr Bld: 6.1 % of total Hgb — ABNORMAL HIGH (ref <5.7)
Mean Plasma Glucose: 128 mg/dL
eAG (mmol/L): 7.1 mmol/L

## 2022-12-20 LAB — CP TESTOSTERONE, BIO-FEMALE/CHILDREN
Albumin: 4.6 g/dL (ref 3.6–5.1)
Sex Hormone Binding: 40.8 nmol/L (ref 17–124)
TESTOSTERONE, BIOAVAILABLE: 196.2 ng/dL — ABNORMAL HIGH (ref 0.5–8.5)
Testosterone, Free: 93.4 pg/mL — ABNORMAL HIGH (ref 0.2–5.0)
Testosterone, Total, LC-MS-MS: 772 ng/dL — ABNORMAL HIGH (ref 2–45)

## 2022-12-20 LAB — ESTRADIOL: Estradiol: 42 pg/mL

## 2022-12-28 NOTE — Assessment & Plan Note (Addendum)
Would prefer to continue lifestyle modifications as primary treatment right now.  Did not tolerate the metformin well due to very unpleasant GI side effects.  Either way I would like for him to continue with nutrition and movement as part of his treatment for this. Would also recommend addition of a statin for cardioprotective effect in lieu of diabetes and HIV diagnosis.  We will discuss this further at her next visit. If we cannot get his A1c down enough will need primary care team's help for recommended maintenance regimen and glucose monitoring at home.

## 2022-12-28 NOTE — Assessment & Plan Note (Signed)
Very well-controlled on once daily Dovato. No need to recheck any labs today.  Will continue the prescription for him. Menveo and Prevnar 20 vaccinations due in 2027. Would like to get him set up for an anal Pap for recommended anorectal cancer screening.  Will discuss more at our next visit in 6 months.

## 2022-12-31 ENCOUNTER — Ambulatory Visit: Payer: Medicaid Other | Admitting: Family Medicine

## 2023-01-25 ENCOUNTER — Other Ambulatory Visit: Payer: Self-pay

## 2023-01-25 DIAGNOSIS — L84 Corns and callosities: Secondary | ICD-10-CM

## 2023-01-25 DIAGNOSIS — M21619 Bunion of unspecified foot: Secondary | ICD-10-CM

## 2023-01-25 DIAGNOSIS — E119 Type 2 diabetes mellitus without complications: Secondary | ICD-10-CM

## 2023-01-29 ENCOUNTER — Ambulatory Visit: Payer: Medicaid Other | Admitting: Internal Medicine

## 2023-01-29 NOTE — Progress Notes (Deleted)
Name: Rhonda Ayers  MRN/ DOB: [id number], 01/28/1981    Age/ Sex: 42 y.o., adult     PCP: No primary care provider on file.   Reason for Endocrinology Evaluation: Gender Dysphoria      Initial Endocrinology Clinic Visit: 09/21/2021    PATIENT IDENTIFIER: Rhonda Ayers is a 42 y.o., adult with a past medical history of HIV, gender dysphoria. She has followed with [facility] since 09/21/2021 for consultative assistance with management of her gender dysphoria.   HISTORICAL SUMMARY: The patient was diagnosed with gender dysphoria and has been on gender affirming therapy since 2020.  Estrogen in 2022  No h/o DVT, dyslipidemia, liver disease, kidney disease, migraine, sleep apnea, heart disease, CVA, acne, HTN, epilepsy, gallbladder disease, blood disorders, osteoporosis, or cancer.  SUBJECTIVE:    Today (01/29/2023):  Rhonda Ayers is here for a follow-up on gender dysphoria.   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 She has been smoking an average of .1 packs per day. She has never used smokeless tobacco. She reports that she does not currently use alcohol. She reports that she does not currently use drugs after having used the following drugs: Marijuana. Family History:  Family History  Problem Relation Age of Onset   Diabetes Mother      HOME MEDICATIONS: Allergies as of 01/29/2023       Reactions   Tramadol Other (See Comments)   "shakes"   Tylenol [acetaminophen]         Medication List        Accurate as of January 29, 2023  7:00 AM. If you have any questions, ask your nurse or doctor.          Dovato 50-300 MG tablet Generic drug: dolutegravir-lamiVUDine Take 1 tablet by mouth daily.   Luer Lock Safety Syringes 22G X 1" 3 ML Misc Generic drug: SYRINGE-NEEDLE (DISP) 3 ML 1 Device by Does not apply route every 30 (thirty) days.          OBJECTIVE:   PHYSICAL EXAM: VS: There were no vitals taken for this visit.   EXAM: General: Pt appears well and is in NAD  Eyes: External eye exam normal without stare, lid lag or exophthalmos.  EOM intact.    Neck: General: Supple without adenopathy. Thyroid: Thyroid size normal.  No goiter or nodules appreciated. No thyroid bruit.  Lungs: Clear with good BS bilat with no rales, rhonchi, or wheezes  Heart: Auscultation: RRR.  Abdomen: Normoactive bowel sounds, soft, nontender, without masses or organomegaly palpable  Extremities:  BL LE: No pretibial edema normal ROM and strength.  Mental Status: Judgment, insight: Intact Orientation: Oriented to time, place, and person Mood and affect: No depression, anxiety, or agitation     DATA REVIEWED:   Latest Reference Range & Units 07/27/22 11:55  Sodium 135 - 145 mEq/L 132 (L)  Potassium 3.5 - 5.1 mEq/L 4.1  Chloride 96 - 112 mEq/L 99  CO2 19 - 32 mEq/L 25  Glucose 70 - 99 mg/dL  409 (H)  BUN 6 - 23 mg/dL 12  Creatinine 8.11 - 9.14 mg/dL 7.82  Calcium 8.4 - 95.6 mg/dL 21.3  GFR >08.65 mL/min 66.97     Latest Reference Range & Units 07/27/22 11:55  Prolactin ng/mL 17.3  Glucose 70 - 99 mg/dL 784 (H)  Estradiol pg/mL 445 (H)  Testosterone 15.00 - 40.00 ng/dL 69.62 (H)     ASSESSMENT / PLAN / RECOMMENDATIONS:   Gender Dysphoria :   -Estrogen level continues to be above goal <200 pg/ml  -Testosterone level at goal -We discussed increased risk of thromboembolic events as well as breast cancer with estrogen intake -I will reduce estrogen level as below -Patient is questioning if she can preserve sperm for future use, unfortunately the patient has been on spironolactone with low testosterone for 3 years, I did explain to the patient that I am unable to say for certain but the patient will have to be of spironolactone and may need to follow-up with an infertility endocrinologist if this is needed in the future  Medications   Decrease estradiol valuate /mL to 20 mg (1 mL) monthly Continue spironolactone 50 mg 4 tabs daily  Follow-up in 6 months  Signed electronically by: Lyndle Herrlich, MD  [facility] End[facility]  [facility] [address] [place], [place] [address] Phone: [phone] FAX: [phone]      CC: No primary care provider on file. No primary provider on file. Phone: None  Fax: None   Return to Endocrinology clinic as below: Future Appointments  Date Time Provider Department Center  01/29/2023  8:10 AM Tajana Crotteau, Konrad Dolores, MD [facility] None  07/01/2023  1:45 PM Blanchard Kelch, NP [facility]

## 2023-02-13 ENCOUNTER — Encounter (HOSPITAL_COMMUNITY): Payer: Self-pay

## 2023-02-13 ENCOUNTER — Emergency Department (HOSPITAL_COMMUNITY): Payer: Self-pay

## 2023-02-13 ENCOUNTER — Emergency Department (HOSPITAL_COMMUNITY): Payer: Medicaid Other

## 2023-02-13 ENCOUNTER — Emergency Department (HOSPITAL_COMMUNITY)
Admission: EM | Admit: 2023-02-13 | Discharge: 2023-02-14 | Disposition: A | Discharge status: Home / Self Care | Payer: Self-pay | Attending: Emergency Medicine | Admitting: Emergency Medicine

## 2023-02-13 DIAGNOSIS — Y9301 Activity, walking, marching and hiking: Secondary | ICD-10-CM | POA: Insufficient documentation

## 2023-02-13 DIAGNOSIS — S93491A Sprain of other ligament of right ankle, initial encounter: Secondary | ICD-10-CM | POA: Insufficient documentation

## 2023-02-13 DIAGNOSIS — Z21 Asymptomatic human immunodeficiency virus [HIV] infection status: Secondary | ICD-10-CM | POA: Insufficient documentation

## 2023-02-13 DIAGNOSIS — W108XXA Fall (on) (from) other stairs and steps, initial encounter: Secondary | ICD-10-CM | POA: Insufficient documentation

## 2023-02-13 DIAGNOSIS — S93401A Sprain of unspecified ligament of right ankle, initial encounter: Secondary | ICD-10-CM

## 2023-02-13 DIAGNOSIS — E119 Type 2 diabetes mellitus without complications: Secondary | ICD-10-CM | POA: Insufficient documentation

## 2023-02-13 MED ORDER — IBUPROFEN 200 MG PO TABS
600.0000 mg | ORAL_TABLET | Freq: Once | ORAL | Status: AC
  Administered 2023-02-13: 600 mg via ORAL
  Filled 2023-02-13: qty 3

## 2023-02-13 NOTE — Discharge Instructions (Signed)
You were seen in the emergency department for your ankle pain after your fall.  He had no broken bones on your x-ray or CAT scan and you likely sprained your ankle.  We have given you an Ace wrap to wear for support and to help with the swelling.  You should continue to ice your ankle and keep it elevated and you can take ibuprofen every 6 hours as needed for pain.  You can use the crutches as needed for ambulation but you can walk on it as you can tolerate.  You can follow-up with your primary doctor in the next few days to have your symptoms rechecked.  You should return to the emergency department for significantly worsening pain, if your toes turn blue or if you have any other new or concerning symptoms.

## 2023-02-13 NOTE — ED Triage Notes (Signed)
Pt arrived POV for right ankle pain after falling down 16 flights of steps from slipping d/t "fuzzy socks". Denis hitting his head, no LOC, or other injuries. Swelling noted to inside of right ankle, pedal pulse present +2, CNS intact.

## 2023-02-13 NOTE — ED Provider Notes (Signed)
[place] EMERGENCY DEPARTMENT AT [facility] Provider Note   CSN: [id number] Arrival date & time: 02/13/23  2033     History  Chief Complaint  Patient presents with   Ankle Pain    Rhonda Ayers is a 42 y.o. adult.  Patient is a 42 year old female with a past medical history of HIV on Edwyna Shell and diabetes presenting to the emergency department with right ankle pain.  The patient states shortly prior to arrival he was walking down the stairs and fuzzy socks when he slipped and fell down approximately 14 steps.  He denies hitting his head or losing consciousness.  He states that he fell onto his right ankle.  He states that he has not been able to ambulate since the fall.  He denies any other injuries and denies any numbness or weakness.  The history is provided by the patient.  Ankle Pain      Home Medications Prior to Admission medications   Medication Sig Start Date End Date Taking? Authorizing Provider  dolutegravir-lamiVUDine (DOVATO) 50-300 MG tablet Take 1 tablet by mouth daily. 08/09/22   [name], Gomez Cleverly, NP  SYRINGE-NEEDLE, DISP, 3 ML (LUER LOCK SAFETY SYRINGES) 22G X 1" 3 ML MISC 1 Device by Does not apply route every 30 (thirty) days. Patient not taking: Reported on 12/17/2022 07/30/22   Shamleffer, Konrad Dolores, MD      Allergies    Tramadol and Tylenol [acetaminophen]    Review of Systems   Review of Systems  Physical Exam Updated Vital Signs BP 131/85 (BP Location: Left Arm)   Pulse 87   Temp 97.8 F (36.6 C) (Oral)   Resp 18   Ht 5\' 6"  (1.676 m)   Wt 113.5 kg   SpO2 99%   BMI 40.40 kg/m  Physical Exam Vitals and nursing note reviewed.  Constitutional:      General: She is not in acute distress.    Appearance: Normal appearance.  HENT:     Head: Normocephalic and atraumatic.     Nose: Nose normal.     Mouth/Throat:     Mouth: Mucous membranes are moist.  Eyes:     Extraocular Movements: Extraocular movements intact.      Conjunctiva/sclera: Conjunctivae normal.  Cardiovascular:     Rate and Rhythm: Normal rate.     Pulses: Normal pulses.  Pulmonary:     Effort: Pulmonary effort is normal.  Abdominal:     General: Abdomen is flat.  Musculoskeletal:     Cervical back: Normal range of motion.     Comments: Swelling to R medial ankle with decreased ROM 2/2 pain, no obvious deformity, no bony tenderness to remainder of RLE, no other traumatic injury seen on exam  Skin:    General: Skin is warm and dry.  Neurological:     General: No focal deficit present.     Mental Status: She is alert and oriented to person, place, and time.  Psychiatric:        Mood and Affect: Mood normal.        Behavior: Behavior normal.     ED Results / Procedures / Treatments   Labs (all labs ordered are listed, but only abnormal results are displayed) Labs Reviewed - No data to display  EKG None  Radiology CT Ankle Right Wo Contrast  Result Date: 02/13/2023 CLINICAL DATA:  Right ankle injury, right ankle pain EXAM: CT OF THE RIGHT ANKLE WITHOUT CONTRAST TECHNIQUE: Multidetector CT imaging of the  right ankle was performed according to the standard protocol. Multiplanar CT image reconstructions were also generated. RADIATION DOSE REDUCTION: This exam was performed according to the departmental dose-optimization program which includes automated exposure control, adjustment of the mA and/or kV according to patient size and/or use of iterative reconstruction technique. COMPARISON:  None Available. FINDINGS: Bones/Joint/Cartilage Normal alignment. No acute fracture or dislocation. Degenerative changes are noted at the talonavicular articulation. Joint spaces are otherwise preserved. No lytic or blastic bone lesion. No erosions. Ligaments Suboptimally assessed by CT. Muscles and Tendons Unremarkable Soft tissues Subcutaneous edema within the plantar hindfoot. Soft tissues are otherwise unremarkable. IMPRESSION: 1. No acute fracture or  dislocation. 2. Degenerative changes at the talonavicular articulation. 3. Subcutaneous edema within the plantar hindfoot. Electronically Signed   By: Helyn Numbers M.D.   On: 02/13/2023 22:34   DG Ankle Complete Right  Result Date: 02/13/2023 CLINICAL DATA:  Right ankle pain after falling down 16 steps. EXAM: RIGHT ANKLE - COMPLETE 3+ VIEW COMPARISON:  None Available. FINDINGS: The talus has an unusual configuration on lateral view with possible posterior continuity of the talus and calcaneus on lateral view. There are lucencies through the talus on AP and oblique view. Soft tissue swelling about the ankle. IMPRESSION: Findings suspicious for nondisplaced talar neck fracture and/or talocalcaneal coalition. CT is recommended for further evaluation. Electronically Signed   By: Minerva Fester M.D.   On: 02/13/2023 21:32    Procedures Procedures    Medications Ordered in ED Medications  ibuprofen (ADVIL) tablet 600 mg (600 mg Oral Given 02/13/23 2141)    ED Course/ Medical Decision Making/ A&P Clinical Course as of 02/13/23 2309  Wed Feb 13, 2023  2140 Concern for talar fracture on XR. CT ankle will be performed.  [VK]  2238 No fracture on CT. He will be given ace wrap and offered crutches for support. [VK]    Clinical Course User Index [VK] Rexford Maus, DO                             Medical Decision Making This patient presents to the ED with chief complaint(s) of ankle pain with pertinent past medical history of HIV, DM which further complicates the presenting complaint. The complaint involves an extensive differential diagnosis and also carries with it a high risk of complications and morbidity.    The differential diagnosis includes fracture, dislocation, ankle sprain, no other traumatic injuries seen on exam, he is neurovascularly intact making neurovascular injury unlikely  Additional history obtained: Additional history obtained from N/A Records reviewed N/A  ED Course  and Reassessment: Patient was initially evaluated by triage and had right ankle x-ray performed.  X-ray was concerning for possible talar fracture and he will have a CT of his ankle performed.  No other traumatic injuries seen on exam.  He was given ice and ibuprofen.  Patient had no presyncopal symptoms a syncopal fall unlikely  Independent labs interpretation:  N/A  Independent visualization of imaging: - I independently visualized the following imaging with scope of interpretation limited to determining acute life threatening conditions related to emergency care: R ankle XR/CT, which revealed no acute traumatic injury  Consultation: - Consulted or discussed management/test interpretation w/ external professional: N/A  Consideration for admission or further workup: Patient has no emergent conditions requiring admission or further work-up at this time and is stable for discharge home with primary care follow-up  Social Determinants of health: N/A  Amount and/or Complexity of Data Reviewed Radiology: ordered.  Risk OTC drugs.          Final Clinical Impression(s) / ED Diagnoses Final diagnoses:  Sprain of right ankle, unspecified ligament, initial encounter    Rx / DC Orders ED Discharge Orders     None         Rexford Maus, DO 02/13/23 2309

## 2023-03-28 NOTE — Progress Notes (Signed)
The 10-year ASCVD risk score (Arnett DK, et al., 2019) is: 5.4%   Values used to calculate the score:     Age: 42 years     Sex: Female     Is Non-Hispanic African American: No     Diabetic: Yes     Tobacco smoker: Yes     Systolic Blood Pressure: 131 mmHg     Is BP treated: No     HDL Cholesterol: 64 mg/dL     Total Cholesterol: 245 mg/dL  Sandie Ano, RN

## 2023-04-15 ENCOUNTER — Telehealth: Payer: Self-pay

## 2023-04-15 NOTE — Telephone Encounter (Signed)
Copied from CRM [id number]. Topic: General - Other >> Apr 15, 2023  2:26 PM Dondra Prader E wrote: Reason for CRM: Pt wants to know if Juanell Fairly (funding for infectious disease) is accepted by the practice?  [phone]

## 2023-05-13 ENCOUNTER — Ambulatory Visit (INDEPENDENT_AMBULATORY_CARE_PROVIDER_SITE_OTHER): Payer: Self-pay | Admitting: Infectious Diseases

## 2023-05-13 ENCOUNTER — Encounter: Payer: Self-pay | Admitting: Infectious Diseases

## 2023-05-13 ENCOUNTER — Other Ambulatory Visit: Payer: Self-pay

## 2023-05-13 VITALS — BP 125/84 | HR 75 | Temp 97.9°F | Wt 251.6 lb

## 2023-05-13 DIAGNOSIS — Z23 Encounter for immunization: Secondary | ICD-10-CM

## 2023-05-13 DIAGNOSIS — B20 Human immunodeficiency virus [HIV] disease: Secondary | ICD-10-CM

## 2023-05-13 DIAGNOSIS — R7303 Prediabetes: Secondary | ICD-10-CM

## 2023-05-13 DIAGNOSIS — Z0000 Encounter for general adult medical examination without abnormal findings: Secondary | ICD-10-CM

## 2023-05-13 DIAGNOSIS — R079 Chest pain, unspecified: Secondary | ICD-10-CM

## 2023-05-13 HISTORY — DX: Chest pain, unspecified: R07.9

## 2023-05-13 NOTE — Patient Instructions (Addendum)
Will see you back at the end of the year to see how you are doing.   Keep up all the good work with diet changes and exercises. Your blood work was already back down to pre-diabetic range the last time we checked. It is OK if you have not started the metformin - the more important lasting change to prevent diabetes progression is to take the lifestyle changes and win with that.   Hep b booster today.   Will see you back at the end of the year .

## 2023-05-13 NOTE — Assessment & Plan Note (Signed)
Hep b booster today with recent surface antibody negative.

## 2023-05-13 NOTE — Assessment & Plan Note (Signed)
I think these episodes are more related to stress. Not reproducible under physical exercise. Family history of diabetes, hypertension and kidney failure.  He would like to discuss further with cardiology team for further work up and recommendations. Referral placed.

## 2023-05-13 NOTE — Assessment & Plan Note (Signed)
Improved A1c with lifestyle changes. Will check to see how this looks today. I am OK if he wants to defer the metformin and work on lifestyle treatment only.

## 2023-05-13 NOTE — Assessment & Plan Note (Signed)
Well controlled on Dovato - no changes to insurance today.  Vaccines updated, declined sexual health needs today.  Will discuss implementation for statin at winter visit   FU in December.

## 2023-05-13 NOTE — Progress Notes (Signed)
Subjective:    Patient ID: Rhonda Ayers, adult    DOB: 02/07/1981, 42 y.o.   MRN: [id number]    HPI: Rhonda Ayers is a 42 y.o. adult with well controlled HIV on once daily Dovato with VL < 20 and CD4 550.   Doing well since last office visit - never started the metformin but has really tried changing lifestyle and nutrition choices to reverse the concern for diabetes.  Stopped smoking cigarettes completely - made a lot of changes with foods. Eating a lot of fruit. Drinking a lot of diet V8 splash. Has open access to gym and has great energy. Only concern today is he has noticed some intermittent chest pains over the left chest. It's more associated with work when it's very busy and also with some stress of his mother's health decline, has noticed this started when she was hospitalized and not doing well.  Does feel like a sharp pain that does go away on it's own over a few minutes. No palpitations or shortness of breath. Never when he works out does he notice these pains. Nothing associated with food intake. No family history of heart disease or heart problems but mom is diabetic and now on hemodialysis. Would like to discus further with cardiology to ensure nothing concerning and if anything else to prevent would like to take that effort.   No concerns with access or adherence to dovato.    Allergies  Allergen Reactions   Tramadol Other (See Comments)    "shakes"   Tylenol [Acetaminophen]      Outpatient Medications Prior to Visit  Medication Sig Dispense Refill   dolutegravir-lamiVUDine (DOVATO) 50-300 MG tablet Take 1 tablet by mouth daily. 30 tablet 11   SYRINGE-NEEDLE, DISP, 3 ML (LUER LOCK SAFETY SYRINGES) 22G X 1" 3 ML MISC 1 Device by Does not apply route every 30 (thirty) days. (Patient not taking: Reported on 12/17/2022) 10 each 3   No facility-administered medications prior to visit.     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  Eyes:  Negative for visual disturbance.  Respiratory:  Negative for cough and shortness of breath.   Cardiovascular:  Negative for chest pain and leg swelling.  Gastrointestinal:  Negative for abdominal pain, diarrhea and nausea.  Genitourinary:  Negative for dysuria, genital sores and penile discharge.  Musculoskeletal:  Negative for joint swelling.  Skin:  Negative for color change and rash.  Neurological:  Negative for dizziness and headaches.  Hematological:  Negative for adenopathy.  Psychiatric/Behavioral:  Negative for sleep disturbance. The patient is not nervous/anxious.         Objective:    BP 125/84  Pulse 75   Temp 97.9 F (36.6 C) (Oral)   Wt 251 lb 9.6 oz (114.1 kg)   SpO2 93%   BMI 40.61 kg/m  Nursing note and vital signs reviewed.   Physical Exam Constitutional:      Appearance: Normal appearance. He is not ill-appearing.  HENT:     Head: Normocephalic.     Mouth/Throat:     Mouth: Mucous membranes are moist.     Pharynx: Oropharynx is clear.  Eyes:     General: No scleral icterus. Pulmonary:     Effort: Pulmonary effort is normal.  Musculoskeletal:        General: Normal range of motion.     Cervical back: Normal  range of motion.  Skin:    Coloration: Skin is not jaundiced or pale.  Neurological:     Mental Status: He is alert and oriented to person, place, and time.  Psychiatric:        Mood and Affect: Mood normal.        Judgment: Judgment normal.         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 Will check to see how this looks today. I am OK if he wants to defer the metformin and work on lifestyle treatment only.       Relevant Orders   Hemoglobin A1c   HIV disease (HCC)    Well controlled on Dovato - no changes to insurance today.  Vaccines updated, declined sexual health needs today.  Will discuss implementation for statin at winter visit   FU in December.       Relevant Orders   HIV 1 RNA quant-no reflex-bld   T-helper cells (CD4) count   Health care maintenance    Hep b booster today with recent surface antibody negative.       Chest pain - Primary    I think these episodes are more related to stress. Not reproducible under physical exercise. Family history of diabetes, hypertension and kidney failure.  He would like to discuss further with cardiology team for further work up and recommendations. Referral placed.      Relevant Orders   Ambulatory referral to Cardiology   Other Visit Diagnoses     Need for hepatitis B vaccination       Relevant Orders   Heplisav-B (HepB-CPG) Vaccine (Completed)        Rexene Alberts, MSN, NP-C [facility]  [email or web address] Pager:  [phone] Office: [phone] [facility] Main Line: [phone]

## 2023-06-08 ENCOUNTER — Other Ambulatory Visit: Payer: Self-pay

## 2023-06-08 ENCOUNTER — Emergency Department (HOSPITAL_COMMUNITY): Payer: Medicaid Other

## 2023-06-08 ENCOUNTER — Encounter (HOSPITAL_COMMUNITY): Payer: Self-pay | Admitting: Emergency Medicine

## 2023-06-08 ENCOUNTER — Emergency Department (HOSPITAL_COMMUNITY)
Admission: EM | Admit: 2023-06-08 | Discharge: 2023-06-08 | Disposition: A | Discharge status: Home / Self Care | Payer: Medicaid Other | Attending: Emergency Medicine | Admitting: Emergency Medicine

## 2023-06-08 DIAGNOSIS — M79671 Pain in right foot: Secondary | ICD-10-CM | POA: Insufficient documentation

## 2023-06-08 DIAGNOSIS — M25571 Pain in right ankle and joints of right foot: Secondary | ICD-10-CM | POA: Insufficient documentation

## 2023-06-08 DIAGNOSIS — Z21 Asymptomatic human immunodeficiency virus [HIV] infection status: Secondary | ICD-10-CM | POA: Insufficient documentation

## 2023-06-08 DIAGNOSIS — E119 Type 2 diabetes mellitus without complications: Secondary | ICD-10-CM | POA: Insufficient documentation

## 2023-06-08 MED ORDER — METHOCARBAMOL 500 MG PO TABS: 500.0000 mg | ORAL_TABLET | Freq: Two times a day (BID) | ORAL | 0 refills | Status: DC

## 2023-06-08 NOTE — ED Triage Notes (Signed)
Patient coming to ED for evaluation of R foot pain.  Reports they injured it 6 months ago.  Scheduled to have surgery next month.  States "I stood up and I heard a pop.  Now it is swollen and I can't put all my weight on it." Having chest pain "because of my anxiety."

## 2023-06-08 NOTE — Discharge Instructions (Addendum)
You were evaluated today for right foot pain. Your x-rays were reassuring but you may have a ligamentous injury. Please follow up with your orthopedic surgeon for further evaluation.

## 2023-06-08 NOTE — ED Provider Notes (Signed)
[place] EMERGENCY DEPARTMENT AT [facility] Provider Note   CSN: [id number] Arrival date & time: 06/08/23  2015     History  Chief Complaint  Patient presents with   Foot Pain    Rhonda Ayers is a 42 y.o. adult.  Patient presents to the emergency department complaining of right ankle pain.  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  He states he has a planned reconstructive surgery on the right foot/ankle scheduled for late September.  He states he is unable to bear weight on it without difficulty and significant pain at this time.  He endorses history of trauma to the ankle/foot from competing in track.  Medical history significant for HIV, type II DM (A1c now under 6.5), previous foot surgery on left foot  HPI     Home Medications Prior to Admission medications   Medication Sig Start Date End Date Taking? Authorizing Provider  methocarbamol (ROBAXIN) 500 MG tablet Take 1 tablet (500 mg total) by mouth 2 (two) times daily. 06/08/23  Yes Darrick Grinder, PA-C  dolutegravir-lamiVUDine (DOVATO) 50-300 MG tablet Take 1 tablet by mouth daily. 08/09/22   [name], Gomez Cleverly, NP  SYRINGE-NEEDLE, DISP, 3 ML (LUER LOCK SAFETY SYRINGES) 22G X 1" 3 ML MISC 1 Device by Does not apply route every 30 (thirty) days. Patient not taking: Reported on 12/17/2022 07/30/22   Shamleffer, Konrad Dolores, MD      Allergies    Tramadol and Tylenol [acetaminophen]    Review of Systems   Review of Systems  Physical Exam Updated Vital Signs BP (!) 135/90   Pulse 88   Temp 98.6 F (37 C) (Oral)   Resp 18   Ht 5\' 7"  (1.702 m)   Wt 110.2 kg   SpO2 100%   BMI 38.06 kg/m  Physical Exam Vitals and nursing note reviewed.  HENT:     Head: Normocephalic and atraumatic.  Cardiovascular:     Rate and Rhythm: Normal rate.  Pulmonary:     Effort: Pulmonary effort is normal. No respiratory distress.   Musculoskeletal:        General: Swelling, tenderness and signs of injury present. No deformity.     Cervical back: Normal range of motion.     Comments: Patient with tenderness palpation of the dorsum of the right foot.  Mild swelling noted in the same general area with a small amount of bruising.  Patient is able to actively range the right ankle through grossly normal range of motion with pain.  Skin:    General: Skin is dry.  Neurological:     Mental Status: He is alert.  Psychiatric:        Speech: Speech normal.        Behavior: Behavior normal.     ED Results / Procedures / Treatments   Labs (all labs ordered are listed, but only abnormal results are displayed) Labs Reviewed - No data to display  EKG None  Radiology DG Foot Complete Right  Result Date: 06/08/2023 CLINICAL DATA:  Foot pain EXAM: RIGHT FOOT COMPLETE - 3 VIEW COMPARISON:  02/13/2023 ankle radiographs FINDINGS: There is no evidence of fracture or dislocation. Redemonstrated somewhat abnormal contour of the anterior talus. Unchanged alignment. No significant soft tissue injury. IMPRESSION: No acute osseous abnormality. Electronically Signed   By: Wiliam Ke M.D.   On: 06/08/2023 21:05  Procedures .Ortho Injury Treatment  Date/Time: 06/08/2023 10:32 PM  Performed by: Darrick Grinder, PA-C Authorized by: Darrick Grinder, PA-C   Consent:    Consent obtained:  Verbal   Consent given by:  PatientInjury location: foot Location details: right foot Injury type: soft tissue Pre-procedure neurovascular assessment: neurovascularly intact Immobilization: brace Splint type: CAM walker boot. Post-procedure neurovascular assessment: post-procedure neurovascularly intact       Medications Ordered in ED Medications - No data to display  ED Course/ Medical Decision Making/ A&P                                 Medical Decision Making Risk Prescription drug management.   This patient presents to the  ED for concern of foot pain, this involves an extensive number of treatment options, and is a complaint that carries with it a high risk of complications and morbidity.  The differential diagnosis includes fracture, dislocation, soft tissue injury   Co morbidities that complicate the patient evaluation  History of ligamentous injury to the right ankle/foot   Imaging Studies ordered:  I ordered imaging studies including plain films of the right foot I independently visualized and interpreted imaging which showed no acute osseous abnormality I agree with the radiologist interpretation   Cardiac Monitoring: / EKG:  The patient was maintained on a cardiac monitor.  I personally viewed and interpreted the cardiac monitored which showed an underlying rhythm of: Sinus rhythm   Social Determinants of Health:  Patient has Medicaid as their primary health insurance type.   Test / Admission - Considered:  Patient with no acute osseous findings on plain films.  Plans for surgery within a month on the right ankle.  The patient was placed in a cam walker boot for presumed ligamentous injury and was able to ambulate.  I offered the patient crutches but he stated he had some at home.  I did prescribe a muscle relaxant as the patient complained of some muscle twitching and recommended over-the-counter ibuprofen or Naprosyn for pain control.  Patient instructed on ice and elevation.  He will follow-up with orthopedics for further management of this injury.         Final Clinical Impression(s) / ED Diagnoses Final diagnoses:  Foot pain, right    Rx / DC Orders ED Discharge Orders          Ordered    methocarbamol (ROBAXIN) 500 MG tablet  2 times daily        06/08/23 2222              Pamala Duffel 06/08/23 2233    Laurence Spates, MD 06/09/23 [phone]

## 2023-07-01 ENCOUNTER — Ambulatory Visit (INDEPENDENT_AMBULATORY_CARE_PROVIDER_SITE_OTHER): Payer: Self-pay | Admitting: Infectious Diseases

## 2023-07-01 ENCOUNTER — Encounter: Payer: Self-pay | Admitting: Infectious Diseases

## 2023-07-01 ENCOUNTER — Other Ambulatory Visit: Payer: Self-pay

## 2023-07-01 VITALS — BP 116/77 | HR 84 | Resp 16 | Ht 67.0 in | Wt 252.0 lb

## 2023-07-01 DIAGNOSIS — K5900 Constipation, unspecified: Secondary | ICD-10-CM

## 2023-07-01 DIAGNOSIS — Z113 Encounter for screening for infections with a predominantly sexual mode of transmission: Secondary | ICD-10-CM

## 2023-07-01 DIAGNOSIS — B20 Human immunodeficiency virus [HIV] disease: Secondary | ICD-10-CM

## 2023-07-01 DIAGNOSIS — A529 Late syphilis, unspecified: Secondary | ICD-10-CM

## 2023-07-01 NOTE — Patient Instructions (Addendum)
   For the constipation - I would consider doing the Magnesium Citrate drink to see if that helps you clean out better.   There are over the counter options as well like Magnesium citrate - I like this at night - start with 250 mg dose once a night to see if this helps. Don't increase too fast because it may cause some diarrhea.

## 2023-07-01 NOTE — Progress Notes (Signed)
Subjective:    Patient ID: Rhonda Ayers, adult    DOB: 1981/03/17, 42 y.o.   MRN: [id number]    HPI: Rhonda Ayers is a 42 y.o. adult with well controlled HIV on once daily Dovato with VL < 20 and CD4 550.   Not having regular bowel movements - every 4-5 days uses the bathroom. Using OTC treatments like metamucil and Dulcalax. Typically takes 2-3 hours before a  Finally had 1 good bowel movement today. Feels like massage helps and working out helps.  Nothing on skin, no ulcerations. Has had some warts / moles removed from around anus before during incarceration. We discussed anal cancer screening today and deferred until feeling less bloated.  Fluctuates from watery stool to hard lumps. Distended abdomen but not painful. Just uncomfortable.  There is some worry that this might be related to syphilis or other bacterial infection.  There is no changes to skin, no ulcerations or any lumps/lymphadenopathy.  There is no rectal or penile drainage or discharge.  Allergies  Allergen Reactions   Tramadol Other (See Comments)    "shakes"   Tylenol [Acetaminophen]      Outpatient Medications Prior to Visit  Medication Sig Dispense Refill   dolutegravir-lamiVUDine (DOVATO) 50-300 MG tablet Take 1 tablet by mouth daily. 30 tablet 11   estradiol valerate (DELESTROGEN) 20 MG/ML injection Inject 20 mg into the muscle every 28 (twenty-eight) days. (Patient not taking: Reported on 07/01/2023)     methocarbamol (ROBAXIN) 500 MG tablet Take 1 tablet (500 mg total) by mouth 2 (two) times daily. (Patient not taking: Reported on 07/01/2023) 20 tablet 0   SYRINGE-NEEDLE, DISP, 3 ML (LUER LOCK SAFETY SYRINGES) 22G X 1" 3 ML MISC 1 Device by Does not apply route every 30 (thirty) days. (Patient not taking: Reported on 12/17/2022) 10 each 3   No facility-administered medications prior to visit.     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  Eyes:  Negative for visual disturbance.  Respiratory:  Negative for cough and shortness of breath.   Cardiovascular:  Negative for chest pain and leg swelling.  Gastrointestinal:  Negative for abdominal pain, diarrhea and nausea.  Genitourinary:  Negative for dysuria, genital sores and penile discharge.  Musculoskeletal:  Negative for joint swelling.  Skin:  Negative for color change and rash.  Neurological:  Negative for dizziness and headaches.  Hematological:  Negative for adenopathy.  Psychiatric/Behavioral:  Negative for sleep disturbance. The patient is not nervous/anxious.         Objective:    BP 116/77   Pulse 84   Resp 16   Ht 5\' 7"  (1.702 m)   Wt 252  lb (114.3 kg)   SpO2 98%   BMI 39.47 kg/m  Nursing note and vital signs reviewed.   Physical Exam Constitutional:      Appearance: Normal appearance. He is not ill-appearing.  HENT:     Head: Normocephalic.     Mouth/Throat:     Mouth: Mucous membranes are moist.     Pharynx: Oropharynx is clear.  Eyes:     General: No scleral icterus. Pulmonary:     Effort: Pulmonary effort is normal.  Musculoskeletal:        General: Normal range of motion.     Cervical back: Normal range of motion.  Skin:     Coloration: Skin is not jaundiced or pale.  Neurological:     Mental Status: He is alert and oriented to person, place, and time.  Psychiatric:        Mood and Affect: Mood normal.        Judgment: Judgment normal.         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       Constipation - Primary    Discussed other OTC options for treatment - suggested magnesium citrate capsules 250 - 500 mg nightly - start slow to avoid aggressive GI motility / diarrhea.  STI screening collected - treatment if indicated.  He is paying attention to water intake / higher water/fiber content foods.  Squatty potty recommended as well.   If persistent trouble, can consider linzess.        Rexene Alberts, MSN, NP-C [facility]  [email or web address] Pager: [phone] Office: [phone] [facility] Main Line: [phone]

## 2023-07-02 LAB — URINE CYTOLOGY ANCILLARY ONLY
Chlamydia: NEGATIVE
Comment: NEGATIVE
Comment: NORMAL
Neisseria Gonorrhea: NEGATIVE

## 2023-07-02 LAB — CYTOLOGY, (ORAL, ANAL, URETHRAL) ANCILLARY ONLY
Chlamydia: NEGATIVE
Chlamydia: NEGATIVE
Comment: NEGATIVE
Comment: NEGATIVE
Comment: NORMAL
Comment: NORMAL
Neisseria Gonorrhea: NEGATIVE
Neisseria Gonorrhea: NEGATIVE

## 2023-07-03 LAB — RPR: RPR Ser Ql: REACTIVE — AB

## 2023-07-05 DIAGNOSIS — K5900 Constipation, unspecified: Secondary | ICD-10-CM | POA: Insufficient documentation

## 2023-07-05 DIAGNOSIS — Z113 Encounter for screening for infections with a predominantly sexual mode of transmission: Secondary | ICD-10-CM

## 2023-07-05 HISTORY — DX: Encounter for screening for infections with a predominantly sexual mode of transmission: Z11.3

## 2023-07-05 HISTORY — DX: Constipation, unspecified: K59.00

## 2023-07-05 NOTE — Assessment & Plan Note (Signed)
I don't suspect current symptoms would be related to GI concerns - will hold on empiric tx today and check titer.

## 2023-07-05 NOTE — Assessment & Plan Note (Signed)
Discussed other OTC options for treatment - suggested magnesium citrate capsules 250 - 500 mg nightly - start slow to avoid aggressive GI motility / diarrhea.  STI screening collected - treatment if indicated.  He is paying attention to water intake / higher water/fiber content foods.  Squatty potty recommended as well.   If persistent trouble, can consider linzess.

## 2023-07-08 ENCOUNTER — Emergency Department (HOSPITAL_COMMUNITY)
Admission: EM | Admit: 2023-07-08 | Discharge: 2023-07-08 | Disposition: A | Discharge status: Home / Self Care | Payer: Medicaid Other | Attending: Emergency Medicine | Admitting: Emergency Medicine

## 2023-07-08 DIAGNOSIS — Z0279 Encounter for issue of other medical certificate: Secondary | ICD-10-CM | POA: Insufficient documentation

## 2023-07-08 DIAGNOSIS — Z9889 Other specified postprocedural states: Secondary | ICD-10-CM | POA: Insufficient documentation

## 2023-07-08 NOTE — ED Triage Notes (Signed)
Pt had bunion surgery on L foot 8 weeks. Reports good recovery, needs clearance to go back to work.

## 2023-07-08 NOTE — ED Provider Notes (Signed)
[facility] EMERGENCY DEPARTMENT AT [facility] Provider Note   CSN: [id number] Arrival date & time: 07/08/23  1219     History  No chief complaint on file.   Maricela D Alderfer is a 42 y.o. adult.  42 year old presents with request to return to work. Patient reports having left bunion surgery 8 weeks ago in [facility]. Patient works as a Investment banker, operational, needs to return to work and cannot see his Careers adviser back in [place] for clearance to return to work. States he has been walking around without any pain or difficulty.        Home Medications Prior to Admission medications   Medication Sig Start Date End Date Taking? Authorizing Provider  dolutegravir-lamiVUDine (DOVATO) 50-300 MG tablet Take 1 tablet by mouth daily. 08/09/22   Blanchard Kelch, NP  estradiol valerate (DELESTROGEN) 20 MG/ML injection Inject 20 mg into the muscle every 28 (twenty-eight) days. Patient not taking: Reported on 07/01/2023 06/03/23   [provider]  methocarbamol (ROBAXIN) 500 MG tablet Take 1 tablet (500 mg total) by mouth 2 (two) times daily. Patient not taking: Reported on 07/01/2023 06/08/23   Barrie Dunker B, PA-C  SYRINGE-NEEDLE, DISP, 3 ML (LUER LOCK SAFETY SYRINGES) 22G X 1" 3 ML MISC 1 Device by Does not apply route every 30 (thirty) days. Patient not taking: Reported on 12/17/2022 07/30/22   Shamleffer, Konrad Dolores, MD      Allergies    Tramadol and Tylenol [acetaminophen]    Review of Systems   Review of Systems Negative except as per HPI Physical Exam Updated Vital Signs BP (!) 135/96 (BP Location: Right Arm)   Pulse 91   Temp 99.2 F (37.3 C) (Oral)   Resp 16   Ht 5\' 7"  (1.702 m)   Wt 111.1 kg   SpO2 100%   BMI 38.37 kg/m  Physical Exam Vitals and nursing note reviewed.  Constitutional:      General: He is not in acute distress.    Appearance: He is well-developed. He is not diaphoretic.  HENT:     Head: Normocephalic and atraumatic.  Cardiovascular:     Pulses: Normal  pulses.  Pulmonary:     Effort: Pulmonary effort is normal.  Musculoskeletal:        General: No swelling or tenderness.  Feet:     Comments: Surgical scar to left foot appears well healed. Able to bear weight and stand on tip toe.  Skin:    General: Skin is warm and dry.  Neurological:     Mental Status: He is alert and oriented to person, place, and time.  Psychiatric:        Behavior: Behavior normal.     ED Results / Procedures / Treatments   Labs (all labs ordered are listed, but only abnormal results are displayed) Labs Reviewed - No data to display  EKG None  Radiology No results found.  Procedures Procedures    Medications Ordered in ED Medications - No data to display  ED Course/ Medical Decision Making/ A&P                                 Medical Decision Making  42 year old patient with request for note to return to work.  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  Patient has been ambulatory without difficulty or  pain. They are unable to go to [place] for note to clear to work.  Discussed with ER attending, Dr. Renaye Rakers, patient may return to work. Patient to see podiatry for any questions or concerns.         Final Clinical Impression(s) / ED Diagnoses Final diagnoses:  Post-operative state    Rx / DC Orders ED Discharge Orders     None         Jeannie Fend, PA-C 07/08/23 1412    Terald Sleeper, MD 07/08/23 1539

## 2023-07-08 NOTE — Discharge Instructions (Signed)
Follow up with your surgeon for any concerns.

## 2023-07-12 ENCOUNTER — Other Ambulatory Visit: Payer: Self-pay | Admitting: Infectious Diseases

## 2023-07-12 DIAGNOSIS — B20 Human immunodeficiency virus [HIV] disease: Secondary | ICD-10-CM

## 2023-07-12 DIAGNOSIS — E119 Type 2 diabetes mellitus without complications: Secondary | ICD-10-CM

## 2023-07-15 NOTE — Telephone Encounter (Signed)
Per last visit w/ ID patient making lifestyle changes and no longer needs metformin

## 2023-07-17 NOTE — Telephone Encounter (Signed)
Received call from [facility] wanting to know why refill was denied. Relayed that provider is okay with deferring metformin due to improved A1c with lifestyle changes per 05/13/2023 office note.   Sandie Ano, RN

## 2023-08-12 ENCOUNTER — Ambulatory Visit: Payer: Medicaid Other | Admitting: Internal Medicine

## 2023-08-14 ENCOUNTER — Telehealth: Payer: Self-pay

## 2023-08-14 NOTE — Telephone Encounter (Signed)
LMx1 for patient to call office to schedule an appointment.

## 2023-08-14 NOTE — Telephone Encounter (Signed)
Contact patient to schedule follow up appointment. Patient was due for follow up in January and no showed last appt.

## 2023-08-20 ENCOUNTER — Other Ambulatory Visit: Payer: Self-pay

## 2023-08-20 DIAGNOSIS — F640 Transsexualism: Secondary | ICD-10-CM

## 2023-08-20 MED ORDER — SPIRONOLACTONE 50 MG PO TABS
50.0000 mg | ORAL_TABLET | Freq: Every day | ORAL | 0 refills | Status: DC
Start: 2023-08-20 — End: 2024-01-03

## 2023-08-26 ENCOUNTER — Ambulatory Visit: Payer: Medicaid Other

## 2023-08-26 ENCOUNTER — Ambulatory Visit: Payer: Self-pay | Admitting: Infectious Diseases

## 2023-08-26 NOTE — Progress Notes (Deleted)
Subjective:    Patient ID: Rhonda Ayers, adult    DOB: April 30, 1981, 42 y.o.   MRN: [id number]  Rhonda Ayers is a 42 y.o. adult with HIV, Dx 1990s, started ARV 2005.   Previous Regimens:  Atripla 2005 Triumeq 2016 Biktarvy 2018 Dovato   Discussed the use of AI scribe software for clinical note transcription with the patient, who gave verbal consent to proceed.  History of Present Illness         Rectal cancer screening Statin    Allergies  Allergen Reactions   Tramadol Other (See Comments)    "shakes"   Tylenol [Acetaminophen]      Outpatient Medications Prior to Visit  Medication Sig Dispense Refill   spironolactone (ALDACTONE) 50 MG tablet Take 1 tablet (50 mg total) by mouth daily. 90 tablet 0   dolutegravir-lamiVUDine (DOVATO) 50-300 MG tablet Take 1 tablet by mouth daily. 30 tablet 11   estradiol valerate (DELESTROGEN) 20 MG/ML injection Inject 20 mg into the muscle every 28 (twenty-eight) days. (Patient not taking: Reported on 07/01/2023)     methocarbamol (ROBAXIN) 500 MG tablet Take 1 tablet (500 mg total) by mouth 2 (two) times daily. (Patient not taking: Reported on 07/01/2023) 20 tablet 0   SYRINGE-NEEDLE, DISP, 3 ML (LUER LOCK SAFETY SYRINGES) 22G X 1" 3 ML MISC 1 Device by Does not apply route every 30 (thirty) days. (Patient not taking: Reported on 12/17/2022) 10 each 3   No facility-administered medications prior to visit.     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  Eyes:  Negative for visual disturbance.  Respiratory:  Negative for cough and shortness of breath.   Cardiovascular:  Negative for chest pain and leg swelling.  Gastrointestinal:  Negative for abdominal pain, diarrhea and nausea.  Genitourinary:  Negative for dysuria, genital sores and penile discharge.  Musculoskeletal:  Negative for joint swelling.  Skin:  Negative for color change and rash.  Neurological:  Negative for dizziness and headaches.  Hematological:  Negative for adenopathy.  Psychiatric/Behavioral:  Negative for sleep disturbance. The patient is not nervous/anxious.         Objective:    There were no vitals taken for this visit. Nursing note and vital signs reviewed.   Physical Exam Constitutional:      Appearance: Normal appearance. He is not ill-appearing.  HENT:     Head: Normocephalic.     Mouth/Throat:     Mouth: Mucous membranes are moist.     Pharynx: Oropharynx is clear.  Eyes:     General: No scleral icterus. Pulmonary:     Effort: Pulmonary effort is normal.  Musculoskeletal:  General: Normal range of motion.     Cervical back: Normal range of motion.  Skin:    Coloration: Skin is not jaundiced or pale.  Neurological:     Mental Status: He is alert and oriented to person, place, and time.  Psychiatric:        Mood and Affect: Mood normal.        Judgment: Judgment normal.         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

## 2023-08-26 NOTE — Progress Notes (Deleted)
  Cardiology Office Note:  .   Date:  08/26/2023  ID:  Rhonda Ayers, DOB Apr 01, 1981, MRN [id number] PCP: Patient, No Pcp Per  [facility] Providers Cardiologist:  None { Click to update primary MD,subspecialty MD or APP then REFRESH:1}   History of Present Illness: .   Rhonda Ayers is a 42 y.o. adult with chest pain , HIV   ROS: ***  Studies Reviewed: .        *** Risk Assessment/Calculations:   {Does this patient have ATRIAL FIBRILLATION?:[phone]} No BP recorded.  {Refresh Note OR Click here to enter BP  :1}***       Physical Exam:   VS:  There were no vitals taken for this visit.   Wt Readings from Last 3 Encounters:  07/08/23 245 lb (111.1 kg)  07/01/23 252 lb (114.3 kg)  06/08/23 243 lb (110.2 kg)    GEN: Well nourished, well developed in no acute distress NECK: No JVD; No carotid bruits CARDIAC: ***RRR, no murmurs, rubs, gallops RESPIRATORY:  Clear to auscultation without rales, wheezing or rhonchi  ABDOMEN: Soft, non-tender, non-distended EXTREMITIES:  No edema; No deformity   ASSESSMENT AND PLAN: .   ***    {Are you ordering a CV Procedure (e.g. stress test, cath, DCCV, TEE, etc)?   Press F2        :[id number]}  Dispo: ***  Signed, Kristeen Miss, MD

## 2023-08-27 ENCOUNTER — Other Ambulatory Visit: Payer: Self-pay | Admitting: Infectious Diseases

## 2023-08-27 ENCOUNTER — Ambulatory Visit: Payer: Medicaid Other | Admitting: Internal Medicine

## 2023-08-27 ENCOUNTER — Ambulatory Visit: Payer: Medicaid Other | Attending: Cardiovascular Disease | Admitting: Cardiovascular Disease

## 2023-08-27 NOTE — Telephone Encounter (Signed)
Appt 11/14.

## 2023-08-27 NOTE — Progress Notes (Deleted)
Name: Rhonda Ayers  MRN/ DOB: [id number], 10-30-1980    Age/ Sex: 42 y.o., adult     PCP: Patient, No Pcp Per   Reason for Endocrinology Evaluation: Gender Dysphoria      Initial Endocrinology Clinic Visit: 09/21/2021    PATIENT IDENTIFIER: Rhonda Ayers is a 42 y.o., adult with a past medical history of HIV, gender dysphoria. He has followed with [facility] since 09/21/2021 for consultative assistance with management of his gender dysphoria.   HISTORICAL SUMMARY: The patient was diagnosed with gender dysphoria and has been on gender affirming therapy since 2020.  Estrogen in 2022  No h/o DVT, dyslipidemia, liver disease, kidney disease, migraine, sleep apnea, heart disease, CVA, acne, diabetes, HTN, epilepsy, gallbladder disease, blood disorders, osteoporosis, or cancer.  SUBJECTIVE:    Today (08/27/2023):  Rhonda Ayers is here for a follow-up on gender dysphoria.  No Personal thromboembolic  events  FH of clotting disorder Denies acne  Facial hair is decreasing   Pt has noted with weight gain  Has noted increased breast tissue  No breast cancer in family  Denies nausea or diarrhea   Medications Estradiol valuate 20 mg (1 mL) monthly   Spironolactone 50 mg, 4 tabs daily     HISTORY:  Past Medical History:  Past Medical History:  Diagnosis Date   Asthma    Carpal tunnel syndrome 07/03/2016   Depression 07/03/2016   Gonorrhea 08/31/2020   Hand laceration 09/01/2018   Health care maintenance 01/28/2017   Health care maintenance 01/28/2017   HIV infection (HCC)    Homelessness 09/01/2018   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 His smoking use included cigarettes. He has never used smokeless tobacco. He reports that he does not currently use alcohol. He reports current drug use. Drug: Marijuana. Family History:  Family History  Problem Relation Age of Onset   Diabetes Mother      HOME MEDICATIONS: Allergies as of 08/27/2023       Reactions   Tramadol Other (See Comments)   "shakes"   Tylenol [acetaminophen]         Medication List        Accurate as of August 27, 2023 12:54 PM. If you have any questions, ask your nurse or doctor.          Dovato 50-300 MG tablet Generic drug: dolutegravir-lamiVUDine Take 1 tablet by mouth daily.   estradiol valerate 20 MG/ML injection Commonly known as: DELESTROGEN Inject 20 mg into the muscle every 28 (twenty-eight) days.   Luer Lock Safety Syringes 22G X 1" 3 ML Misc Generic drug: SYRINGE-NEEDLE (DISP) 3 ML 1 Device by Does not apply route every 30 (thirty) days.   methocarbamol 500 MG tablet Commonly known as: ROBAXIN Take 1 tablet (500 mg total) by mouth 2 (two) times daily.   spironolactone 50 MG tablet Commonly known as: Aldactone Take 1 tablet (50 mg total) by mouth daily.          OBJECTIVE:   PHYSICAL EXAM:  VS: There were no vitals taken for this visit.   EXAM: General: Pt appears well and is in NAD  Eyes: External eye exam normal without stare, lid lag or exophthalmos.  EOM intact.    Neck: General: Supple without adenopathy. Thyroid: Thyroid size normal.  No goiter or nodules appreciated. No thyroid bruit.  Lungs: Clear with good BS bilat with no rales, rhonchi, or wheezes  Breast: Noted with PSUEDOgynecomastia  at this time   Heart: Auscultation: RRR.  Abdomen: Normoactive bowel sounds, soft, nontender, without masses or organomegaly palpable  Extremities:  BL LE: No pretibial  edema normal ROM and strength.  Mental Status: Judgment, insight: Intact Orientation: Oriented to time, place, and person Mood and affect: No depression, anxiety, or agitation     DATA REVIEWED:   Latest Reference Range & Units 07/27/22 11:55  Sodium 135 - 145 mEq/L 132 (L)  Potassium 3.5 - 5.1 mEq/L 4.1  Chloride 96 - 112 mEq/L 99  CO2 19 - 32 mEq/L 25  Glucose 70 - 99 mg/dL 528 (H)  BUN 6 - 23 mg/dL 12  Creatinine 4.13 - 2.44 mg/dL 0.10  Calcium 8.4 - 27.2 mg/dL 53.6  GFR >64.40 mL/min 66.97     Latest Reference Range & Units 07/27/22 11:55  Prolactin ng/mL 17.3  Glucose 70 - 99 mg/dL 347 (H)  Estradiol pg/mL 445 (H)  Testosterone 15.00 - 40.00 ng/dL 42.59 (H)     Old records , labs and images have been reviewed.   ASSESSMENT / PLAN / RECOMMENDATIONS:   Gender Dysphoria :   -Estrogen level continues to be above goal <200 pg/ml  -Testosterone level at goal -We discussed increased risk of thromboembolic events as well as breast cancer with estrogen intake -I will reduce estrogen level as below -Patient is questioning if she can preserve sperm for future use, unfortunately the patient has been on spironolactone with low testosterone for 3 years, I did explain to the patient that I am unable to say for certain but the patient will have to be of spironolactone and may need to follow-up with an infertility endocrinologist if this is needed in the future  Medications   Decrease estradiol valuate 20mg /mL to 20 mg (1 mL) monthly Continue spironolactone 50 mg 4 tabs daily  Follow-up in 6 months  Signed electronically by: Lyndle Herrlich, MD  [facility]  [facility] [address] [place], [place] [address] Phone: [phone] FAX: [phone]      CC: Patient, No Pcp Per No address on file Phone: None  Fax: None   Return to Endocrinology clinic as below: Future Appointments  Date Time Provider Department Center   08/27/2023  2:20 PM Seth Higginbotham, Konrad Dolores, MD [facility] None  08/27/2023  2:40 PM Nahser, Deloris Ping, MD [facility]  08/29/2023  4:00 PM Blanchard Kelch, NP [facility]  08/29/2023  4:30 PM [facility] FINANCIAL COUNSELOR [facility]

## 2023-08-29 ENCOUNTER — Ambulatory Visit: Payer: Medicaid Other

## 2023-08-29 ENCOUNTER — Ambulatory Visit: Payer: Self-pay | Admitting: Infectious Diseases

## 2023-08-30 ENCOUNTER — Other Ambulatory Visit: Payer: Self-pay

## 2023-08-30 DIAGNOSIS — B20 Human immunodeficiency virus [HIV] disease: Secondary | ICD-10-CM

## 2023-08-30 DIAGNOSIS — Z113 Encounter for screening for infections with a predominantly sexual mode of transmission: Secondary | ICD-10-CM

## 2023-08-30 DIAGNOSIS — Z79899 Other long term (current) drug therapy: Secondary | ICD-10-CM

## 2023-09-02 ENCOUNTER — Other Ambulatory Visit: Payer: Self-pay

## 2023-09-02 DIAGNOSIS — B20 Human immunodeficiency virus [HIV] disease: Secondary | ICD-10-CM

## 2023-09-02 DIAGNOSIS — Z79899 Other long term (current) drug therapy: Secondary | ICD-10-CM

## 2023-09-02 DIAGNOSIS — Z113 Encounter for screening for infections with a predominantly sexual mode of transmission: Secondary | ICD-10-CM

## 2023-09-03 LAB — URINE CYTOLOGY ANCILLARY ONLY
Chlamydia: NEGATIVE
Comment: NEGATIVE
Comment: NORMAL
Neisseria Gonorrhea: NEGATIVE

## 2023-09-04 LAB — LIPID PANEL
Cholesterol: 256 mg/dL — ABNORMAL HIGH (ref <200)
HDL: 37 mg/dL — ABNORMAL LOW (ref >=50)
LDL Cholesterol (Calc): 180 mg/dL — ABNORMAL HIGH
Non-HDL Cholesterol (Calc): 219 mg/dL — ABNORMAL HIGH (ref <130)
Total CHOL/HDL Ratio: 6.9 (calc) — ABNORMAL HIGH (ref <5.0)
Triglycerides: 230 mg/dL — ABNORMAL HIGH (ref <150)

## 2023-09-04 LAB — COMPLETE METABOLIC PANEL WITH GFR
AG Ratio: 1.5 (calc) (ref 1.0–2.5)
ALT: 40 U/L — ABNORMAL HIGH (ref 6–29)
AST: 61 U/L — ABNORMAL HIGH (ref 10–30)
Albumin: 4.5 g/dL (ref 3.6–5.1)
Alkaline phosphatase (APISO): 66 U/L (ref 31–125)
BUN/Creatinine Ratio: 14 (calc) (ref 6–22)
BUN: 16 mg/dL (ref 7–25)
CO2: 26 mmol/L (ref 20–32)
Calcium: 10.2 mg/dL (ref 8.6–10.2)
Chloride: 105 mmol/L (ref 98–110)
Creat: 1.13 mg/dL — ABNORMAL HIGH (ref 0.50–0.99)
Globulin: 3.1 g/dL (ref 1.9–3.7)
Glucose, Bld: 109 mg/dL — ABNORMAL HIGH (ref 65–99)
Potassium: 4.5 mmol/L (ref 3.5–5.3)
Sodium: 137 mmol/L (ref 135–146)
Total Bilirubin: 0.4 mg/dL (ref 0.2–1.2)
Total Protein: 7.6 g/dL (ref 6.1–8.1)
eGFR: 63 mL/min/{1.73_m2} (ref >=60)

## 2023-09-04 LAB — T PALLIDUM AB: T Pallidum Abs: POSITIVE — AB

## 2023-09-04 LAB — CBC WITH DIFFERENTIAL/PLATELET
Absolute Lymphocytes: 2480 {cells}/uL (ref 850–3900)
Absolute Monocytes: 477 {cells}/uL (ref 200–950)
Basophils Absolute: 50 {cells}/uL (ref 0–200)
Basophils Relative: 1.1 %
Eosinophils Absolute: 248 {cells}/uL (ref 15–500)
Eosinophils Relative: 5.5 %
HCT: 52 % — ABNORMAL HIGH (ref 35.0–45.0)
Hemoglobin: 17.5 g/dL — ABNORMAL HIGH (ref 11.7–15.5)
MCH: 30.8 pg (ref 27.0–33.0)
MCHC: 33.7 g/dL (ref 32.0–36.0)
MCV: 91.5 fL (ref 80.0–100.0)
MPV: 10.2 fL (ref 7.5–12.5)
Monocytes Relative: 10.6 %
Neutro Abs: 1247 {cells}/uL — ABNORMAL LOW (ref 1500–7800)
Neutrophils Relative %: 27.7 %
Platelets: 246 10*3/uL (ref 140–400)
RBC: 5.68 10*6/uL — ABNORMAL HIGH (ref 3.80–5.10)
RDW: 13 % (ref 11.0–15.0)
Total Lymphocyte: 55.1 %
WBC: 4.5 10*3/uL (ref 3.8–10.8)

## 2023-09-04 LAB — HIV-1 RNA QUANT-NO REFLEX-BLD
HIV 1 RNA Quant: 20 {copies}/mL — ABNORMAL HIGH
HIV-1 RNA Quant, Log: 1.31 {Log_copies}/mL — ABNORMAL HIGH

## 2023-09-04 LAB — T-HELPER CELL (CD4) - (RCID CLINIC ONLY)
CD4 % Helper T Cell: 34 % (ref 33–65)
CD4 T Cell Abs: 735 /uL (ref 400–1790)

## 2023-09-04 LAB — RPR: RPR Ser Ql: REACTIVE — AB

## 2023-09-04 LAB — RPR TITER: RPR Titer: 1:8 {titer} — ABNORMAL HIGH

## 2023-09-19 ENCOUNTER — Other Ambulatory Visit: Payer: Self-pay | Admitting: Infectious Diseases

## 2023-09-20 ENCOUNTER — Telehealth: Payer: Self-pay

## 2023-09-20 NOTE — Telephone Encounter (Signed)
[facility] pharmacy is requesting refill on the Estradiol. Patient has not been seen since 10/23 and last appt he was a no show.  Would you like to fill

## 2023-09-24 ENCOUNTER — Other Ambulatory Visit: Payer: Self-pay

## 2023-09-24 DIAGNOSIS — B20 Human immunodeficiency virus [HIV] disease: Secondary | ICD-10-CM

## 2023-09-24 MED ORDER — DOVATO 50-300 MG PO TABS: 1.0000 | ORAL_TABLET | Freq: Every day | ORAL | 4 refills | Status: DC

## 2023-09-25 ENCOUNTER — Ambulatory Visit: Payer: Medicaid Other | Admitting: Infectious Diseases

## 2023-10-01 ENCOUNTER — Ambulatory Visit: Payer: Medicaid Other | Admitting: Infectious Diseases

## 2023-11-08 ENCOUNTER — Other Ambulatory Visit: Payer: Self-pay

## 2023-11-08 ENCOUNTER — Encounter (HOSPITAL_COMMUNITY): Payer: Self-pay | Admitting: Emergency Medicine

## 2023-11-08 ENCOUNTER — Emergency Department (HOSPITAL_COMMUNITY)
Admission: EM | Admit: 2023-11-08 | Discharge: 2023-11-08 | Discharge status: Against Medical Advice | Payer: Self-pay | Attending: Emergency Medicine | Admitting: Emergency Medicine

## 2023-11-08 DIAGNOSIS — Z5321 Procedure and treatment not carried out due to patient leaving prior to being seen by health care provider: Secondary | ICD-10-CM | POA: Insufficient documentation

## 2023-11-08 DIAGNOSIS — R0789 Other chest pain: Secondary | ICD-10-CM | POA: Insufficient documentation

## 2023-11-08 LAB — BASIC METABOLIC PANEL
Anion gap: 10 (ref 5–15)
BUN: 12 mg/dL (ref 6–20)
CO2: 24 mmol/L (ref 22–32)
Calcium: 9.3 mg/dL (ref 8.9–10.3)
Chloride: 101 mmol/L (ref 98–111)
Creatinine, Ser: 1.06 mg/dL — ABNORMAL HIGH (ref 0.44–1.00)
GFR, Estimated: >60 mL/min (ref >60)
Glucose, Bld: 134 mg/dL — ABNORMAL HIGH (ref 70–99)
Potassium: 4.4 mmol/L (ref 3.5–5.1)
Sodium: 135 mmol/L (ref 135–145)

## 2023-11-08 LAB — CBC
HCT: 52.5 % — ABNORMAL HIGH (ref 36.0–46.0)
Hemoglobin: 17.8 g/dL — ABNORMAL HIGH (ref 12.0–15.0)
MCH: 31.5 pg (ref 26.0–34.0)
MCHC: 33.9 g/dL (ref 30.0–36.0)
MCV: 92.9 fL (ref 80.0–100.0)
Platelets: 276 10*3/uL (ref 150–400)
RBC: 5.65 MIL/uL — ABNORMAL HIGH (ref 3.87–5.11)
RDW: 13.5 % (ref 11.5–15.5)
WBC: 4.9 10*3/uL (ref 4.0–10.5)
nRBC: 0 % (ref 0.0–0.2)

## 2023-11-08 LAB — TROPONIN I (HIGH SENSITIVITY): Troponin I (High Sensitivity): 7 ng/L (ref <18)

## 2023-11-08 NOTE — ED Notes (Signed)
Pt called x 2 by EMT first; unable to locate

## 2023-11-08 NOTE — ED Triage Notes (Signed)
Patient c/o chest tightness this afternoon. Patinet report he felt his HR is running high at times. Patient denies N/V.

## 2023-11-11 ENCOUNTER — Emergency Department (HOSPITAL_COMMUNITY)
Admission: EM | Admit: 2023-11-11 | Discharge: 2023-11-11 | Disposition: A | Discharge status: Home / Self Care | Payer: Self-pay

## 2023-11-11 ENCOUNTER — Other Ambulatory Visit: Payer: Self-pay

## 2023-11-11 ENCOUNTER — Encounter (HOSPITAL_COMMUNITY): Payer: Self-pay | Admitting: Emergency Medicine

## 2023-11-11 DIAGNOSIS — E119 Type 2 diabetes mellitus without complications: Secondary | ICD-10-CM | POA: Insufficient documentation

## 2023-11-11 DIAGNOSIS — R002 Palpitations: Secondary | ICD-10-CM | POA: Insufficient documentation

## 2023-11-11 DIAGNOSIS — Z21 Asymptomatic human immunodeficiency virus [HIV] infection status: Secondary | ICD-10-CM | POA: Insufficient documentation

## 2023-11-11 DIAGNOSIS — R079 Chest pain, unspecified: Secondary | ICD-10-CM | POA: Insufficient documentation

## 2023-11-11 NOTE — ED Provider Notes (Signed)
[place] EMERGENCY DEPARTMENT AT [facility] Provider Note   CSN: [id number] Arrival date & time: 11/11/23  1055     History  No chief complaint on file.   Rhonda Ayers is a 43 y.o. adult with past medical history of HIV, diabetes presenting to emergency room with request of having work note, had palpitations 3 days ago, last a short period of time and then resolved.. Reports that he went to a [facility] emergency room and had labs drawn at that time.  He left without being seen and he left without ever seeing provider or hearing results of lab work, EKG or imaging.  Patient reports he does not have any chest pain, no shortness of breath, no abdominal pain, no nausea vomiting diarrhea.  He is otherwise been feeling well.  He reports he has follow-up with primary care.  He does not want repeat labs or imaging done today. HPI     Home Medications Prior to Admission medications   Medication Sig Start Date End Date Taking? Authorizing Provider  dolutegravir-lamiVUDine (DOVATO) 50-300 MG tablet Take 1 tablet by mouth daily. 09/24/23   Blanchard Kelch, NP  estradiol valerate (DELESTROGEN) 20 MG/ML injection Inject 20 mg into the muscle every 28 (twenty-eight) days. Patient not taking: Reported on 07/01/2023 06/03/23   [provider]  methocarbamol (ROBAXIN) 500 MG tablet Take 1 tablet (500 mg total) by mouth 2 (two) times daily. Patient not taking: Reported on 07/01/2023 06/08/23   Darrick Grinder, PA-C  spironolactone (ALDACTONE) 50 MG tablet Take 1 tablet (50 mg total) by mouth daily. 08/20/23   Shamleffer, Konrad Dolores, MD  SYRINGE-NEEDLE, DISP, 3 ML (LUER LOCK SAFETY SYRINGES) 22G X 1" 3 ML MISC 1 Device by Does not apply route every 30 (thirty) days. Patient not taking: Reported on 12/17/2022 07/30/22   Shamleffer, Konrad Dolores, MD      Allergies    Tramadol and Tylenol [acetaminophen]    Review of Systems   Review of Systems  Constitutional:  Positive for  activity change.    Physical Exam Updated Vital Signs BP (!) 137/90 (BP Location: Right Arm)   Pulse (!) 101   Temp 98.2 F (36.8 C) (Oral)   Resp 16   Wt 111 kg   SpO2 91%   BMI 38.33 kg/m  Physical Exam Vitals and nursing note reviewed.  Constitutional:      General: He is not in acute distress.    Appearance: He is not toxic-appearing.  HENT:     Head: Normocephalic and atraumatic.  Eyes:     General: No scleral icterus.    Conjunctiva/sclera: Conjunctivae normal.  Cardiovascular:     Rate and Rhythm: Normal rate and regular rhythm.     Pulses: Normal pulses.     Heart sounds: Normal heart sounds.  Pulmonary:     Effort: Pulmonary effort is normal. No respiratory distress.     Breath sounds: Normal breath sounds.  Abdominal:     General: Abdomen is flat. Bowel sounds are normal.     Palpations: Abdomen is soft.     Tenderness: There is no abdominal tenderness.  Musculoskeletal:     Right lower leg: No edema.     Left lower leg: No edema.  Skin:    General: Skin is warm and dry.     Findings: No lesion.  Neurological:     General: No focal deficit present.     Mental Status: He is alert and oriented  to person, place, and time. Mental status is at baseline.     ED Results / Procedures / Treatments   Labs (all labs ordered are listed, but only abnormal results are displayed) Labs Reviewed - No data to display  EKG None  Radiology No results found.  Procedures Procedures    Medications Ordered in ED Medications - No data to display  ED Course/ Medical Decision Making/ A&P                                 Medical Decision Making  Rhonda Ayers 43 y.o. presented today for chest pain. Working DDx that I considered at this time includes, but not limited to, ACS, GERD, pe, pna, aortic dissection, pneumothorax, MSK path, anemia, esophageal rupture, CHF exacerbation, valvular disorder, myocarditis, pericarditis, endocarditis, pericardial  effusion/cardiac tamponade, pulmonary edema, gastritis/PUD, esophagitis.  R/o Dx: These are considered less likely due to history of present illness and physical exam findings.   Review of prior external notes: Reviewed labs drawn 11/08/2023  Unique Tests and My Interpretation:  Offered to repeat EKG, labs and imaging, patient declined.  Patient reports symptoms have resolved and he would just like work note to return   Problem List / ED Course / Critical interventions / Medication management  With past medical history of HIV, syphilis taking request for a work note.  Patient reports he just needs a note to return to work.  Upon speaking to patient he is alert oriented answering questions appropriately with no slurred speech.  He has reassuring vital signs here.  No hypoxia and no fever.  Offered to obtain basic labs, EKG and imaging however patient declined.  In presentation, doubt this is secondary to PE he has no history of PE or DVT and no signs of DVT on exam.  Doubt ACS with 2 negative troponins and presentation. No sign of fluid overload.  Patient is feeling his symptoms have resolved we will have him follow-up with primary care.  Discussed obtaining ZIO monitor and return precautions.  Patients vitals assessed. Upon arrival patient is  hemodynamically stable.  I have reviewed the patients home medicines and have made adjustments as needed     Plan:  F/u w/ PCP to ensure resolution of sx.  Patient was given return precautions. Patient stable for discharge at this time.  Patient educated on sx/ dx and verbalized understanding of plan.  Will return to ER w/ new or worsening sx.          Final Clinical Impression(s) / ED Diagnoses Final diagnoses:  Palpitations    Rx / DC Orders ED Discharge Orders     None         Smitty Knudsen, PA-C 11/11/23 1227    Coral Spikes, DO 11/11/23 1536

## 2023-11-11 NOTE — ED Triage Notes (Signed)
Pt was seen at [facility], complaint resolved but needs a work note to return to work.

## 2023-11-11 NOTE — Discharge Instructions (Addendum)
You are seen in emergency room today for palpitations.  Your palpitations occurred 3 days ago and you have had no reoccurrence.  Did recommend following up with primary care.  Return to emergency room for new or worsening symptoms.  You are cleared to return to work.

## 2023-11-14 ENCOUNTER — Ambulatory Visit: Payer: Medicaid Other | Attending: Cardiology | Admitting: Cardiology

## 2023-11-15 ENCOUNTER — Encounter (HOSPITAL_COMMUNITY): Payer: Self-pay

## 2023-11-15 ENCOUNTER — Other Ambulatory Visit: Payer: Self-pay

## 2023-11-15 ENCOUNTER — Emergency Department (HOSPITAL_COMMUNITY): Payer: Self-pay

## 2023-11-15 ENCOUNTER — Emergency Department (HOSPITAL_COMMUNITY)
Admission: EM | Admit: 2023-11-15 | Discharge: 2023-11-15 | Disposition: A | Discharge status: Home / Self Care | Payer: Self-pay | Attending: Emergency Medicine | Admitting: Emergency Medicine

## 2023-11-15 ENCOUNTER — Encounter: Payer: Self-pay | Admitting: Cardiology

## 2023-11-15 DIAGNOSIS — R112 Nausea with vomiting, unspecified: Secondary | ICD-10-CM

## 2023-11-15 DIAGNOSIS — J111 Influenza due to unidentified influenza virus with other respiratory manifestations: Secondary | ICD-10-CM | POA: Diagnosis not present

## 2023-11-15 DIAGNOSIS — Z20822 Contact with and (suspected) exposure to covid-19: Secondary | ICD-10-CM | POA: Insufficient documentation

## 2023-11-15 DIAGNOSIS — R059 Cough, unspecified: Secondary | ICD-10-CM | POA: Diagnosis present

## 2023-11-15 DIAGNOSIS — Z21 Asymptomatic human immunodeficiency virus [HIV] infection status: Secondary | ICD-10-CM | POA: Diagnosis not present

## 2023-11-15 LAB — CBC WITH DIFFERENTIAL/PLATELET
Abs Immature Granulocytes: 0.02 10*3/uL (ref 0.00–0.07)
Basophils Absolute: 0.1 10*3/uL (ref 0.0–0.1)
Basophils Relative: 1 %
Eosinophils Absolute: 0 10*3/uL (ref 0.0–0.5)
Eosinophils Relative: 0 %
HCT: 53.3 % — ABNORMAL HIGH (ref 36.0–46.0)
Hemoglobin: 18.1 g/dL — ABNORMAL HIGH (ref 12.0–15.0)
Immature Granulocytes: 0 %
Lymphocytes Relative: 26 %
Lymphs Abs: 1.6 10*3/uL (ref 0.7–4.0)
MCH: 31.6 pg (ref 26.0–34.0)
MCHC: 34 g/dL (ref 30.0–36.0)
MCV: 93 fL (ref 80.0–100.0)
Monocytes Absolute: 0.9 10*3/uL (ref 0.1–1.0)
Monocytes Relative: 14 %
Neutro Abs: 3.5 10*3/uL (ref 1.7–7.7)
Neutrophils Relative %: 59 %
Platelets: 177 10*3/uL (ref 150–400)
RBC: 5.73 MIL/uL — ABNORMAL HIGH (ref 3.87–5.11)
RDW: 13.7 % (ref 11.5–15.5)
WBC: 6 10*3/uL (ref 4.0–10.5)
nRBC: 0 % (ref 0.0–0.2)

## 2023-11-15 LAB — COMPREHENSIVE METABOLIC PANEL
ALT: 66 U/L — ABNORMAL HIGH (ref 0–44)
AST: 65 U/L — ABNORMAL HIGH (ref 15–41)
Albumin: 4.5 g/dL (ref 3.5–5.0)
Alkaline Phosphatase: 71 U/L (ref 38–126)
Anion gap: 13 (ref 5–15)
BUN: 13 mg/dL (ref 6–20)
CO2: 21 mmol/L — ABNORMAL LOW (ref 22–32)
Calcium: 9.4 mg/dL (ref 8.9–10.3)
Chloride: 100 mmol/L (ref 98–111)
Creatinine, Ser: 1.18 mg/dL — ABNORMAL HIGH (ref 0.44–1.00)
GFR, Estimated: 60 mL/min — ABNORMAL LOW (ref >60)
Glucose, Bld: 112 mg/dL — ABNORMAL HIGH (ref 70–99)
Potassium: 4.1 mmol/L (ref 3.5–5.1)
Sodium: 134 mmol/L — ABNORMAL LOW (ref 135–145)
Total Bilirubin: 0.9 mg/dL (ref 0.0–1.2)
Total Protein: 8.1 g/dL (ref 6.5–8.1)

## 2023-11-15 LAB — RESP PANEL BY RT-PCR (RSV, FLU A&B, COVID)  RVPGX2
Influenza A by PCR: POSITIVE — AB
Influenza B by PCR: NEGATIVE
Resp Syncytial Virus by PCR: NEGATIVE
SARS Coronavirus 2 by RT PCR: NEGATIVE

## 2023-11-15 LAB — LIPASE, BLOOD: Lipase: 32 U/L (ref 11–51)

## 2023-11-15 MED ORDER — ONDANSETRON 4 MG PO TBDP
4.0000 mg | ORAL_TABLET | Freq: Once | ORAL | Status: AC
  Administered 2023-11-15: 4 mg via ORAL
  Filled 2023-11-15: qty 1

## 2023-11-15 MED ORDER — BENZONATATE 100 MG PO CAPS: 100.0000 mg | ORAL_CAPSULE | Freq: Three times a day (TID) | ORAL | 0 refills | Status: DC

## 2023-11-15 MED ORDER — ONDANSETRON 4 MG PO TBDP: 4.0000 mg | ORAL_TABLET | Freq: Three times a day (TID) | ORAL | 0 refills | Status: AC | PRN

## 2023-11-15 MED ORDER — NAPROXEN 500 MG PO TABS: 500.0000 mg | ORAL_TABLET | Freq: Two times a day (BID) | ORAL | 0 refills | Status: DC

## 2023-11-15 NOTE — Discharge Instructions (Signed)
Your flu test was positive this is likely the cause of your symptoms  I written for a few medications to help  Make sure to follow-up outpatient, return for new or worsening symptoms

## 2023-11-15 NOTE — ED Provider Notes (Cosign Needed)
[place] EMERGENCY DEPARTMENT AT [facility] Provider Note   CSN: [id number] Arrival date & time: 11/15/23  1709     History  Chief Complaint  Patient presents with   Emesis     Rhonda Ayers is a 43 y.o. adult history of HIV, syphilis here for evaluation of feeling unwell.  He has flulike symptoms over the last 2 days.  Nausea vomiting, diarrhea, cough, congestion, fatigue.  He is compliant with ID and his HIV medications.  No abdominal pain, chest pain, shortness of breath.  HPI     Home Medications Prior to Admission medications   Medication Sig Start Date End Date Taking? Authorizing Provider  benzonatate (TESSALON) 100 MG capsule Take 1 capsule (100 mg total) by mouth every 8 (eight) hours. 11/15/23  Yes Willene Holian A, PA-C  naproxen (NAPROSYN) 500 MG tablet Take 1 tablet (500 mg total) by mouth 2 (two) times daily. 11/15/23  Yes Uno Esau A, PA-C  ondansetron (ZOFRAN-ODT) 4 MG disintegrating tablet Take 1 tablet (4 mg total) by mouth every 8 (eight) hours as needed. 11/15/23  Yes Kashius Dominic A, PA-C  dolutegravir-lamiVUDine (DOVATO) 50-300 MG tablet Take 1 tablet by mouth daily. 09/24/23   Blanchard Kelch, NP  estradiol valerate (DELESTROGEN) 20 MG/ML injection Inject 20 mg into the muscle every 28 (twenty-eight) days. Patient not taking: Reported on 07/01/2023 06/03/23   [provider]  methocarbamol (ROBAXIN) 500 MG tablet Take 1 tablet (500 mg total) by mouth 2 (two) times daily. Patient not taking: Reported on 07/01/2023 06/08/23   Darrick Grinder, PA-C  spironolactone (ALDACTONE) 50 MG tablet Take 1 tablet (50 mg total) by mouth daily. 08/20/23   Shamleffer, Konrad Dolores, MD  SYRINGE-NEEDLE, DISP, 3 ML (LUER LOCK SAFETY SYRINGES) 22G X 1" 3 ML MISC 1 Device by Does not apply route every 30 (thirty) days. Patient not taking: Reported on 12/17/2022 07/30/22   Shamleffer, Konrad Dolores, MD      Allergies    Tramadol and Tylenol  [acetaminophen]    Review of Systems   Review of Systems  Constitutional:  Positive for activity change, appetite change, chills and fatigue.  HENT:  Positive for congestion and rhinorrhea. Negative for sore throat.   Respiratory:  Positive for cough. Negative for shortness of breath.   Cardiovascular: Negative.   Gastrointestinal:  Positive for diarrhea, nausea and vomiting. Negative for abdominal distention, abdominal pain, anal bleeding, blood in stool, constipation and rectal pain.  Genitourinary: Negative.   Musculoskeletal: Negative.   Skin: Negative.   Neurological: Negative.   All other systems reviewed and are negative.   Physical Exam Updated Vital Signs BP 124/88   Pulse (!) 114   Temp 98.9 F (37.2 C) (Oral)   Resp 18   Ht 5\' 7"  (1.702 m)   Wt 113.4 kg   SpO2 91%   BMI 39.16 kg/m  Physical Exam Vitals and nursing note reviewed.  Constitutional:      General: He is not in acute distress.    Appearance: He is well-developed. He is not ill-appearing, toxic-appearing or diaphoretic.  HENT:     Head: Normocephalic and atraumatic.     Nose: Congestion and rhinorrhea present.  Eyes:     Pupils: Pupils are equal, round, and reactive to light.  Cardiovascular:     Rate and Rhythm: Normal rate.     Pulses: Normal pulses.     Heart sounds: Normal heart sounds.  Pulmonary:     Effort:  Pulmonary effort is normal. No respiratory distress.     Breath sounds: Normal breath sounds. Rales: moist mucous membranes.  Abdominal:     General: Bowel sounds are normal. There is no distension.     Palpations: Abdomen is soft.     Tenderness: There is no abdominal tenderness. There is no right CVA tenderness, left CVA tenderness, guarding or rebound.  Musculoskeletal:        General: No swelling or deformity. Normal range of motion.     Cervical back: Normal range of motion.     Right lower leg: No edema.     Left lower leg: No edema.  Skin:    General: Skin is warm and dry.      Capillary Refill: Capillary refill takes less than 2 seconds.  Neurological:     General: No focal deficit present.     Mental Status: He is alert.  Psychiatric:        Mood and Affect: Mood normal.     ED Results / Procedures / Treatments   Labs (all labs ordered are listed, but only abnormal results are displayed) Labs Reviewed  RESP PANEL BY RT-PCR (RSV, FLU A&B, COVID)  RVPGX2 - Abnormal; Notable for the following components:      Result Value   Influenza A by PCR POSITIVE (*)    All other components within normal limits  CBC WITH DIFFERENTIAL/PLATELET - Abnormal; Notable for the following components:   RBC 5.73 (*)    Hemoglobin 18.1 (*)    HCT 53.3 (*)    All other components within normal limits  COMPREHENSIVE METABOLIC PANEL - Abnormal; Notable for the following components:   Sodium 134 (*)    CO2 21 (*)    Glucose, Bld 112 (*)    Creatinine, Ser 1.18 (*)    AST 65 (*)    ALT 66 (*)    GFR, Estimated 60 (*)    All other components within normal limits  LIPASE, BLOOD    EKG None  Radiology DG Chest 2 View Result Date: 11/15/2023 CLINICAL DATA:  Cough, shortness of breath, congestion and fatigue for the past 2 days. EXAM: CHEST - 2 VIEW COMPARISON:  04/27/2022. FINDINGS: Normal sized heart. Mild peribronchial thickening with minimal progression. No airspace consolidation or pleural fluid. Unremarkable bones. IMPRESSION: Mild bronchitic changes with minimal progression. Electronically Signed   By: Beckie Salts M.D.   On: 11/15/2023 18:58    Procedures Procedures    Medications Ordered in ED Medications  ondansetron (ZOFRAN-ODT) disintegrating tablet 4 mg (4 mg Oral Given 11/15/23 1748)    ED Course/ Medical Decision Making/ A&P    43 year old history of HIV, good follow-up with ID, medication compliant here for evaluation of URI symptoms over the last few days.  Roommate with similar symptoms.  On arrival he is afebrile, nonseptic, non-ill-appearing.   Will plan on labs, imaging and reassess  Labs and imaging personally viewed interpreted CBC without leukocytosis Metabolic panel sodium 134, creatinine 1.18, similar to prior, mild elevation LFTs, similar to prior Lipase 32 Chest x-ray with stable bronchitic changes Viral panel fluid positive  Discussed results with patient.  He was treated with Zofran here.  He is tolerating p.o. intake.  Discussed symptom management.  Will DC home with medications to help with symptoms.  Will have him follow-up, return for any worsening symptoms.  Low suspicion for superimposed bacterial infection  The patient has been appropriately medically screened and/or stabilized in the ED. I have  low suspicion for any other emergent medical condition which would require further screening, evaluation or treatment in the ED or require inpatient management.  Patient is hemodynamically stable and in no acute distress.  Patient able to ambulate in department prior to ED.  Evaluation does not show acute pathology that would require ongoing or additional emergent interventions while in the emergency department or further inpatient treatment.  I have discussed the diagnosis with the patient and answered all questions.  Pain is been managed while in the emergency department and patient has no further complaints prior to discharge.  Patient is comfortable with plan discussed in room and is stable for discharge at this time.  I have discussed strict return precautions for returning to the emergency department.  Patient was encouraged to follow-up with PCP/specialist refer to at discharge.                                Medical Decision Making Amount and/or Complexity of Data Reviewed External Data Reviewed: labs, radiology and notes. Labs: ordered. Decision-making details documented in ED Course. Radiology: ordered and independent interpretation performed. Decision-making details documented in ED Course.  Risk OTC  drugs. Prescription drug management. Decision regarding hospitalization. Diagnosis or treatment significantly limited by social determinants of health.          Final Clinical Impression(s) / ED Diagnoses Final diagnoses:  Influenza  Nausea and vomiting, unspecified vomiting type    Rx / DC Orders ED Discharge Orders          Ordered    naproxen (NAPROSYN) 500 MG tablet  2 times daily        11/15/23 2158    benzonatate (TESSALON) 100 MG capsule  Every 8 hours        11/15/23 2158    ondansetron (ZOFRAN-ODT) 4 MG disintegrating tablet  Every 8 hours PRN        11/15/23 2158              Carvell Hoeffner A, PA-C 11/15/23 2229

## 2023-11-15 NOTE — ED Triage Notes (Signed)
Patient reports nausea, vomiting, diarrhea, cough, congestion, and fatigue x 2 days.

## 2023-11-15 NOTE — ED Provider Triage Note (Signed)
Emergency Medicine Provider Triage Evaluation Note  Rhonda Ayers , a 43 y.o. adult  was evaluated in triage.  Pt complains of sob, emesis, cough. Hx of Hiv follows with ID. Some night sweats compliant with home meds. Multiple episode of emesis at home  Review of Systems  Positive: Sob, cough, emesis Negative:   Physical Exam  BP (!) 150/111 (BP Location: Right Arm)   Pulse (!) 106   Temp 98.6 F (37 C) (Oral)   Resp 18   Ht 5\' 7"  (1.702 m)   Wt 113.4 kg   SpO2 100%   BMI 39.16 kg/m  Gen:   Awake, no distress   Resp:  Normal effort  MSK:   Moves extremities without difficulty  Other:    Medical Decision Making  Medically screening exam initiated at 5:35 PM.  Appropriate orders placed.  Rhonda Ayers was informed that the remainder of the evaluation will be completed by another provider, this initial triage assessment does not replace that evaluation, and the importance of remaining in the ED until their evaluation is complete.  Flu like sx   Lauretta Sallas A, PA-C 11/15/23 1736

## 2023-11-27 ENCOUNTER — Ambulatory Visit: Payer: Medicaid Other | Admitting: Infectious Diseases

## 2023-12-18 ENCOUNTER — Other Ambulatory Visit (HOSPITAL_COMMUNITY): Payer: Self-pay

## 2023-12-18 ENCOUNTER — Telehealth: Payer: Self-pay

## 2023-12-18 ENCOUNTER — Ambulatory Visit: Payer: Self-pay | Admitting: Infectious Diseases

## 2023-12-18 NOTE — Telephone Encounter (Signed)
 Patient called unable to get Dovato.   States she was told her ADAP is expired but says she talked with someone at [facility] and they would be renewing for her.   Sandie Ano, RN

## 2023-12-19 NOTE — Telephone Encounter (Addendum)
 Patient called office stating he is on the way to office to pickup samples. Spoke with Artist and was told that since pt has not renewed his ADAP will not be able to receive samples. Pt has failed to meet with counselor multiple times and has failed to email paystubs. Relayed to patient in order to receive samples he will need to renew ADAP first.  Pt became agitated and states that the reason he did not renew was because staff failed to remind him to do this. Informed pt that its his responsibility to ensure his ADAP is up to date and again that he can come tomorrow to pickup samples after meeting with financial counselor.  Pt refuses to come in tomorrow. Call transferred to practice manager.  Juanita Laster, RMA

## 2023-12-20 ENCOUNTER — Other Ambulatory Visit: Payer: Self-pay | Admitting: Pharmacist

## 2023-12-20 MED ORDER — DOVATO 50-300 MG PO TABS: 1.0000 | ORAL_TABLET | Freq: Every day | ORAL | Status: AC

## 2023-12-20 NOTE — Progress Notes (Signed)
 Medication Samples have been provided to the patient.  Drug name: Dovato        Strength: 50/300 mg         Qty: 14  Tablets (1 bottles) LOT: [id number]   Exp.Date: 7/26  Dosing instructions: Take one tablet by mouth once daily  The patient has been instructed regarding the correct time, dose, and frequency of taking this medication, including desired effects and most common side effects.   Margarite Gouge, PharmD, CPP, BCIDP, AAHIVP Clinical Pharmacist Practitioner Infectious Diseases Clinical Pharmacist [facility]

## 2023-12-31 ENCOUNTER — Encounter (HOSPITAL_COMMUNITY): Payer: Self-pay

## 2023-12-31 ENCOUNTER — Emergency Department (HOSPITAL_COMMUNITY)

## 2023-12-31 ENCOUNTER — Emergency Department (HOSPITAL_COMMUNITY): Admission: EM | Admit: 2023-12-31 | Discharge: 2023-12-31 | Disposition: A | Discharge status: Home / Self Care

## 2023-12-31 DIAGNOSIS — M542 Cervicalgia: Secondary | ICD-10-CM | POA: Diagnosis present

## 2023-12-31 DIAGNOSIS — R079 Chest pain, unspecified: Secondary | ICD-10-CM | POA: Insufficient documentation

## 2023-12-31 DIAGNOSIS — Z21 Asymptomatic human immunodeficiency virus [HIV] infection status: Secondary | ICD-10-CM | POA: Diagnosis not present

## 2023-12-31 DIAGNOSIS — H538 Other visual disturbances: Secondary | ICD-10-CM | POA: Diagnosis not present

## 2023-12-31 LAB — BASIC METABOLIC PANEL
Anion gap: 8 (ref 5–15)
BUN: 13 mg/dL (ref 6–20)
CO2: 22 mmol/L (ref 22–32)
Calcium: 9 mg/dL (ref 8.9–10.3)
Chloride: 102 mmol/L (ref 98–111)
Creatinine, Ser: 1.04 mg/dL — ABNORMAL HIGH (ref 0.44–1.00)
GFR, Estimated: >60 mL/min (ref >60)
Glucose, Bld: 122 mg/dL — ABNORMAL HIGH (ref 70–99)
Potassium: 4.3 mmol/L (ref 3.5–5.1)
Sodium: 132 mmol/L — ABNORMAL LOW (ref 135–145)

## 2023-12-31 LAB — CBC
HCT: 52.5 % — ABNORMAL HIGH (ref 36.0–46.0)
Hemoglobin: 18.4 g/dL — ABNORMAL HIGH (ref 12.0–15.0)
MCH: 31.5 pg (ref 26.0–34.0)
MCHC: 35 g/dL (ref 30.0–36.0)
MCV: 89.9 fL (ref 80.0–100.0)
Platelets: 259 10*3/uL (ref 150–400)
RBC: 5.84 MIL/uL — ABNORMAL HIGH (ref 3.87–5.11)
RDW: 13.8 % (ref 11.5–15.5)
WBC: 5.7 10*3/uL (ref 4.0–10.5)
nRBC: 0.4 % — ABNORMAL HIGH (ref 0.0–0.2)

## 2023-12-31 LAB — TROPONIN I (HIGH SENSITIVITY)
Troponin I (High Sensitivity): 12 ng/L (ref <18)
Troponin I (High Sensitivity): 10 ng/L (ref <18)

## 2023-12-31 LAB — MAGNESIUM: Magnesium: 2 mg/dL (ref 1.7–2.4)

## 2023-12-31 LAB — TSH: TSH: 5.505 u[IU]/mL — ABNORMAL HIGH (ref 0.350–4.500)

## 2023-12-31 MED ORDER — IOHEXOL 350 MG/ML SOLN
75.0000 mL | Freq: Once | INTRAVENOUS | Status: AC | PRN
  Administered 2023-12-31: 75 mL via INTRAVENOUS

## 2023-12-31 MED ORDER — KETOROLAC TROMETHAMINE 10 MG PO TABS: 10.0000 mg | ORAL_TABLET | Freq: Four times a day (QID) | ORAL | 0 refills | Status: DC | PRN

## 2023-12-31 MED ORDER — KETOROLAC TROMETHAMINE 15 MG/ML IJ SOLN
15.0000 mg | Freq: Once | INTRAMUSCULAR | Status: AC
  Administered 2023-12-31: 15 mg via INTRAVENOUS
  Filled 2023-12-31: qty 1

## 2023-12-31 NOTE — ED Provider Triage Note (Signed)
 Emergency Medicine Provider Triage Evaluation Note  SONIYAH MCGLORY , a 43 y.o. adult  was evaluated in triage.  Pt complains of left chest pain, left neck pain, left headache, and left eye twitching, left hand twitching, and left arm numbness starting at 3 PM yesterday after smoking marijuana that did not seem normal.  Review of Systems  Positive: Left chest pain, left neck pain, left headache, left eye twitching, left arm numbness, left hand twitching.  Palpitations, fatigue. Negative: Fevers, chills, nausea, vomiting, constipation, diarrhea, urinary changes.  Denies leg pain or leg swelling.  Denies any right arm symptoms.  Denies dizziness, speech changes, or blurry vision.  Denies diplopia.  Denies facial droop.  Denies facial numbness.  Physical Exam  BP (!) 132/94 (BP Location: Right Arm)   Pulse 84   Temp 98.1 F (36.7 C) (Oral)   Resp 15   Ht 5\' 7"  (1.702 m)   Wt 113.4 kg   SpO2 96%   BMI 39.16 kg/m  Gen:   Awake, no distress   Resp:  Normal effort  MSK:   Moves extremities without difficulty  Other:  Pupils symmetric and reactive with normal extraocular's.  Symmetric smile.  Clear speech.  Patient had no pain with neck movement up and down but had some pain in the left neck when his head was turned to the right.  Has tenderness on his left chest wall going towards his left neck and head.  Intact sensation, strength, and pulses in arms and legs.  Symmetric smile.  Clear speech.  Normal finger-nose-finger testing.  No focal numbness on my exam now.  Medical Decision Making  Medically screening exam initiated at 12:15 PM.  Appropriate orders placed.  Ivan D Nicotra was informed that the remainder of the evaluation will be completed by another provider, this initial triage assessment does not replace that evaluation, and the importance of remaining in the ED until their evaluation is complete.  Farron D Rosiles is a 43 y.o. adult with a past medical history significant for HIV,  documentation of late syphilis, diabetes, and asthma who presents with multiple complaints starting at 3 PM yesterday.  According to patient, he smoked some marijuana that did not seem normal and then started having left chest discomfort.  He did have some coughing.  Reports start having pain in his left chest that goes into his left neck and into his left head.  He reports his left eye was twitching and his left hand was twitching as well.  He reports the pain is 9 out of 10.  He reports his left arm was numb for a while.  The numbness has improved but he still having the headache and pain symptoms.  Denies any falls or trauma.  Denies any acute shortness of breath.  He reports it was not blurry vision but more of a twitching in the left eye.  Denied diplopia.  He denies any nausea, vomiting, constipation, diarrhea.  Denies any abdominal pain or back pain.  Denies flank pain.  Denies leg pain or leg swelling.  No left leg symptoms.  On exam, patient had tenderness in his left chest going towards his left neck.  Left neck was also tender.  He had pain when he turns his head to the right causing pain the left neck but he did not have any pain with extension or flexion of the neck.  No carotid bruit on my exam.  No rash to suggest shingles.  Back and flanks  nontender.  Abdomen nontender.  No murmur.  Lung sounds were symmetric.  Symmetric smile.  Clear speech.  Pupils were symmetric and reactive with normal extraocular movements.  Intact sensation and strength in arms and legs.  Normal finger-nose-finger testing bilaterally.  Patient subjectively felt like he had numbness earlier but that has improved in the left arm.  Clinically I suspect patient may have had a coughing fit during this atypical marijuana and that may have caused some muscle skeletal pain in his left chest going to his left neck and head.  He was focally tender in this location suspicious for a chest wall or musculoskeletal discomfort.  However,  given the pain going from his chest to neck to head with transient neurologic complaints, it is reasonable to get CT imaging of the head and neck.  Will get CTA of the head and neck and will get chest x-ray.  Will get a UDS to look for other things that could have been the marijuana and will get troponins.  Will get screening labs.  As patient is not having any pain with flexion or extension I have less suspicion for meningitis at this time.  Patient is document history of syphilis and HIV is a concern however he is denying fevers or chills.  Have less suspicion for acute stroke or neurosyphilis or seizure at this time.  Will get workup started and will defer further workup or neuroimaging to team that will see him.       Remmie Bembenek, Canary Brim, MD 12/31/23 1220

## 2023-12-31 NOTE — ED Provider Notes (Signed)
 [place] EMERGENCY DEPARTMENT AT [facility] Provider Note   CSN: [id number] Arrival date & time: 12/31/23  1059     History  Chief Complaint  Patient presents with   Chest Pain   Blurred Vision    Rhonda Ayers is a 43 y.o. adult.  43 year old female with past medical history of HIV on Hart therapy with undetectable viral load and hypertension presenting to the emergency department today with pain in his left neck and chest.  Patient states this began last night after smoking some marijuana.  He states that it tasted abnormally to him.  He states he started having a coughing fit and then was having pain after.  He states that the pain has been coming and going.  He states he did have some blurred vision last night while he was smoking which has since resolved.  He denies any weakness but does report some tingling in his arms.  He states that this has improved as well.  He denies any hemoptysis.  Denies any fevers.  Denies any neck stiffness.  He states he is having pain mostly on the anterior left side of his neck.   Chest Pain      Home Medications Prior to Admission medications   Medication Sig Start Date End Date Taking? Authorizing Provider  ketorolac (TORADOL) 10 MG tablet Take 1 tablet (10 mg total) by mouth every 6 (six) hours as needed. 12/31/23  Yes Durwin Glaze, MD  benzonatate (TESSALON) 100 MG capsule Take 1 capsule (100 mg total) by mouth every 8 (eight) hours. 11/15/23   Henderly, Britni A, PA-C  dolutegravir-lamiVUDine (DOVATO) 50-300 MG tablet Take 1 tablet by mouth daily. 09/24/23   Blanchard Kelch, NP  dolutegravir-lamiVUDine (DOVATO) 50-300 MG tablet Take 1 tablet by mouth daily for 14 days. 12/19/23 01/02/24  Jennette Kettle, RPH-CPP  estradiol valerate (DELESTROGEN) 20 MG/ML injection Inject 20 mg into the muscle every 28 (twenty-eight) days. Patient not taking: Reported on 07/01/2023 06/03/23   [provider]  methocarbamol (ROBAXIN) 500  MG tablet Take 1 tablet (500 mg total) by mouth 2 (two) times daily. Patient not taking: Reported on 07/01/2023 06/08/23   Barrie Dunker B, PA-C  ondansetron (ZOFRAN-ODT) 4 MG disintegrating tablet Take 1 tablet (4 mg total) by mouth every 8 (eight) hours as needed. 11/15/23   Henderly, Britni A, PA-C  spironolactone (ALDACTONE) 50 MG tablet Take 1 tablet (50 mg total) by mouth daily. 08/20/23   Shamleffer, Konrad Dolores, MD  SYRINGE-NEEDLE, DISP, 3 ML (LUER LOCK SAFETY SYRINGES) 22G X 1" 3 ML MISC 1 Device by Does not apply route every 30 (thirty) days. Patient not taking: Reported on 12/17/2022 07/30/22   Shamleffer, Konrad Dolores, MD      Allergies    Tramadol and Tylenol [acetaminophen]    Review of Systems   Review of Systems  Cardiovascular:  Positive for chest pain.  All other systems reviewed and are negative.   Physical Exam Updated Vital Signs BP (!) 134/104   Pulse 83   Temp 98 F (36.7 C) (Oral)   Resp 16   Ht 5\' 7"  (1.702 m)   Wt 113.4 kg   SpO2 98%   BMI 39.16 kg/m  Physical Exam Vitals and nursing note reviewed.   Gen: NAD Eyes: PERRL, EOMI HEENT: no oropharyngeal swelling Neck: trachea midline, no meningismus, patient ports some tenderness over the anterior aspect of his neck, no midline tenderness noted Resp: clear to auscultation bilaterally  Card: RRR, no murmurs, rubs, or gallops Abd: nontender, nondistended Extremities: no calf tenderness, no edema Vascular: 2+ radial pulses bilaterally, 2+ DP pulses bilaterally Neuro: Cranial nerves intact, equal strength sensation throughout bilateral upper and lower extremities with no dysmetria on finger-to-nose testing Skin: no rashes Psyc: acting appropriately'  ED Results / Procedures / Treatments   Labs (all labs ordered are listed, but only abnormal results are displayed) Labs Reviewed  BASIC METABOLIC PANEL - Abnormal; Notable for the following components:      Result Value   Sodium 132 (*)    Glucose,  Bld 122 (*)    Creatinine, Ser 1.04 (*)    All other components within normal limits  CBC - Abnormal; Notable for the following components:   RBC 5.84 (*)    Hemoglobin 18.4 (*)    HCT 52.5 (*)    nRBC 0.4 (*)    All other components within normal limits  TSH - Abnormal; Notable for the following components:   TSH 5.505 (*)    All other components within normal limits  RESP PANEL BY RT-PCR (RSV, FLU A&B, COVID)  RVPGX2  MAGNESIUM  RAPID URINE DRUG SCREEN, HOSP PERFORMED  TROPONIN I (HIGH SENSITIVITY)  TROPONIN I (HIGH SENSITIVITY)    EKG None  Radiology CT ANGIO HEAD NECK W WO CM Result Date: 12/31/2023 CLINICAL DATA:  Left-sided chest pain extending into the neck and head. Numbness in left arm. EXAM: CT ANGIOGRAPHY HEAD AND NECK WITH AND WITHOUT CONTRAST TECHNIQUE: Multidetector CT imaging of the head and neck was performed using the standard protocol during bolus administration of intravenous contrast. Multiplanar CT image reconstructions and MIPs were obtained to evaluate the vascular anatomy. Carotid stenosis measurements (when applicable) are obtained utilizing NASCET criteria, using the distal internal carotid diameter as the denominator. RADIATION DOSE REDUCTION: This exam was performed according to the departmental dose-optimization program which includes automated exposure control, adjustment of the mA and/or kV according to patient size and/or use of iterative reconstruction technique. CONTRAST:  75mL OMNIPAQUE IOHEXOL 350 MG/ML SOLN COMPARISON:  CT head 01/18/2016 FINDINGS: CT HEAD FINDINGS Brain: No acute intracranial hemorrhage. No CT evidence of acute infarct. No edema, mass effect, or midline shift. The basilar cisterns are patent. Ventricles: Ventricles are normal in size and configuration. Vascular: No hyperdense vessel. Skull: No acute or aggressive finding. Sinuses/orbits: Orbits are symmetric. Mild-to-moderate mucosal thickening in the ethmoid sinuses. Secretions in the  left sphenoid sinus. Other: Mastoid air cells are clear. CTA NECK FINDINGS Aortic arch: Standard configuration of the aortic arch. Imaged portion shows no evidence of aneurysm or dissection. No significant stenosis of the major arch vessel origins. Pulmonary arteries: As permitted by contrast timing, there are no filling defects in the visualized pulmonary arteries. Subclavian arteries: The subclavian arteries are patent bilaterally. Right carotid system: No evidence of dissection, stenosis (50% or greater), or occlusion. Left carotid system: No evidence of dissection, stenosis (50% or greater), or occlusion. Vertebral arteries: Codominant. No evidence of dissection, stenosis (50% or greater), or occlusion. Skeleton: No acute or aggressive finding noted. Other neck: The visualized airway is patent. No cervical lymphadenopathy. Upper chest: Visualized lung apices are clear. Review of the MIP images confirms the above findings CTA HEAD FINDINGS ANTERIOR CIRCULATION: The intracranial ICAs are patent bilaterally. No significant stenosis, proximal occlusion, aneurysm, or vascular malformation. MCAs: The middle cerebral arteries are patent bilaterally. ACAs: Patent bilaterally. The A1 segment of the left ACA is diminutive. POSTERIOR CIRCULATION: No significant stenosis, proximal occlusion, aneurysm,  or vascular malformation. PCAs: Patent bilaterally.  Fetal origin of the left PCA. Pcomm: Visualized on the left. SCAs: The superior cerebellar arteries are patent bilaterally. Basilar artery: Patent AICAs: Patent PICAs: Patent Vertebral arteries: The intracranial vertebral arteries are patent. Venous sinuses: As permitted by contrast timing, patent. Anatomic variants: Fetal origin of the left PCA. Review of the MIP images confirms the above findings IMPRESSION: No CT evidence of acute intracranial abnormality. No large vessel occlusion. No high-grade stenosis of the major arteries in the neck. Electronically Signed   By:  Emily Filbert M.D.   On: 12/31/2023 16:25   DG Chest 2 View Result Date: 12/31/2023 CLINICAL DATA:  Chest pain. EXAM: CHEST - 2 VIEW COMPARISON:  November 15, 2023. FINDINGS: The heart size and mediastinal contours are within normal limits. Both lungs are clear. The visualized skeletal structures are unremarkable. IMPRESSION: No active cardiopulmonary disease. Electronically Signed   By: Lupita Raider M.D.   On: 12/31/2023 13:11    Procedures Procedures    Medications Ordered in ED Medications  iohexol (OMNIPAQUE) 350 MG/ML injection 75 mL (75 mLs Intravenous Contrast Given 12/31/23 1319)  ketorolac (TORADOL) 15 MG/ML injection 15 mg (15 mg Intravenous Given 12/31/23 1656)    ED Course/ Medical Decision Making/ A&P                                 Medical Decision Making 43 year old female with past medical history of HIV on Edwyna Shell therapy presents the emergency department today with chest pain and neck pain after smoke marijuana last night.  This somewhat reproducible here on exam and the patient does report this is worse with movement.  Suspect this is likely musculoskeletal.  The patient has been evaluated and does have a CT head and CT angiogram.  EKG is nonischemic.  Patient had 2 troponins here that are negative.  Suspicion for aortic dissection or PE is low at this time.  The patient's imaging studies here are reassuring.  He had resolution of his symptoms with the Toradol and is requesting discharge.  The patient exam is very reassuring and he is neurovascularly intact here.  He does not have any objective neurodeficits here on exam.  I did discuss with him return precautions this but at this point I think that he is stable for discharge.  Amount and/or Complexity of Data Reviewed Labs: ordered. Radiology: ordered.  Risk Prescription drug management.           Final Clinical Impression(s) / ED Diagnoses Final diagnoses:  Chest pain, unspecified type    Rx / DC  Orders ED Discharge Orders          Ordered    ketorolac (TORADOL) 10 MG tablet  Every 6 hours PRN        12/31/23 1702              Durwin Glaze, MD 12/31/23 1704

## 2023-12-31 NOTE — ED Triage Notes (Signed)
 Per EMS, Pt, from home, c/o L side chest pain radiating into L neck and intermittent blurred vision in L eye since 1500 yesterday.  Pain score 9/10.    324mg  Aspirin given en route.   Pt reports recently stopping hormone medication.

## 2023-12-31 NOTE — ED Notes (Signed)
 Refused nasal swab, stated "last time I had it done recently my nose bleed and its still sore".

## 2023-12-31 NOTE — Discharge Instructions (Signed)
 Your workup today was reassuring.  Your CT scans and x-ray did not show any acute findings.  Your heart tests here were reassuring as well.  Please stop your naproxen and start the ketorolac for pain.  Please follow-up with your doctor.  Return to the ER for fevers, weakness, or worsening symptoms.

## 2023-12-31 NOTE — ED Notes (Signed)
Lab made aware of add ons

## 2023-12-31 NOTE — ED Notes (Signed)
 Pt. Refused nasal swab. Pt. Stated, "the last time the nurse did that my nose bled so I don't want that."

## 2024-01-02 ENCOUNTER — Other Ambulatory Visit: Payer: Self-pay

## 2024-01-02 DIAGNOSIS — B20 Human immunodeficiency virus [HIV] disease: Secondary | ICD-10-CM

## 2024-01-02 MED ORDER — DOVATO 50-300 MG PO TABS: 1.0000 | ORAL_TABLET | Freq: Every day | ORAL | 0 refills | Status: DC

## 2024-01-02 NOTE — Telephone Encounter (Signed)
 Patient is scheduled for Friday January 03, 2024 @ 11:30.  Wanted it noted that during the visit they would like to address the prescriptions for Estradiol and Spirolactone. Along with the [facility]

## 2024-01-03 ENCOUNTER — Encounter: Payer: Self-pay | Admitting: Internal Medicine

## 2024-01-03 ENCOUNTER — Ambulatory Visit (INDEPENDENT_AMBULATORY_CARE_PROVIDER_SITE_OTHER): Admitting: Internal Medicine

## 2024-01-03 VITALS — BP 116/78 | HR 102 | Ht 67.0 in | Wt 273.0 lb

## 2024-01-03 DIAGNOSIS — Z789 Other specified health status: Secondary | ICD-10-CM

## 2024-01-03 MED ORDER — LUER LOCK SAFETY SYRINGES 22G X 1" 3 ML MISC: 1.0000 | 3 refills | Status: AC

## 2024-01-03 MED ORDER — ESTRADIOL VALERATE 20 MG/ML IM OIL: 20.0000 mg | TOPICAL_OIL | INTRAMUSCULAR | 1 refills | Status: DC

## 2024-01-03 MED ORDER — SPIRONOLACTONE 100 MG PO TABS: 100.0000 mg | ORAL_TABLET | Freq: Every day | ORAL | 3 refills | Status: DC

## 2024-01-03 NOTE — Patient Instructions (Signed)
 Take estradiol 20 mg (1 mL) once a month Take spironolactone 100 mg, 1 tablet twice daily

## 2024-01-03 NOTE — Progress Notes (Signed)
 Name: Rhonda Ayers  MRN/ DOB: [id number], 23-Feb-1981    Age/ Sex: 43 y.o., adult     PCP: Pcp, No   Reason for Endocrinology Evaluation: Gender Dysphoria      Initial Endocrinology Clinic Visit: 09/21/2021    PATIENT IDENTIFIER: [name] is a 43 y.o., adult with a past medical history of HIV, gender dysphoria. He has followed with  Endocrinology clinic since 09/21/2021 for consultative assistance with management of his gender dysphoria.   HISTORICAL SUMMARY: The patient was diagnosed with gender dysphoria and has been on gender affirming therapy since 2020.  Estrogen in 2022  No h/o DVT, dyslipidemia, liver disease, kidney disease, migraine, sleep apnea, heart disease, CVA, acne, diabetes, HTN, epilepsy, gallbladder disease, blood disorders, osteoporosis, or cancer.  SUBJECTIVE:    Today (01/03/2024):  Rhonda Ayers is here for a follow-up on gender dysphoria.  The pt has NOT been to our clinic in 17 months.  Patient ran out of medications for months  Patient continues with facial hair growth, has pending laser hair removal Denies nausea or vomiting  Denies constipation or diarrhea  Denies LE edema  Denies acne  Breast tissue is stable   Medications Estradiol valuate 20 mg (1 mL) monthly   Spironolactone 50 mg, 4 tabs daily     HISTORY:  Past Medical History:  Past Medical History:  Diagnosis Date   Asthma    Carpal tunnel syndrome 07/03/2016   Depression 07/03/2016   Gonorrhea 08/31/2020   Hand laceration 09/01/2018   Health care maintenance 01/28/2017   Health care maintenance 01/28/2017   HIV infection (HCC)    Homelessness 09/01/2018   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 His smoking use included cigarettes. He has never used smokeless tobacco. He reports that he does not currently use alcohol. He reports current drug use. Drug: Marijuana. Family History:  Family History  Problem Relation Age of Onset   Diabetes Mother      HOME MEDICATIONS: Allergies as of 01/03/2024       Reactions   Tramadol Other (See Comments)   "shakes"   Tylenol [acetaminophen]         Medication List        Accurate as of January 03, 2024 11:36 AM. If you have any questions, ask your nurse or doctor.          benzonatate 100 MG capsule Commonly known as: TESSALON Take 1 capsule (100 mg total) by mouth every 8 (eight) hours.   Dovato 50-300 MG tablet Generic drug: dolutegravir-lamiVUDine Take 1 tablet by mouth daily.   estradiol valerate 20 MG/ML injection Commonly known as: DELESTROGEN Inject 20 mg into the muscle every 28 (twenty-eight) days.   ketorolac 10 MG tablet Commonly known as: TORADOL Take 1 tablet (10 mg total) by mouth every 6 (six) hours as needed.   Luer Lock Safety Syringes 22G X 1" 3 ML Misc Generic drug: SYRINGE-NEEDLE (DISP) 3 ML 1 Device by Does not apply route every 30 (thirty) days.  methocarbamol 500 MG tablet Commonly known as: ROBAXIN Take 1 tablet (500 mg total) by mouth 2 (two) times daily.   ondansetron 4 MG disintegrating tablet Commonly known as: ZOFRAN-ODT Take 1 tablet (4 mg total) by mouth every 8 (eight) hours as needed.   spironolactone 50 MG tablet Commonly known as: Aldactone Take 1 tablet (50 mg total) by mouth daily.          OBJECTIVE:   PHYSICAL EXAM: VS: BP 116/78 (BP Location: Left Arm, Patient Position: Sitting, Cuff Size: Normal)   Pulse (!) 102   Ht 5\' 7"  (1.702 m)   Wt 273 lb (123.8 kg)   SpO2 98%   BMI  42.76 kg/m    EXAM: General: Pt appears well and is in NAD  Neck: General: Supple without adenopathy. Thyroid: Thyroid size normal.  No goiter or nodules appreciated.  Lungs: Clear with good BS bilat   Heart: Auscultation: RRR.  Extremities:  BL LE: No pretibial edema   Mental Status: Judgment, insight: Intact Orientation: Oriented to time, place, and person Mood and affect: No depression, anxiety, or agitation     DATA REVIEWED:   Latest Reference Range & Units 12/31/23 11:25  Sodium 135 - 145 mmol/L 132 (L)  Potassium 3.5 - 5.1 mmol/L 4.3  Chloride 98 - 111 mmol/L 102  CO2 22 - 32 mmol/L 22  Glucose 70 - 99 mg/dL 161 (H)  BUN 6 - 20 mg/dL 13  Creatinine 0.96 - 0.45 mg/dL 4.09 (H)  Calcium 8.9 - 10.3 mg/dL 9.0  Anion gap 5 - 15  8  Magnesium 1.7 - 2.4 mg/dL 2.0    Latest Reference Range & Units 12/31/23 11:25  GFR, Estimated >60 mL/min >60     Old records , labs and images have been reviewed.   ASSESSMENT / PLAN / RECOMMENDATIONS:   Gender Dysphoria :  -Patient has not been to our clinic in a year and a half, has not had her medications either -I confirm with the patient the pronouns needed to be used, patient confirms that they would like she/her pronouns to be used -Patient has a beard, but states not shaving as waiting for laser hair removal appointment -No point in checking labs today as the patient has not had medications, will recheck on next visit -Refill on spironolactone and estradiol was sent today    Medications   Continue estradiol valuate 20mg /mL to 20 mg (1 mL) monthly Take spironolactone 100 mg twice daily   Follow-up in 6 months  Signed electronically by: Lyndle Herrlich, MD  [facility] Endocrinology  [facility] [address] [place] [place], [place] [address] Phone: [phone] FAX: [phone]      CC: Pcp, No No address on file Phone: None  Fax: None   Return to Endocrinology clinic as  below: Future Appointments  Date Time Provider Department Center  01/23/2024  1:00 PM Blanchard Kelch, NP [facility]

## 2024-01-06 DIAGNOSIS — J45909 Unspecified asthma, uncomplicated: Secondary | ICD-10-CM | POA: Insufficient documentation

## 2024-01-07 ENCOUNTER — Telehealth: Payer: Self-pay

## 2024-01-07 NOTE — Telephone Encounter (Signed)
 Patient called requesting a GI referral.  He reports intermittent rectal bleeding for 2 months.  Patient has no PCP at this time, but has been approved for medicaid.  Patient currently looking to establish with pcp.  Elridge Stemm Jonathon Resides, CMA

## 2024-01-09 ENCOUNTER — Ambulatory Visit

## 2024-01-09 NOTE — Telephone Encounter (Signed)
 Spoke to patient and she is schedule with GI with [facility] on 02/06/24. Her scheduled this appointment himself. I advised her we would evaluate her as well when she comes in on 01/23/24. Also her preferred name is back to "Special

## 2024-01-13 ENCOUNTER — Ambulatory Visit: Admitting: Cardiology

## 2024-01-14 ENCOUNTER — Emergency Department (HOSPITAL_COMMUNITY)
Admission: EM | Admit: 2024-01-14 | Discharge: 2024-01-14 | Discharge status: Against Medical Advice | Attending: Emergency Medicine | Admitting: Emergency Medicine

## 2024-01-14 ENCOUNTER — Encounter (HOSPITAL_COMMUNITY): Payer: Self-pay

## 2024-01-14 ENCOUNTER — Emergency Department (HOSPITAL_COMMUNITY)

## 2024-01-14 ENCOUNTER — Other Ambulatory Visit: Payer: Self-pay

## 2024-01-14 DIAGNOSIS — R0602 Shortness of breath: Secondary | ICD-10-CM | POA: Diagnosis not present

## 2024-01-14 DIAGNOSIS — H538 Other visual disturbances: Secondary | ICD-10-CM | POA: Insufficient documentation

## 2024-01-14 DIAGNOSIS — Z5321 Procedure and treatment not carried out due to patient leaving prior to being seen by health care provider: Secondary | ICD-10-CM | POA: Insufficient documentation

## 2024-01-14 DIAGNOSIS — R079 Chest pain, unspecified: Secondary | ICD-10-CM | POA: Diagnosis present

## 2024-01-14 LAB — BASIC METABOLIC PANEL WITH GFR
Anion gap: 10 (ref 5–15)
BUN: 14 mg/dL (ref 6–20)
CO2: 23 mmol/L (ref 22–32)
Calcium: 9.6 mg/dL (ref 8.9–10.3)
Chloride: 102 mmol/L (ref 98–111)
Creatinine, Ser: 1.18 mg/dL — ABNORMAL HIGH (ref 0.44–1.00)
GFR, Estimated: 60 mL/min — ABNORMAL LOW (ref >60)
Glucose, Bld: 133 mg/dL — ABNORMAL HIGH (ref 70–99)
Potassium: 4.3 mmol/L (ref 3.5–5.1)
Sodium: 135 mmol/L (ref 135–145)

## 2024-01-14 LAB — CBC
HCT: 51.4 % — ABNORMAL HIGH (ref 36.0–46.0)
Hemoglobin: 17.8 g/dL — ABNORMAL HIGH (ref 12.0–15.0)
MCH: 31.4 pg (ref 26.0–34.0)
MCHC: 34.6 g/dL (ref 30.0–36.0)
MCV: 90.7 fL (ref 80.0–100.0)
Platelets: 303 10*3/uL (ref 150–400)
RBC: 5.67 MIL/uL — ABNORMAL HIGH (ref 3.87–5.11)
RDW: 14.1 % (ref 11.5–15.5)
WBC: 5.7 10*3/uL (ref 4.0–10.5)
nRBC: 0 % (ref 0.0–0.2)

## 2024-01-14 LAB — TROPONIN I (HIGH SENSITIVITY): Troponin I (High Sensitivity): 6 ng/L (ref <18)

## 2024-01-14 NOTE — ED Notes (Signed)
 Pt called x2 for lab work with no answer

## 2024-01-14 NOTE — ED Triage Notes (Signed)
 Pt c/o shob and cp for 2 weeks and blurred vision in left eye. Pt states here 2 weeks ago for same.

## 2024-01-23 ENCOUNTER — Ambulatory Visit: Payer: Self-pay | Admitting: Infectious Diseases

## 2024-01-27 ENCOUNTER — Ambulatory Visit (INDEPENDENT_AMBULATORY_CARE_PROVIDER_SITE_OTHER): Admitting: Podiatry

## 2024-01-27 ENCOUNTER — Encounter: Payer: Self-pay | Admitting: Podiatry

## 2024-01-27 ENCOUNTER — Ambulatory Visit

## 2024-01-27 DIAGNOSIS — M21611 Bunion of right foot: Secondary | ICD-10-CM

## 2024-01-27 DIAGNOSIS — M21619 Bunion of unspecified foot: Secondary | ICD-10-CM

## 2024-01-27 DIAGNOSIS — M76821 Posterior tibial tendinitis, right leg: Secondary | ICD-10-CM

## 2024-01-27 MED ORDER — MELOXICAM 15 MG PO TABS: 15.0000 mg | ORAL_TABLET | Freq: Every day | ORAL | 0 refills | Status: DC

## 2024-01-27 NOTE — Patient Instructions (Signed)
Posterior Tibial Tendinitis Rehab Ask your health care provider which exercises are safe for you. Do exercises exactly as told by your provider and adjust them as directed. It's normal to feel mild stretching, pulling, tightness, or discomfort as you do these exercises. Stop right away if you feel sudden pain or your pain gets worse. Do not begin these exercises until told by your provider. Stretching and range-of-motion exercises These exercises warm up your muscles and joints and improve the movement and flexibility in your ankle and foot. These exercises may also help to relieve pain. Standing wall calf stretch, knee straight  Stand with your hands against a wall. Extend your left / right leg behind you, and bend your front knee slightly. If told, place a folded washcloth under the arch of your foot for support. Point the toes of your back foot slightly inward. Keeping your heels on the floor and your back knee straight, shift your weight toward the wall. Do not allow your back to arch. You should feel a gentle stretch in your upper left / right calf. Hold this position for __________ seconds. Repeat __________ times. Complete this exercise __________ times a day. Standing wall calf stretch, knee bent  Stand with your hands against a wall. Extend your left / right leg behind you, and bend your front knee slightly. If told, place a folded washcloth under the arch of your foot for support. Point the toes of your back foot slightly inward. Unlock your back knee so it's bent. Keep your heels on the floor. You should feel a gentle stretch deep in your lower left / right calf. Hold this position for __________ seconds. Repeat __________ times. Complete this exercise __________ times a day. Strengthening exercises These exercises build strength and endurance in your ankle and foot. Endurance is the ability to use your muscles for a long time, even after they get tired. Ankle inversion with  band  Secure one end of a rubber exercise band or tubing to a fixed object, such as a table leg or a pole, that will stay still when the band is pulled. Loop the other end of the band around the middle of your left / right foot. Sit on the floor facing the object with your left / right leg extended. The band or tube should be slightly tense when your foot is relaxed. Leading with your big toe, slowly bring your left / right foot and ankle inward, toward your other foot (inversion). Hold this position for __________ seconds. Slowly return your foot to the starting position. Repeat __________ times. Complete this exercise __________ times a day. Towel curls  Sit in a chair on a non-carpeted surface, and put your feet on the floor. Place a towel in front of your feet. If told by your provider, add a __________ weight to the end of the towel. Keeping your heel on the floor, put your left / right foot on the towel. Pull the towel toward you by grabbing the towel with your toes and curling them under. Keep your heel on the floor while you do this. Let your toes relax. Grab the towel with your toes again. Keep going until the towel is completely underneath your foot. Repeat __________ times. Complete this exercise __________ times a day. Balance exercise This exercise improves or maintains your balance. Balance is important in preventing falls. Single leg stand  Without wearing shoes, stand near a railing or in a doorway. You may hold on to the railing or   doorframe as needed for balance. Stand on your left / right foot. Keep your big toe down on the floor and try to keep your arch lifted. If balancing in this position is too easy, try the exercise with your eyes closed or while standing on a pillow. Hold this position for __________ seconds. Repeat __________ times. Complete this exercise __________ times a day. This information is not intended to replace advice given to you by your health care  provider. Make sure you discuss any questions you have with your health care provider. Document Revised: 10/03/2022 Document Reviewed: 10/03/2022 Elsevier Patient Education  2024 [employer]  

## 2024-01-27 NOTE — Progress Notes (Signed)
  Subjective:  Patient ID: Rhonda Ayers, adult    DOB: [birth year],   MRN: [id number]  No chief complaint on file.   43 y.o. adult presents for concern of right foot bunion and heel pain that has been present for years and progressively worsening. Has gotten to the point now where he can barely put weight on the foot and very painful to get up in the morning. States most pain on the inside arch of foot and ankle. Has difficulty moving it currently. Also relates history of right foot bunion states not much bother aside from certain shoes. Has had left bunion fixed in past  .Relates burning and tingling in their feet. Patient is diabetic and last A1c was  Lab Results  Component Value Date   HGBA1C 6.3 (H) 05/13/2023   .   PCP:  Pcp, No    Denies any other pedal complaints. Denies n/v/f/c.   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 CFT <3 seconds. Normal hair growth on digits. No edema.  Skin. No lacerations or abrasions bilateral feet.  Musculoskeletal: Deceased ROM in DF of ankle joint. Mild HAV deformity noted. Tender along PT tendon on right foot. Pain to insertion and  proximal to medial malleolus. Unable to invert and can barely DF or PF. Gait antalgic. Unable to perform single limb heel rise.  Neurological: Sensation intact to light touch.   Assessment:   1. Posterior tibial tendon dysfunction (PTTD) of right lower extremity   2. Bunion, right      Plan:  Patient was evaluated and treated and all questions answered. X-rays reviewed and discussed with patient. Not fully weight bearing due to pain. Mild HAV deformity noted. No acute fractures or dislocation noted.  Discussed PTTD diagnosis and treatment options with patient. Stretching exercises discussed and handout dispensed. Prescription for meloxicam provided. Will try for one month. Cr is a concern.  Tri-Lock ankle brace dispensed. Discussed if there is no improvement PT/MRI/injection may be an option. Patient to return to clinic in 6 to 8 weeks or sooner if symptoms fail to improve or worsen.   Jennefer Moats, DPM

## 2024-02-02 ENCOUNTER — Emergency Department (HOSPITAL_COMMUNITY)
Admission: EM | Admit: 2024-02-02 | Discharge: 2024-02-02 | Disposition: A | Discharge status: Home / Self Care | Attending: Emergency Medicine | Admitting: Emergency Medicine

## 2024-02-02 ENCOUNTER — Encounter (HOSPITAL_COMMUNITY): Payer: Self-pay | Admitting: Emergency Medicine

## 2024-02-02 ENCOUNTER — Other Ambulatory Visit: Payer: Self-pay

## 2024-02-02 DIAGNOSIS — M21611 Bunion of right foot: Secondary | ICD-10-CM | POA: Insufficient documentation

## 2024-02-02 DIAGNOSIS — M76821 Posterior tibial tendinitis, right leg: Secondary | ICD-10-CM | POA: Diagnosis present

## 2024-02-02 DIAGNOSIS — Z7689 Persons encountering health services in other specified circumstances: Secondary | ICD-10-CM | POA: Diagnosis not present

## 2024-02-02 NOTE — ED Triage Notes (Signed)
 Pt reports they need a note to return to work.

## 2024-02-02 NOTE — ED Provider Notes (Signed)
 [place] EMERGENCY DEPARTMENT AT [facility] Provider Note   CSN: [id number] Arrival date & time: 02/02/24  1323     History  Chief Complaint  Patient presents with   Letter for School/Work    Rhonda Ayers is a 43 y.o. adult with past medical history of posterior tibial tendon dysfunction of right lower extremity, right bunion presents emergency department for work clearance note to return to work today.  He follows Dr. Jennefer Moats at [facility].  He last saw her 01/27/2024 and reports that they medically cleared him to return to work.  He has surgery scheduled in September. He denies foot pain, swelling, fevers, paresthesia, weakness.  HPI     Home Medications Prior to Admission medications   Medication Sig Start Date End Date Taking? Authorizing Provider  benzonatate  (TESSALON ) 100 MG capsule Take 1 capsule (100 mg total) by mouth every 8 (eight) hours. Patient not taking: Reported on 01/03/2024 11/15/23   Henderly, Britni A, PA-C  dolutegravir -lamiVUDine (DOVATO ) 50-300 MG tablet Take 1 tablet by mouth daily. 01/02/24   Orson Blalock, NP  estradiol  valerate (DELESTROGEN ) 20 MG/ML injection Inject 1 mL (20 mg total) into the muscle every 28 (twenty-eight) days. 01/03/24   Shamleffer, Ibtehal Jaralla, MD  meloxicam  (MOBIC ) 15 MG tablet Take 1 tablet (15 mg total) by mouth daily. 01/27/24   Sikora, Rebecca, DPM  methocarbamol  (ROBAXIN ) 500 MG tablet Take 1 tablet (500 mg total) by mouth 2 (two) times daily. 06/08/23   Abelardo Hoehn B, PA-C  ondansetron  (ZOFRAN -ODT) 4 MG disintegrating tablet Take 1 tablet (4 mg total) by mouth every 8 (eight) hours as needed. 11/15/23   Henderly, Britni A, PA-C  spironolactone  (ALDACTONE ) 100 MG tablet Take 1 tablet (100 mg total) by mouth daily. 01/03/24   Shamleffer, Ibtehal Jaralla, MD  SYRINGE-NEEDLE, DISP, 3 ML (LUER LOCK SAFETY SYRINGES) 22G X 1" 3 ML MISC 1 Device by Does not apply route every 30 (thirty) days.  01/03/24   Shamleffer, Ibtehal Jaralla, MD      Allergies    Tramadol  and Tylenol  [acetaminophen ]    Review of Systems   Review of Systems  Constitutional:  Negative for chills, fatigue and fever.  Respiratory:  Negative for cough, chest tightness, shortness of breath and wheezing.   Cardiovascular:  Negative for chest pain and palpitations.  Gastrointestinal:  Negative for abdominal pain, constipation, diarrhea, nausea and vomiting.  Neurological:  Negative for dizziness, seizures, weakness, light-headedness, numbness and headaches.    Physical Exam Updated Vital Signs BP (!) 153/96 (BP Location: Left Arm)   Pulse 85   Temp 98.2 F (36.8 C) (Oral)   Resp 18   SpO2 99%  Physical Exam Vitals and nursing note reviewed.  Constitutional:      General: She is not in acute distress.    Appearance: Normal appearance.  HENT:     Head: Normocephalic and atraumatic.  Eyes:     Conjunctiva/sclera: Conjunctivae normal.  Cardiovascular:     Rate and Rhythm: Normal rate.     Pulses:          Dorsalis pedis pulses are 2+ on the right side and 2+ on the left side.  Pulmonary:     Effort: Pulmonary effort is normal. No respiratory distress.     Breath sounds: Normal breath sounds.  Chest:     Chest wall: No tenderness.  Musculoskeletal:     Right lower leg: No edema.     Left  lower leg: No edema.     Comments: No swelling nor TTP of BLE Motor 5/5 of BLE.  Sensation 2/2 of BLE  Skin:    General: Skin is warm.     Coloration: Skin is not jaundiced or pale.  Neurological:     Mental Status: She is alert and oriented to person, place, and time. Mental status is at baseline.     Sensory: No sensory deficit.     Motor: No weakness.     Coordination: Coordination normal.     Gait: Gait normal.     ED Results / Procedures / Treatments   Labs (all labs ordered are listed, but only abnormal results are displayed) Labs Reviewed - No data to display  EKG None  Radiology No  results found.  Procedures Procedures    Medications Ordered in ED Medications - No data to display  ED Course/ Medical Decision Making/ A&P                                 Medical Decision Making  Patient presents to the ED to obtain work clearance.  Co morbidities that complicate the patient evaluation  See HPI   Additional history obtained:  Additional history obtained from Nursing and Outside Medical Records   External records from outside source obtained and reviewed including triage RN note, treatment from 01/27/2024, x-rays from 01/27/2024    Consultations Obtained:  I requested consultation with podiatry Dr. Michalene Agee at 2:20 PM to confirm patient can return to work. He reviewed note from 01/27/24 and agrees that if patient reported his podiatrist recommended patient to return to work that he is able to   Problem List / ED Course:  Posterior tibial tendon dysfunction of right lower extremity Right bunion Followed by podiatry for both.  Last seen 01/27/2024 No new symptoms, swelling, pain, paresthesia, nor weakness X-ray results explained to patient at last visit with podiatry.  Has ankle brace Interested in having work note to return to work today   Reevaluation:  After the interventions noted above, I reevaluated the patient and found that they have :stayed the same    Dispostion:  After consideration of the diagnostic results and the patients response to treatment, I feel that the patent would benefit from outpatient management with podiatry follow-up as scheduled.   Discussed ED workup, disposition, return to ED precautions with patient who expresses understanding agrees with plan.  All questions answered to their satisfaction.  They are agreeable to plan.  Discharge instructions provided on paperwork  Final Clinical Impression(s) / ED Diagnoses Final diagnoses:  Posterior tibial tendon dysfunction (PTTD) of right lower extremity  Bunion of right foot   Return to work evaluation    Rx / DC Orders ED Discharge Orders     None         Royann Cords, [place] 02/02/24 1420    Wynetta Heckle, MD 02/02/24 2250

## 2024-02-02 NOTE — Discharge Instructions (Addendum)
 Thank you for letting evaluate you today.  I have spoken to podiatry who agrees that you are able to return to work.  I provided you with a work note.  Please follow-up with podiatry as scheduled

## 2024-02-14 ENCOUNTER — Ambulatory Visit: Admitting: Infectious Diseases

## 2024-02-25 ENCOUNTER — Telehealth: Payer: Self-pay

## 2024-02-25 NOTE — Telephone Encounter (Signed)
 Patient called wanting to know if [name],NP ordered a Chest CT for her. Informed patient that hasn't been ordered. Patient no showed last appointment.   Patient stated that she been having chest pain, numbness in left arm, and dizziness. I told patient based on her symptoms it sounds like she may need to go to the ED. Patient stated that the ED doesn't do anything and if she doesn't hear from [name],NP in the next 72 hours she will go to [facility]. Informed patient I would relay her message to provider and we ended the call.    Rhonda Ayers Roann Chestnut, CMA

## 2024-02-25 NOTE — Telephone Encounter (Signed)
 Informed patient that provider recommends she goes to the ED as well based off symptoms.  Patient declined to go to ED. Patient stated that she has an appointment with PCP on Thursday.    Janiaya Ryser Roann Chestnut, CMA

## 2024-02-28 ENCOUNTER — Telehealth: Payer: Self-pay | Admitting: Dietician

## 2024-02-28 ENCOUNTER — Telehealth: Payer: Self-pay

## 2024-02-28 ENCOUNTER — Other Ambulatory Visit: Payer: Self-pay

## 2024-02-28 DIAGNOSIS — R7303 Prediabetes: Secondary | ICD-10-CM

## 2024-02-28 NOTE — Telephone Encounter (Signed)
 Patient called and states that he is out of his Estradiol  and needs a refill prescription sent to [facility] specialty pharmacy in [place].  Patient also had questions related to his prediabetes.  Answered briefly (Continue to exercise, avoid sugary drinks, eat a low fat diet with plenty of vegetables.  Discussed that some HIV drugs may also cause higher blood glucose readings.)  Instructed patient to ask for a nutrition referral if he would like to discuss further.  Cydne Doyne, RD, LDN, CDCES, DipACLM

## 2024-02-28 NOTE — Telephone Encounter (Signed)
 Message sent to patient complete

## 2024-03-02 NOTE — Progress Notes (Signed)
 The 10-year ASCVD risk score (Arnett DK, et al., 2019) is: 5.6%   Values used to calculate the score:     Age: 43 years     Sex: Female     Is Non-Hispanic African American: No     Diabetic: Yes     Tobacco smoker: No     Systolic Blood Pressure: 153 mmHg     Is BP treated: No     HDL Cholesterol: 37 mg/dL     Total Cholesterol: 256 mg/dL  No current statin therapy, next appointment note updated.   Fredna Stricker, BSN, RN

## 2024-03-03 ENCOUNTER — Telehealth: Payer: Self-pay

## 2024-03-03 NOTE — Telephone Encounter (Signed)
 Patient called wanting to know if [name],NP could put in a referral for PCP to [name], NP - informed patient she wouldn't need a referral for PCP she can call and make an appointment. Patient stated she was told by primary care on [address] that a referral could get her in quicker.    Informed patient she would need to follow up at appointment with us  on 6/18 as well.    [name], CMA

## 2024-03-04 NOTE — Telephone Encounter (Signed)
 Estradiol  prescription was picked up last in  April 01/28/24.  Medication comes in the 5ml vial but only last 28 days so patient would only be getting one dose per vial. New prescription is needed with 5ml Qty.

## 2024-03-05 MED ORDER — ESTRADIOL VALERATE 10 MG/ML IM OIL: 20.0000 mg | TOPICAL_OIL | INTRAMUSCULAR | 2 refills | Status: AC

## 2024-03-05 NOTE — Telephone Encounter (Signed)
 LMTCB

## 2024-03-10 NOTE — Telephone Encounter (Signed)
 LMTCB

## 2024-03-10 NOTE — Telephone Encounter (Signed)
 [facility] states that medication was picked up on 03/05/24

## 2024-03-16 ENCOUNTER — Ambulatory Visit (INDEPENDENT_AMBULATORY_CARE_PROVIDER_SITE_OTHER): Admitting: Podiatry

## 2024-03-16 DIAGNOSIS — Z91199 Patient's noncompliance with other medical treatment and regimen due to unspecified reason: Secondary | ICD-10-CM

## 2024-03-16 NOTE — Progress Notes (Signed)
 No show

## 2024-03-18 ENCOUNTER — Ambulatory Visit: Admitting: Infectious Diseases

## 2024-03-30 NOTE — Progress Notes (Unsigned)
 HPI: Rhonda Ayers is a 43 y.o. adult who presents to the [facility] pharmacy clinic for HIV follow-up.  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   DOLUTEGRAVIR -LAMIVUDINE (DOVATO ) 50-300 MG TABLET    Take 1 tablet by mouth daily.   ESTRADIOL  VALERATE 10 MG/ML OIL    Inject 2 mLs (20 mg total) into the muscle every 30 (thirty) days.   MELOXICAM  (MOBIC ) 15 MG TABLET    Take 1 tablet (15 mg total) by mouth daily.   METHOCARBAMOL  (ROBAXIN ) 500 MG TABLET    Take 1 tablet (500 mg total) by mouth 2 (two) times daily.   ONDANSETRON  (ZOFRAN -ODT) 4 MG DISINTEGRATING TABLET    Take 1 tablet (4 mg total) by mouth every 8 (eight) hours as needed.   SPIRONOLACTONE  (ALDACTONE ) 100 MG TABLET    Take 1 tablet (100 mg total) by mouth daily.   SYRINGE-NEEDLE, DISP, 3 ML (LUER LOCK SAFETY SYRINGES) 22G X 1 3 ML MISC    1 Device by Does not apply route every 30 (thirty) days.  Modified Medications   No medications on file  Discontinued Medications   No medications on file    Labs: Lab Results  Component Value Date   HIV1RNAQUANT 20 (H) 09/02/2023   HIV1RNAQUANT Not Detected 05/13/2023   HIV1RNAQUANT 25 (H) 07/30/2022   CD4TABS 735 09/02/2023   CD4TABS 719 05/13/2023   CD4TABS 697 07/30/2022    RPR and STI Lab Results  Component Value Date   LABRPR REACTIVE (A) 09/02/2023   LABRPR REACTIVE (A) 07/01/2023   LABRPR REACTIVE (A) 07/30/2022   LABRPR REACTIVE  (A) 11/15/2021   LABRPR REACTIVE (A) 10/27/2020   RPRTITER 1:8 (H) 09/02/2023   RPRTITER 1:4 (H) 07/01/2023   RPRTITER 1:8 (H) 07/30/2022   RPRTITER 1:16 (H) 11/15/2021   RPRTITER 1:2 (H) 10/27/2020    STI Results GC CT  09/02/2023 10:33 AM Negative  Negative   07/01/2023  2:27 PM Negative  Negative   07/01/2023  2:26 PM Negative  Negative   07/01/2023  2:24 PM Negative  Negative   10/27/2020  4:17 PM Negative  Negative   07/27/2020  4:16 PM Positive    Negative  Negative    Negative   12/15/2018 12:00 AM Negative    Negative    Negative  Negative    Negative    Negative   08/04/2018 12:00 AM Negative  Negative   06/11/2018 12:00 AM Negative  Negative   01/07/2018 12:00 AM Negative  Negative   08/05/2017 12:00 AM Negative    Negative    Negative  Negative    Negative    **POSITIVE**   01/28/2017 12:00 AM Negative    **POSITIVE**  Negative    **POSITIVE**   07/03/2016 12:00 AM Negative  Negative   03/07/2016 12:00 AM Negative    Negative*    Negative*  Negative    Negative*  Negative*   12/21/2014 12:00 AM NG: Negative  CT: Negative     Hepatitis B Lab Results  Component Value Date   HEPBSAB NON-REACTIVE 07/30/2022   HEPBSAG NON-REACTIVE 07/30/2022   HEPBCAB NON REACTIVE 12/21/2014   Hepatitis C No results found for: HEPCAB, HCVRNAPCRQN Hepatitis A Lab Results  Component Value Date   HAV REACTIVE (A) 12/21/2014   Lipids: Lab Results  Component Value Date   CHOL 256 (H) 09/02/2023   TRIG 230 (H) 09/02/2023   HDL 37 (L) 09/02/2023   CHOLHDL 6.9 (H) 09/02/2023   VLDL 28 12/12/2016   LDLCALC 180 (H) 09/02/2023    Current HIV Regimen: Dovato   Assessment: Special is a 44 year-old woman here to re-establish care for HIV. She was last seen in office in September 2024 by NP Gibson Kurtz. At this visit patient's primary concern was irregular bowel movements. She denied skin changes, ulcerations, and lymphadenopathy. No rectal or penile  discharge/drainage. Trevor Fudge expressed concern that symptoms may be related to syphilis in note, however elected to forgo empiric treatment that day. Encouraged patient to try magnesium citrate, increase water/high fiber intake. RPR and cytologies collected. Cytologies negative, RPR titer not indicative of a new infection.   Review of dispense history reveals Dovato  was last filled in May 2025. Fill gap between December 2024 to March 2025. HIV RNA from November 2024 was undetectable (20) and CD4 count was 735.   Today she reports her gap in care is due to having to travel back and forth to [place]  to take care of her grandmother. She denied any missed doses in the interim. She always had medication on hand. She has never had to space out doses to make her supply last. She still has supply at home and last filled Dovato  earlier this month.  She declined STI screening today.   Eligible for Shingrix immunization but defers this today.   Plan: -Order HIV RNA, CD4 count, HBV antibody -Continue Dovato  - refills sent -F/u in 1 month with Trevor Fudge on 05/05/24  Tolu Jaquavian Firkus, PharmD [facility] PGY-1

## 2024-04-01 ENCOUNTER — Ambulatory Visit (INDEPENDENT_AMBULATORY_CARE_PROVIDER_SITE_OTHER): Admitting: Pharmacist

## 2024-04-01 ENCOUNTER — Other Ambulatory Visit: Payer: Self-pay

## 2024-04-01 ENCOUNTER — Ambulatory Visit: Admitting: Infectious Diseases

## 2024-04-01 DIAGNOSIS — B20 Human immunodeficiency virus [HIV] disease: Secondary | ICD-10-CM | POA: Diagnosis present

## 2024-04-01 MED ORDER — DOVATO 50-300 MG PO TABS: 1.0000 | ORAL_TABLET | Freq: Every day | ORAL | 5 refills | Status: DC

## 2024-04-02 LAB — T-HELPER CELLS (CD4) COUNT (NOT AT ARMC)
CD4 % Helper T Cell: 41 % (ref 33–65)
CD4 T Cell Abs: 735 /uL (ref 400–1790)

## 2024-04-05 LAB — HIV RNA, RTPCR W/R GT (RTI, PI,INT)
HIV 1 RNA Quant: 38 {copies}/mL — ABNORMAL HIGH
HIV-1 RNA Quant, Log: 1.58 {Log_copies}/mL — ABNORMAL HIGH

## 2024-04-05 LAB — HEPATITIS B SURFACE ANTIBODY,QUALITATIVE: Hep B S Ab: NONREACTIVE

## 2024-04-21 ENCOUNTER — Other Ambulatory Visit: Payer: Self-pay

## 2024-04-21 ENCOUNTER — Ambulatory Visit
Admission: EM | Admit: 2024-04-21 | Discharge: 2024-04-21 | Disposition: A | Discharge status: Home / Self Care | Attending: Family Medicine | Admitting: Family Medicine

## 2024-04-21 DIAGNOSIS — J020 Streptococcal pharyngitis: Secondary | ICD-10-CM

## 2024-04-21 LAB — POCT RAPID STREP A (OFFICE): Rapid Strep A Screen: POSITIVE — AB

## 2024-04-21 MED ORDER — AMOXICILLIN 500 MG PO CAPS: 500.0000 mg | ORAL_CAPSULE | Freq: Two times a day (BID) | ORAL | 0 refills | Status: AC

## 2024-04-21 NOTE — Discharge Instructions (Signed)
 You tested positive for strep throat.  Start amoxicillin  twice daily for 10 days.  You may do salt water gargles and warm liquids such as teas and honey.  Over-the-counter ibuprofen  or Tylenol  as needed.  Lots of rest.  Follow-up with your PCP if your symptoms do not improve.  Please go to the ER for any worsening symptoms.  Hope you feel better soon!

## 2024-04-21 NOTE — ED Provider Notes (Signed)
 [facility]    CSN: [id number] Arrival date & time: 04/21/24  9175      History   Chief Complaint Chief Complaint  Patient presents with   Sore Throat    HPI Rhonda Ayers is a 43 y.o. adult  presents for evaluation of URI symptoms for 2 days.  Patient does have a past medical history of HIV, and diabetes. patient reports associated symptoms of sore throat with throat congestion. Denies N/V/D, cough, nasal congestion, fevers, ear pain, bodies, shortness of breath. Patient does not have a hx of asthma. Patient is not an active smoker.   Reports no sick contacts.  Pt has taken salt water gargles OTC for symptoms. Pt has no other concerns at this time.    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   Left leg pain 07/03/2016   Pre-diabetes 11/23/2021   Rectal pain 12/15/2018   Screening for STDs (sexually transmitted diseases) 07/05/2023   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   Type 2 diabetes mellitus without complication, without long-term current use of insulin (HCC) 09/24/2022   Weight gain 07/31/2022   Work related injury 09/01/2018    Patient Active Problem List   Diagnosis Date Noted   Asthma    Constipation 07/05/2023   Screening for STDs (sexually transmitted diseases) 07/05/2023   Chest pain 05/13/2023   Weight gain 07/31/2022   Pre-diabetes 11/23/2021   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      Home Medications    Prior to Admission medications   Medication Sig Start Date End Date Taking? Authorizing Provider  amoxicillin  (AMOXIL ) 500 MG capsule Take 1 capsule (500 mg total) by mouth 2 (two) times daily for 10 days. 04/21/24 05/01/24 Yes Jackie Littlejohn, Jodi R, NP  benzonatate  (TESSALON ) 100 MG capsule Take 1 capsule (100 mg total) by mouth every 8 (eight) hours. Patient not taking: Reported on 01/03/2024 11/15/23   Henderly, Britni A, PA-C  dolutegravir -lamiVUDine (DOVATO ) 50-300 MG tablet Take 1 tablet by mouth daily. 04/01/24   Kuppelweiser, Cassie L, RPH-CPP  Estradiol  Valerate 10 MG/ML OIL Inject 2 mLs (20 mg total) into the muscle every 30 (thirty) days. 03/05/24   Shamleffer, Ibtehal Jaralla, MD  meloxicam  (MOBIC ) 15 MG tablet Take 1 tablet (15 mg total) by mouth daily. 01/27/24   Sikora, Rebecca, DPM  methocarbamol  (ROBAXIN ) 500 MG tablet Take 1 tablet (500 mg total) by mouth 2 (two) times daily. 06/08/23   Logan Ubaldo NOVAK, PA-C  ondansetron  (ZOFRAN -ODT) 4 MG disintegrating tablet Take 1 tablet (4 mg total) by mouth every 8 (eight) hours as needed. 11/15/23   Henderly, Britni A, PA-C  spironolactone  (ALDACTONE ) 100 MG tablet Take 1 tablet (100 mg total) by mouth daily. 01/03/24   Shamleffer, Ibtehal  Jaralla, MD  SYRINGE-NEEDLE, DISP, 3 ML (LUER LOCK SAFETY SYRINGES) 22G X 1 3 ML MISC 1 Device by Does not apply route every 30 (thirty) days. 01/03/24   Shamleffer, Donell Cardinal, MD    Family History Family History  Problem Relation Age of Onset   Diabetes Mother     Social History Social History   Tobacco Use   Smoking status: Former    Current packs/day: 0.10    Types: Cigarettes   Smokeless tobacco: Never   Tobacco comments:    2 cigarettes a day   Vaping Use   Vaping status: Never Used  Substance Use Topics   Alcohol use: Not Currently    Drug use: Yes    Types: Marijuana     Allergies   Tramadol  and Tylenol  [acetaminophen ]   Review of Systems Review of Systems  HENT:  Positive for sore throat.      Physical Exam Triage Vital Signs ED Triage Vitals  Encounter Vitals Group     BP 04/21/24 0836 (!) 154/92     Girls Systolic BP Percentile --      Girls Diastolic BP Percentile --      Boys Systolic BP Percentile --      Boys Diastolic BP Percentile --      Pulse Rate 04/21/24 0836 84     Resp 04/21/24 0836 16     Temp 04/21/24 0836 98.7 F (37.1 C)     Temp Source 04/21/24 0836 Oral     SpO2 04/21/24 0836 97 %     Weight --      Height --      Head Circumference --      Peak Flow --      Pain Score 04/21/24 0834 10     Pain Loc --      Pain Education --      Exclude from Growth Chart --    No data found.  Updated Vital Signs BP (!) 154/92   Pulse 84   Temp 98.7 F (37.1 C) (Oral)   Resp 16   SpO2 97%   Visual Acuity Right Eye Distance:   Left Eye Distance:   Bilateral Distance:    Right Eye Near:   Left Eye Near:    Bilateral Near:     Physical Exam Vitals and nursing note reviewed.  Constitutional:      General: She is not in acute distress.    Appearance: She is well-developed. She is not ill-appearing.  HENT:     Head: Normocephalic and atraumatic.     Right Ear: Tympanic membrane and ear canal normal.     Left Ear: Tympanic membrane and ear canal normal.     Nose: No congestion.     Mouth/Throat:     Mouth: Mucous membranes are moist.     Pharynx: Oropharynx is clear. Uvula midline. Posterior oropharyngeal erythema present.     Tonsils: No tonsillar exudate or tonsillar abscesses.  Eyes:     Conjunctiva/sclera: Conjunctivae normal.     Pupils: Pupils are equal, round, and reactive to light.  Cardiovascular:     Rate and Rhythm: Normal rate and regular rhythm.     Heart sounds: Normal heart sounds.  Pulmonary:     Effort: Pulmonary effort is normal. No respiratory distress.      Breath sounds: Normal breath sounds. No stridor. No wheezing, rhonchi or rales.  Musculoskeletal:     Cervical back: Normal range of motion and neck  supple.  Lymphadenopathy:     Cervical: No cervical adenopathy.  Skin:    General: Skin is warm and dry.  Neurological:     General: No focal deficit present.     Mental Status: She is alert and oriented to person, place, and time.  Psychiatric:        Mood and Affect: Mood normal.        Behavior: Behavior normal.      UC Treatments / Results  Labs (all labs ordered are listed, but only abnormal results are displayed) Labs Reviewed  POCT RAPID STREP A (OFFICE) - Abnormal; Notable for the following components:      Result Value   Rapid Strep A Screen Positive (*)    All other components within normal limits    EKG   Radiology No results found.  Procedures Procedures (including critical care time)  Medications Ordered in UC Medications - No data to display  Initial Impression / Assessment and Plan / UC Course  I have reviewed the triage vital signs and the nursing notes.  Pertinent labs & imaging results that were available during my care of the patient were reviewed by me and considered in my medical decision making (see chart for details).     Reviewed exam and symptoms with patient.  no red flags. patient declined COVID testing.  Positive rapid strep.  Start amoxicillin .  Discussed salt water gargles warm liquids and OTC analgesics as needed.  PCP follow-up if symptoms do not improve.  ER precautions reviewed. Final Clinical Impressions(s) / UC Diagnoses   Final diagnoses:  Streptococcal sore throat     Discharge Instructions      You tested positive for strep throat.  Start amoxicillin  twice daily for 10 days.  You may do salt water gargles and warm liquids such as teas and honey.  Over-the-counter ibuprofen  or Tylenol  as needed.  Lots of rest.  Follow-up with your PCP if your symptoms do not improve.   Please go to the ER for any worsening symptoms.  Hope you feel better soon!     ED Prescriptions     Medication Sig Dispense Auth. Provider   amoxicillin  (AMOXIL ) 500 MG capsule Take 1 capsule (500 mg total) by mouth 2 (two) times daily for 10 days. 20 capsule Tamecka Milham, Jodi R, NP      PDMP not reviewed this encounter.   Loreda Myla SAUNDERS, NP 04/21/24 [phone]

## 2024-04-21 NOTE — ED Triage Notes (Signed)
 Pt c/o sore throat, throat swelling and dysphagiax2d.

## 2024-05-05 ENCOUNTER — Ambulatory Visit: Admitting: Infectious Diseases

## 2024-05-11 ENCOUNTER — Ambulatory Visit: Admitting: Infectious Diseases

## 2024-07-06 ENCOUNTER — Encounter: Payer: Self-pay | Admitting: Internal Medicine

## 2024-07-06 ENCOUNTER — Ambulatory Visit: Admitting: Internal Medicine

## 2024-07-06 NOTE — Progress Notes (Deleted)
 Name: Rhonda Ayers  MRN/ DOB: [id number], Jan 31, 1981    Age/ Sex: 43 y.o., adult     PCP: [facility]   Reason for Endocrinology Evaluation: Gender Dysphoria      Initial Endocrinology Clinic Visit: 09/21/2021    PATIENT IDENTIFIER: Rhonda Ayers is a 43 y.o., adult with a past medical history of HIV, gender dysphoria. She has followed with [facility] since 09/21/2021 for consultative assistance with management of her gender dysphoria.   HISTORICAL SUMMARY: The patient was diagnosed with gender dysphoria and has been on gender affirming therapy since 2020.  Estrogen in 2022  No h/o DVT, dyslipidemia, liver disease, kidney disease, migraine, sleep apnea, heart disease, CVA, acne, diabetes, HTN, epilepsy, gallbladder disease, blood disorders, osteoporosis, or cancer.  SUBJECTIVE:    Today (07/06/2024):  Rhonda Ayers is here for a follow-up on gender dysphoria.   Patient continues to follow-up with infectious disease clinic for HIV Patient continues with facial hair growth, has pending laser hair removal Denies nausea or vomiting  Denies constipation or diarrhea  Denies LE edema  Denies acne  Breast tissue is stable   Medications Estradiol  valuate 20 mg (1 mL) monthly   Spironolactone  50 mg, 4 tabs daily     HISTORY:  Past Medical History:  Past Medical History:  Diagnosis Date   Asthma    Carpal tunnel syndrome 07/03/2016   Chest pain 05/13/2023   Constipation 07/05/2023   Depression 07/03/2016   Gonorrhea 08/31/2020   Hand laceration 09/01/2018   Health care maintenance 01/28/2017   Health care maintenance 01/28/2017   HIV disease (HCC) 01/31/2015   HIV infection (HCC)    Homelessness 09/01/2018   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 Her smoking use included cigarettes. She has never used smokeless tobacco. She reports that she does not currently use alcohol. She reports current drug use. Drug: Marijuana. Family History:  Family History  Problem Relation Age of Onset   Diabetes Mother      HOME MEDICATIONS: Allergies as of 07/06/2024       Reactions   Tramadol  Other (See Comments)   shakes   Tylenol  [acetaminophen ]         Medication List        Accurate as of July 06, 2024  7:23 AM. If you have any questions, ask your nurse or doctor.          benzonatate  100 MG capsule Commonly known as: TESSALON  Take 1 capsule (100 mg total) by mouth every 8 (eight) hours.   Dovato  50-300 MG tablet Generic drug: dolutegravir -lamiVUDine Take 1 tablet by mouth daily.   Estradiol  Valerate 10 MG/ML Oil Inject 2 mLs (20 mg total) into the muscle every 30 (thirty) days.   Luer Lock Safety Syringes 22G X 1 3 ML Misc Generic drug: SYRINGE-NEEDLE (DISP) 3 ML 1 Device by Does not apply route every 30 (thirty) days.  meloxicam  15 MG tablet Commonly known as: MOBIC  Take 1 tablet (15 mg total) by mouth daily.   methocarbamol  500 MG tablet Commonly known as: ROBAXIN  Take 1 tablet (500 mg total) by mouth 2 (two) times daily.   ondansetron  4 MG disintegrating tablet Commonly known as: ZOFRAN -ODT Take 1 tablet (4 mg total) by mouth every 8 (eight) hours as needed.   spironolactone  100 MG tablet Commonly known as: ALDACTONE  Take 1 tablet (100 mg total) by mouth daily.          OBJECTIVE:   PHYSICAL EXAM: VS: There were no  vitals taken for this visit.   EXAM: General: Pt appears well and is in NAD  Neck: General: Supple without adenopathy. Thyroid : Thyroid  size normal.  No goiter or nodules appreciated.  Lungs: Clear with good BS bilat   Heart: Auscultation: RRR.  Extremities:  BL LE: No pretibial edema   Mental Status: Judgment, insight: Intact Orientation: Oriented to time, place, and person Mood and affect: No depression, anxiety, or agitation     DATA REVIEWED:   Latest Reference Range & Units 12/31/23 11:25  Sodium 135 - 145 mmol/L 132 (L)  Potassium 3.5 - 5.1 mmol/L 4.3  Chloride 98 - 111 mmol/L 102  CO2 22 - 32 mmol/L 22  Glucose 70 - 99 mg/dL 877 (H)  BUN 6 - 20 mg/dL 13  Creatinine 9.55 - 8.99 mg/dL 8.95 (H)  Calcium  8.9 - 10.3 mg/dL 9.0  Anion gap 5 - 15  8  Magnesium 1.7 - 2.4 mg/dL 2.0    Latest Reference Range & Units 12/31/23 11:25  GFR, Estimated >60 mL/min >60     Old records , labs and images have been reviewed.   ASSESSMENT / PLAN / RECOMMENDATIONS:   Gender Dysphoria :  -Patient has not been to our clinic in a year and a half, has not had her medications either -I confirm with the patient the pronouns needed to be used, patient confirms that they would like she/her pronouns to be used -Patient has a beard, but states not shaving as waiting for laser hair removal appointment -No point in checking labs today as the patient has not had medications, will recheck on next visit -Refill on spironolactone  and estradiol  was sent today    Medications   Continue estradiol  valuate 20mg /mL to 20 mg (1 mL) monthly Take spironolactone  100 mg twice daily   Follow-up in 6 months  Signed electronically by: Stefano Redgie Butts, MD  [facility]  [facility] [address] [place], [place] [address] Phone: [phone] FAX: [phone]      CC: [facility], [facility] [address] [place] [address] Phone: [phone]   Fax: [phone]   Return to Endocrinology clinic as below: Future Appointments  Date Time Provider Department Center  07/06/2024 11:30 AM Takashi Korol, Donell Redgie, MD [id number] None

## 2024-07-15 ENCOUNTER — Encounter (HOSPITAL_COMMUNITY): Payer: Self-pay

## 2024-07-15 ENCOUNTER — Other Ambulatory Visit: Payer: Self-pay

## 2024-07-15 ENCOUNTER — Emergency Department (HOSPITAL_COMMUNITY)

## 2024-07-15 ENCOUNTER — Emergency Department (HOSPITAL_COMMUNITY)
Admission: EM | Admit: 2024-07-15 | Discharge: 2024-07-15 | Disposition: A | Discharge status: Home / Self Care | Attending: Emergency Medicine | Admitting: Emergency Medicine

## 2024-07-15 DIAGNOSIS — Z21 Asymptomatic human immunodeficiency virus [HIV] infection status: Secondary | ICD-10-CM | POA: Diagnosis not present

## 2024-07-15 DIAGNOSIS — J45909 Unspecified asthma, uncomplicated: Secondary | ICD-10-CM | POA: Insufficient documentation

## 2024-07-15 DIAGNOSIS — R079 Chest pain, unspecified: Secondary | ICD-10-CM | POA: Insufficient documentation

## 2024-07-15 DIAGNOSIS — E119 Type 2 diabetes mellitus without complications: Secondary | ICD-10-CM | POA: Diagnosis not present

## 2024-07-15 DIAGNOSIS — Z5900 Homelessness unspecified: Secondary | ICD-10-CM | POA: Diagnosis not present

## 2024-07-15 DIAGNOSIS — R0602 Shortness of breath: Secondary | ICD-10-CM | POA: Insufficient documentation

## 2024-07-15 LAB — CBC WITH DIFFERENTIAL/PLATELET
Abs Immature Granulocytes: 0.03 K/uL (ref 0.00–0.07)
Basophils Absolute: 0 K/uL (ref 0.0–0.1)
Basophils Relative: 0 %
Eosinophils Absolute: 0 K/uL (ref 0.0–0.5)
Eosinophils Relative: 0 %
HCT: 51.6 % — ABNORMAL HIGH (ref 36.0–46.0)
Hemoglobin: 17.3 g/dL — ABNORMAL HIGH (ref 12.0–15.0)
Immature Granulocytes: 0 %
Lymphocytes Relative: 26 %
Lymphs Abs: 2.3 K/uL (ref 0.7–4.0)
MCH: 30.4 pg (ref 26.0–34.0)
MCHC: 33.5 g/dL (ref 30.0–36.0)
MCV: 90.7 fL (ref 80.0–100.0)
Monocytes Absolute: 0.5 K/uL (ref 0.1–1.0)
Monocytes Relative: 6 %
Neutro Abs: 6 K/uL (ref 1.7–7.7)
Neutrophils Relative %: 68 %
Platelets: 263 K/uL (ref 150–400)
RBC: 5.69 MIL/uL — ABNORMAL HIGH (ref 3.87–5.11)
RDW: 12.8 % (ref 11.5–15.5)
WBC: 8.9 K/uL (ref 4.0–10.5)
nRBC: 0 % (ref 0.0–0.2)

## 2024-07-15 LAB — URINE DRUG SCREEN
Amphetamines: NEGATIVE
Barbiturates: NEGATIVE
Benzodiazepines: NEGATIVE
Cocaine: POSITIVE — AB
Fentanyl: NEGATIVE
Methadone Scn, Ur: NEGATIVE
Opiates: NEGATIVE
Tetrahydrocannabinol: NEGATIVE

## 2024-07-15 LAB — COMPREHENSIVE METABOLIC PANEL WITH GFR
ALT: 78 U/L — ABNORMAL HIGH (ref 0–44)
AST: 61 U/L — ABNORMAL HIGH (ref 15–41)
Albumin: 4.8 g/dL (ref 3.5–5.0)
Alkaline Phosphatase: 82 U/L (ref 38–126)
Anion gap: 16 — ABNORMAL HIGH (ref 5–15)
BUN: 6 mg/dL (ref 6–20)
CO2: 23 mmol/L (ref 22–32)
Calcium: 10.2 mg/dL (ref 8.9–10.3)
Chloride: 96 mmol/L — ABNORMAL LOW (ref 98–111)
Creatinine, Ser: 0.92 mg/dL (ref 0.44–1.00)
GFR, Estimated: >60 mL/min (ref >60)
Glucose, Bld: 127 mg/dL — ABNORMAL HIGH (ref 70–99)
Potassium: 4 mmol/L (ref 3.5–5.1)
Sodium: 135 mmol/L (ref 135–145)
Total Bilirubin: 0.5 mg/dL (ref 0.0–1.2)
Total Protein: 8 g/dL (ref 6.5–8.1)

## 2024-07-15 LAB — TSH: TSH: 3.02 u[IU]/mL (ref 0.350–4.500)

## 2024-07-15 LAB — D-DIMER, QUANTITATIVE: D-Dimer, Quant: <0.27 ug{FEU}/mL (ref 0.00–0.50)

## 2024-07-15 LAB — PRO BRAIN NATRIURETIC PEPTIDE: Pro Brain Natriuretic Peptide: <50 pg/mL (ref <300.0)

## 2024-07-15 LAB — TROPONIN T, HIGH SENSITIVITY
Troponin T High Sensitivity: <15 ng/L (ref 0–19)
Troponin T High Sensitivity: <15 ng/L (ref 0–19)

## 2024-07-15 MED ORDER — AMLODIPINE BESYLATE 5 MG PO TABS: 2.5000 mg | ORAL_TABLET | Freq: Once | ORAL | Status: DC

## 2024-07-15 MED ORDER — LORAZEPAM 2 MG/ML IJ SOLN
0.5000 mg | Freq: Once | INTRAMUSCULAR | Status: AC
  Administered 2024-07-15: 0.5 mg via INTRAVENOUS
  Filled 2024-07-15: qty 1

## 2024-07-15 MED ORDER — AMLODIPINE BESYLATE 2.5 MG PO TABS: 2.5000 mg | ORAL_TABLET | Freq: Every day | ORAL | 0 refills | Status: DC

## 2024-07-15 NOTE — ED Notes (Signed)
 Patient updated on plan of care.  BP currently WNL.  MD aware and no medication to be given prior to discharge

## 2024-07-15 NOTE — ED Provider Notes (Addendum)
 Rhonda Ayers   Rhonda Ayers: [id number] Arrival date & time: 07/15/24  9163     Patient presents with: Chest Pain   Rhonda Ayers is a 43 y.o. adult.   HPI      Rhonda Ayers is a 43yo with history of HIV, adherent to medications, who presents with concern for chest pain and dyspnea.   Reports that they used marijuana at approximately 4 AM.  Approximately 10 to 15 minutes later, developed a chest pain and shortness of breath.  Has never experienced the symptoms before.  Reports the chest pain feels like a pressure but also a sharp pain.  Feels short of breath as well.  Denies any nausea, vomiting, diaphoresis, cough, leg pain or swelling.  No history of DVT, PE, family history of DVT or PE, recent surgery or immobilization, recent long trips, no longer on estrogen. (Previously transgender) No family history of CAD, does have fam hx of DM/htn.  Mild chart reports history of asthma and diabetes, they deny history of this.  Did have a red bull and a coffee this morning to see if that would help with the pain.  Smokes occasional loose cigarettes, denies other drug use.  Is concerned that the Rhonda Ayers might have been laced.    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 Authorizing Provider  amLODipine (NORVASC) 2.5 MG tablet Take 1 tablet (2.5 mg total) by mouth daily. 07/15/24  Yes Rhonda Longs, MD  benzonatate  (TESSALON ) 100 MG capsule Take 1 capsule (100 mg total) by mouth every 8 (eight) hours. Patient not taking: Reported on 01/03/2024 11/15/23   Henderly, Britni A, PA-C  dolutegravir -lamiVUDine (DOVATO ) 50-300 MG tablet Take 1 tablet by mouth daily. 04/01/24   Kuppelweiser, Cassie L, RPH-CPP  Estradiol  Valerate 10 MG/ML OIL Inject 2 mLs (20 mg total) into the muscle every 30 (thirty) days. 03/05/24   Shamleffer, Ibtehal Jaralla, MD  meloxicam  (MOBIC ) 15 MG tablet Take 1 tablet (15 mg total) by mouth daily. 01/27/24   Sikora, Rebecca, DPM  methocarbamol  (ROBAXIN ) 500 MG tablet Take 1 tablet (500 mg total) by mouth 2 (two) times daily. 06/08/23   Logan Ubaldo NOVAK, PA-C  ondansetron  (ZOFRAN -ODT) 4 MG disintegrating tablet Take 1 tablet (4 mg total) by mouth every 8 (eight) hours as needed. 11/15/23   Henderly, Britni A, PA-C  spironolactone  (ALDACTONE ) 100 MG tablet Take 1 tablet (100 mg total) by mouth daily. 01/03/24   Shamleffer, Ibtehal Jaralla, MD  SYRINGE-NEEDLE, DISP, 3 ML (LUER LOCK SAFETY SYRINGES) 22G X 1 3 ML MISC 1 Device by Does not apply route every 30 (thirty) days. 01/03/24  Shamleffer, Ibtehal Jaralla, MD    Allergies: Bee venom, Tramadol , and Tylenol  [acetaminophen ]    Review of Systems  Updated Vital Signs BP (!) 135/94   Pulse 99   Temp 98.1 F (36.7 C) (Oral)   Resp 19   Ht 5' 7 (1.702 m)   Wt 111.1 kg   SpO2 95%   BMI 38.37 kg/m   Physical Exam Vitals and nursing Ayers reviewed.  Constitutional:      General: Rhonda Ayers is not in acute distress.    Appearance: Rhonda Ayers is well-developed. Rhonda Ayers is not diaphoretic.  HENT:     Head: Normocephalic and atraumatic.  Eyes:      Conjunctiva/sclera: Conjunctivae normal.  Cardiovascular:     Rate and Rhythm: Normal rate and regular rhythm.     Heart sounds: Normal heart sounds. No murmur heard.    No friction rub. No gallop.  Pulmonary:     Effort: Pulmonary effort is normal. No respiratory distress.     Breath sounds: Normal breath sounds. No wheezing or rales.  Abdominal:     General: There is no distension.     Palpations: Abdomen is soft.     Tenderness: There is no abdominal tenderness. There is no guarding.  Musculoskeletal:        General: No tenderness.     Cervical back: Normal range of motion.  Skin:    General: Skin is warm and dry.     Findings: No erythema or rash.  Neurological:     Mental Status: Rhonda Ayers is alert and oriented to person, place, and time.     (all labs ordered are listed, but only abnormal results are displayed) Labs Reviewed  CBC WITH DIFFERENTIAL/PLATELET - Abnormal; Notable for the following components:      Result Value   RBC 5.69 (*)    Hemoglobin 17.3 (*)    HCT 51.6 (*)    All other components within normal limits  COMPREHENSIVE METABOLIC PANEL WITH GFR - Abnormal; Notable for the following components:   Chloride 96 (*)    Glucose, Bld 127 (*)    AST 61 (*)    ALT 78 (*)    Anion gap 16 (*)    All other components within normal limits  URINE DRUG SCREEN - Abnormal; Notable for the following components:   Cocaine POSITIVE (*)    All other components within normal limits  PRO BRAIN NATRIURETIC PEPTIDE  D-DIMER, QUANTITATIVE  TSH  TROPONIN T, HIGH SENSITIVITY  TROPONIN T, HIGH SENSITIVITY    EKG: EKG Interpretation Date/Time:  Wednesday July 15 2024 08:48:01 EDT Ventricular Rate:  111 PR Interval:  153 QRS Duration:  83 QT Interval:  330 QTC Calculation: 449 R Axis:   69  Text Interpretation: Sinus tachycardia Probable left atrial enlargement Borderline T abnormalities, inferior leads No significant change since last tracing Confirmed by Rhonda Ayers ([id number]) on 07/15/2024 11:30:42 AM  Radiology: [facility] Chest 2 View Result Date: 07/15/2024 CLINICAL DATA:  Acute chest pain. EXAM: CHEST - 2 VIEW COMPARISON:  January 14, 2024. FINDINGS: The heart size and mediastinal contours are within normal limits. Both lungs are clear. The visualized skeletal structures are unremarkable. IMPRESSION: No active cardiopulmonary disease. Electronically Signed   By: Rhonda Ayers M.D.   On: 07/15/2024 09:32     Procedures   Medications Ordered in the ED  amLODipine (NORVASC) tablet 2.5 mg (has no administration in time range)  LORazepam (ATIVAN) injection 0.5 mg (0.5 mg Intravenous Given 07/15/24  1015)                                     Rhonda Ayers is a 43yo with history of HIV, adherent to medications (CD4 735), who presents with concern for chest pain and dyspnea.   Differential diagnosis for chest pain includes pulmonary embolus, dissection, pneumothorax, pneumonia, ACS, myocarditis, pericarditis.  Low suspicion for aortic dissection by history and exam, normal bilateral pulses, no radiation, normal XR.  EKG was done and evaluate by me and showed borderline ST elevation anteriorly, TW inversions inferiorly.   Chest x-ray was done and evaluated by me and radiology and showed no sign of pneumonia or pneumothorax.   Labs completed and personally evaluated interpreted by me shows no clinically significant anemia, electrolyte abnormalities.  Mild transaminitis noted previously. Troponin negative.  D-dimer negative, low risk wells.  TSH ordered given tachycardia shows no evidence of hyperthyroidism.  UDS completed after discussion with patient given concern that THC was laced and presence of tachycardia and does shows positive for cocaine.  HR improved in the ED as did blood pressures-and suspect these changes secondary to cocaine, red bull and coffee.  Rhonda Ayers reports blood pressures have been elevated in outpatient setting--initially did write a  prescription for 2.5 mg of amlodipine given hypertensive blood pressures in the emergency department, however on recheck his blood pressures are 120s over 80s and recommend continued PCP follow-up prior to initiation of antihypertensives.  Observed in ED with repeat troponin normal, doubt ACS, myocarditis.    Recommend follow up with PCP, avoidance of drug use. Discussed reasons to return to ED. Patient discharged in stable condition with understanding of reasons to return.        Final diagnoses:  Chest pain, unspecified type          Rhonda Longs, MD 07/15/24 1308    Rhonda Longs, MD 07/15/24 1320

## 2024-07-15 NOTE — ED Triage Notes (Signed)
 Pt arrives by EMS from home per EMS chest pain started at 4am that woke pt out of sleep, pt states couldn't get relief. Per pt drank a redbull and coffee this morning at 6am then experienced jittersand being anxious, per ems HR 115 BP 142/102  96% room air

## 2024-08-05 ENCOUNTER — Other Ambulatory Visit: Payer: Self-pay

## 2024-08-05 ENCOUNTER — Encounter (HOSPITAL_COMMUNITY): Payer: Self-pay

## 2024-08-05 ENCOUNTER — Emergency Department (HOSPITAL_COMMUNITY)
Admission: EM | Admit: 2024-08-05 | Discharge: 2024-08-05 | Disposition: A | Discharge status: Home / Self Care | Attending: Emergency Medicine | Admitting: Emergency Medicine

## 2024-08-05 ENCOUNTER — Emergency Department (HOSPITAL_COMMUNITY)

## 2024-08-05 DIAGNOSIS — R0789 Other chest pain: Secondary | ICD-10-CM | POA: Insufficient documentation

## 2024-08-05 DIAGNOSIS — J45909 Unspecified asthma, uncomplicated: Secondary | ICD-10-CM | POA: Insufficient documentation

## 2024-08-05 DIAGNOSIS — R519 Headache, unspecified: Secondary | ICD-10-CM | POA: Diagnosis not present

## 2024-08-05 DIAGNOSIS — I10 Essential (primary) hypertension: Secondary | ICD-10-CM | POA: Diagnosis not present

## 2024-08-05 DIAGNOSIS — E119 Type 2 diabetes mellitus without complications: Secondary | ICD-10-CM | POA: Diagnosis not present

## 2024-08-05 DIAGNOSIS — Z21 Asymptomatic human immunodeficiency virus [HIV] infection status: Secondary | ICD-10-CM | POA: Insufficient documentation

## 2024-08-05 DIAGNOSIS — R030 Elevated blood-pressure reading, without diagnosis of hypertension: Secondary | ICD-10-CM

## 2024-08-05 DIAGNOSIS — R079 Chest pain, unspecified: Secondary | ICD-10-CM | POA: Diagnosis present

## 2024-08-05 LAB — CBC
HCT: 48.7 % — ABNORMAL HIGH (ref 36.0–46.0)
Hemoglobin: 17.2 g/dL — ABNORMAL HIGH (ref 12.0–15.0)
MCH: 32.3 pg (ref 26.0–34.0)
MCHC: 35.3 g/dL (ref 30.0–36.0)
MCV: 91.5 fL (ref 80.0–100.0)
Platelets: 265 K/uL (ref 150–400)
RBC: 5.32 MIL/uL — ABNORMAL HIGH (ref 3.87–5.11)
RDW: 12.7 % (ref 11.5–15.5)
WBC: 5 K/uL (ref 4.0–10.5)
nRBC: 0.4 % — ABNORMAL HIGH (ref 0.0–0.2)

## 2024-08-05 LAB — RAPID URINE DRUG SCREEN, HOSP PERFORMED
Amphetamines: NOT DETECTED
Barbiturates: NOT DETECTED
Benzodiazepines: NOT DETECTED
Cocaine: POSITIVE — AB
Opiates: NOT DETECTED
Tetrahydrocannabinol: NOT DETECTED

## 2024-08-05 LAB — BASIC METABOLIC PANEL WITH GFR
Anion gap: 13 (ref 5–15)
BUN: 13 mg/dL (ref 6–20)
CO2: 23 mmol/L (ref 22–32)
Calcium: 9.1 mg/dL (ref 8.9–10.3)
Chloride: 97 mmol/L — ABNORMAL LOW (ref 98–111)
Creatinine, Ser: 0.98 mg/dL (ref 0.44–1.00)
GFR, Estimated: >60 mL/min (ref >60)
Glucose, Bld: 148 mg/dL — ABNORMAL HIGH (ref 70–99)
Potassium: 4 mmol/L (ref 3.5–5.1)
Sodium: 133 mmol/L — ABNORMAL LOW (ref 135–145)

## 2024-08-05 LAB — TROPONIN I (HIGH SENSITIVITY)
Troponin I (High Sensitivity): 8 ng/L (ref <18)
Troponin I (High Sensitivity): 10 ng/L (ref <18)

## 2024-08-05 NOTE — ED Triage Notes (Signed)
 Pt was BIB [facility] from home with complaints of 10/10 Chest pain that felt sharp with pressure. Had 324 ASA enroute and then 2 doses of nitroglycerin. Hypertensive in 190s. Inverted T wave, ST depression, inferior lateral.   154/92 94 HR 97%RA 18GA LAC .

## 2024-08-05 NOTE — ED Provider Notes (Signed)
 [place] [facility]ERGENCY DEPARTMENT AT [facility] Provider Note   CSN: [id number] Arrival date & time: 08/05/24  1840     Patient presents with: Chest Pain   Rhonda Ayers is a 43 y.o. adult.   The history is provided by the patient and medical records. No language interpreter was used.  Chest Pain Pain location:  L chest Pain quality: aching   Pain radiates to:  Neck (head) Pain severity:  Moderate Onset quality:  Gradual Duration:  1 month Timing:  Intermittent Progression:  Waxing and waning Chronicity:  Recurrent Context: not trauma   Relieved by:  Nothing Worsened by:  Nothing Associated symptoms: headache   Associated symptoms: no abdominal pain, no altered mental status, no back pain, no cough, no diaphoresis, no fatigue, no fever, no lower extremity edema, no nausea, no near-syncope, no numbness, no palpitations, no shortness of breath, no vomiting and no weakness        Prior to Admission medications   Medication Sig Start Date End Date Taking? Authorizing Provider  benzonatate  (TESSALON ) 100 MG capsule Take 1 capsule (100 mg total) by mouth every 8 (eight) hours. Patient not taking: Reported on 01/03/2024 11/15/23   Henderly, Britni A, PA-C  dolutegravir -lamiVUDine (DOVATO ) 50-300 MG tablet Take 1 tablet by mouth daily. 04/01/24   Kuppelweiser, Cassie L, RPH-CPP  Estradiol  Valerate 10 MG/ML OIL Inject 2 mLs (20 mg total) into the muscle every 30 (thirty) days. 03/05/24   Shamleffer, Ibtehal Jaralla, MD  meloxicam  (MOBIC ) 15 MG tablet Take 1 tablet (15 mg total) by mouth daily. 01/27/24   Sikora, Rebecca, DPM  methocarbamol  (ROBAXIN ) 500 MG tablet Take 1 tablet (500 mg total) by mouth 2 (two) times daily. 06/08/23   Logan Ubaldo NOVAK, PA-C  ondansetron  (ZOFRAN -ODT) 4 MG disintegrating tablet Take 1 tablet (4 mg total) by mouth every 8 (eight) hours as needed. 11/15/23   Henderly, Britni A, PA-C  spironolactone  (ALDACTONE ) 100 MG tablet Take 1 tablet (100 mg  total) by mouth daily. 01/03/24   Shamleffer, Ibtehal Jaralla, MD  SYRINGE-NEEDLE, DISP, 3 ML (LUER LOCK SAFETY SYRINGES) 22G X 1 3 ML MISC 1 Device by Does not apply route every 30 (thirty) days. 01/03/24   Shamleffer, Ibtehal Jaralla, MD    Allergies: Bee venom, Tramadol , and Tylenol  [acetaminophen ]    Review of Systems  Constitutional:  Negative for chills, diaphoresis, fatigue and fever.  HENT:  Negative for congestion.   Eyes:  Positive for visual disturbance (transient left blurry vision). Negative for pain.  Respiratory:  Negative for cough, chest tightness and shortness of breath.   Cardiovascular:  Positive for chest pain. Negative for palpitations and near-syncope.  Gastrointestinal:  Negative for abdominal pain, constipation, diarrhea, nausea and vomiting.  Genitourinary:  Negative for dysuria.  Musculoskeletal:  Positive for neck pain. Negative for back pain and neck stiffness.  Skin:  Negative for rash and wound.  Neurological:  Positive for headaches. Negative for seizures, syncope, weakness, light-headedness and numbness.  Psychiatric/Behavioral:  Negative for agitation and confusion.   All other systems reviewed and are negative.   Updated Vital Signs BP 123/79   Pulse 90   Temp 97.7 F (36.5 C) (Oral)   Resp 17   Ht 5' 6 (1.676 m)   Wt 104.3 kg   SpO2 96%   BMI 37.12 kg/m   Physical Exam Vitals and nursing note reviewed.  Constitutional:      General: She is not in acute distress.    Appearance:  She is well-developed. She is not ill-appearing, toxic-appearing or diaphoretic.  HENT:     Head: Normocephalic and atraumatic.     Right Ear: External ear normal.     Left Ear: External ear normal.     Nose: Nose normal.     Mouth/Throat:     Pharynx: No oropharyngeal exudate.  Eyes:     Conjunctiva/sclera: Conjunctivae normal.     Pupils: Pupils are equal, round, and reactive to light.  Neck:   Cardiovascular:     Rate and Rhythm: Normal rate.     Heart  sounds: Normal heart sounds. No murmur heard. Pulmonary:     Effort: No respiratory distress.     Breath sounds: No stridor.  Chest:     Chest wall: Tenderness present.  Abdominal:     General: There is no distension.     Tenderness: There is no abdominal tenderness. There is no rebound.  Musculoskeletal:     Cervical back: Normal range of motion and neck supple. Muscular tenderness present. No spinous process tenderness.     Right lower leg: No tenderness. No edema.     Left lower leg: No tenderness. No edema.  Skin:    General: Skin is warm.     Findings: No erythema or rash.  Neurological:     General: No focal deficit present.     Mental Status: She is alert and oriented to person, place, and time.     Motor: No abnormal muscle tone.     Coordination: Coordination normal.     Deep Tendon Reflexes: Reflexes are normal and symmetric.  Psychiatric:        Mood and Affect: Mood normal.     (all labs ordered are listed, but only abnormal results are displayed) Labs Reviewed  CBC - Abnormal; Notable for the following components:      Result Value   RBC 5.32 (*)    Hemoglobin 17.2 (*)    HCT 48.7 (*)    nRBC 0.4 (*)    All other components within normal limits  RAPID URINE DRUG SCREEN, HOSP PERFORMED - Abnormal; Notable for the following components:   Cocaine POSITIVE (*)    All other components within normal limits  BASIC METABOLIC PANEL WITH GFR - Abnormal; Notable for the following components:   Sodium 133 (*)    Chloride 97 (*)    Glucose, Bld 148 (*)    All other components within normal limits  TROPONIN I (HIGH SENSITIVITY)  TROPONIN I (HIGH SENSITIVITY)    EKG: EKG Interpretation Date/Time:  Wednesday August 05 2024 18:50:38 EDT Ventricular Rate:  91 PR Interval:  150 QRS Duration:  82 QT Interval:  364 QTC Calculation: 447 R Axis:   118  Text Interpretation: Normal sinus rhythm Right axis deviation Minimal voltage criteria for LVH, may be normal variant  ( Cornell product ) Abnormal ECG When compared with ECG of 15-Jul-2024 08:48, PREVIOUS ECG IS PRESENT when compared top rior, similar appearance No STEMI Confirmed by Ginger Barefoot ([id number]) on 08/05/2024 8:01:19 PM  Radiology: DG Chest 2 View Result Date: 08/05/2024 EXAM: 2 VIEW(S) XRAY OF THE CHEST 08/05/2024 07:52:00 PM COMPARISON: 07/15/2024 CLINICAL HISTORY: Chest pain. Patient was brought in by [facility] from home with complaints of 10/10 chest pain that felt sharp with pressure. Had 324 mg acetylsalicylic acid en route and then 2 doses of nitroglycerin. Hypertensive in 190s. Inverted T wave, ST depression, inferior lateral. FINDINGS: LUNGS AND PLEURA: No focal pulmonary opacity. No  pulmonary edema. No pleural effusion. No pneumothorax. HEART AND MEDIASTINUM: No acute abnormality of the cardiac and mediastinal silhouettes. BONES AND SOFT TISSUES: No acute osseous abnormality. IMPRESSION: 1. No acute cardiopulmonary process. Electronically signed by: Norman Gatlin MD 08/05/2024 07:54 PM EDT RP Workstation: [id number]     Procedures   Medications Ordered in the ED - No data to display                                  Medical Decision Making Amount and/or Complexity of Data Reviewed Labs: ordered. Radiology: ordered.    Rhonda Ayers is a 43 y.o. adult with a past medical history significant for HIV, diabetes, asthma, depression, and according to patient hypertension who presents with headache, neck pain, chest pain, and elevated blood pressure.  According to patient, they have been having chest pain on and off for the last month or 2 that comes and goes.  Patient says the blood pressure has been elevated up into the 190s today in the setting of this pain.  They report the pain was in the head, neck and going to the left chest.  It was quite tender to the touch.  No recent trauma.  Patient denied shortness of breath, nausea, vomiting, or diaphoresis.  Pain did not go down the arms or legs.   No pain in the abdomen or lower back.  No rash reported to suggest shingles.  Patient did report some left-sided blurry vision that was transient and resolved.  Otherwise patient denies other drug use.  On exam, lungs clear.  Chest is quite tender as of the left lateral neck and left back in the paraspinal area.  There is muscle spasm palpated.  No rash seen to suggest shingles.  No focal neurologic deficits.  Patient denies any vision changes and had normal extract movements.  Symmetric smile.  Clear speech.  No carotid bruit.  Intact pulses in extremities.  Abdomen nontender.  Normal bowel sounds.  Patient otherwise well-appearing.  EKG does not show STEMI.  Patient had some workup starting in triage that had a normal troponin initially.  Blood pressure drastically improved while here and blood pressure during my evaluation was in the 120s systolic.  Initial labs again returned and the metabolic panel did not show AKI and there was no anemia or leukocytosis.  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  Chest x-ray did not show pneumonia wide mediastinum, or infection.  Patient otherwise resting.  Due to the pain in the head neck and elevated blood pressure reportedly, we were going to get a CT of the head and neck to look for dissection however patient tells me they need to leave and does not want to wait for imaging.  They understand return precautions and follow-up instructions and they report that they are going to follow-up with the PCP tomorrow.  They have no other questions or concerns and symptoms completely resolved.  Patient discharged in good condition with understanding return precautions and follow-up instructions.                   Final diagnoses:  Atypical chest pain  Nonintractable headache, unspecified chronicity pattern, unspecified headache type  Elevated blood pressure reading    ED Discharge Orders     None       Clinical Impression: 1. Atypical chest pain   2. Nonintractable headache,  unspecified chronicity pattern, unspecified headache type   3. Elevated blood pressure reading     Disposition: Discharge  Condition: Good  I have discussed the results, Dx and Tx plan with the pt(& family if present). He/she/they expressed understanding and agree(s) with the plan. Discharge instructions discussed at great length. Strict return precautions discussed and pt &/or family have verbalized understanding of the instructions. No further questions at time of discharge.    Discharge Medication List as of 08/05/2024 10:34 PM      Follow Up: Center, [facility] [address] [place] [address] [phone]     [place] [facility] Department at [facility] [address] [place]  [address] [phone]        Mickayla Trouten, Lonni PARAS, MD 08/05/24 2330

## 2024-08-05 NOTE — Discharge Instructions (Signed)
 Your history, exam, and evaluation today seem consistent with some transient headache and chest discomfort in the setting of your elevated blood pressure.  Your blood pressure improved as you were here and we had a shared decision-making conversation on management.  Initially wanted to get CT angiography imaging of your head and neck given the pain in your head, neck going towards your chest in the setting of elevated blood pressures however you needed to leave to catch a bus and did not want to wait for these pictures to be completed.  We also wanted to get a metabolic panel to look at your kidney function and electrolytes but you did not want to stay to have that completed.  Your first troponin which is your heart enzyme was reassuring and this means you likely are not having a cardiac or heart cause of your pain at this time.  Your vital signs were reassuring and the nearly 4 hours you were here in the emergency department.  We do feel you are safe for discharge home although we have not yet completed your full workup.  During our discussion we agreed to discharge you but please follow-up with your primary doctor tomorrow and if any symptoms change or worsen acutely, return to the nearest emergency department for further workup.

## 2024-08-05 NOTE — ED Notes (Addendum)
 Pt was observed yelling and screaming at staff as he was being wheeled back to his triage room from imaging.  He was yelling I don't care if they are taking care of a critical pt, he just got here and I have been here an hour....  That's bullshit  RN when and attempted to speak to Pt who began yelling at RN that nobody had been monitoring me.  CRN had walked by room several time and observed him speaking loudly on the phone and walking around the room.  RN attempted to explain the situation however pt continued to yell at RN using obscenities and calling RN names.  RN walked away however he followed RN back to the charge RN desk

## 2024-08-06 ENCOUNTER — Ambulatory Visit

## 2024-08-06 ENCOUNTER — Other Ambulatory Visit (HOSPITAL_COMMUNITY)
Admission: RE | Admit: 2024-08-06 | Discharge: 2024-08-06 | Disposition: A | Discharge status: Home / Self Care | Source: Ambulatory Visit | Attending: Infectious Diseases | Admitting: Infectious Diseases

## 2024-08-06 ENCOUNTER — Ambulatory Visit (INDEPENDENT_AMBULATORY_CARE_PROVIDER_SITE_OTHER): Admitting: Infectious Diseases

## 2024-08-06 ENCOUNTER — Encounter: Payer: Self-pay | Admitting: Infectious Diseases

## 2024-08-06 ENCOUNTER — Other Ambulatory Visit: Payer: Self-pay

## 2024-08-06 VITALS — BP 156/109 | HR 96 | Temp 97.9°F | Ht 67.0 in | Wt 284.0 lb

## 2024-08-06 DIAGNOSIS — K5900 Constipation, unspecified: Secondary | ICD-10-CM | POA: Diagnosis not present

## 2024-08-06 DIAGNOSIS — R635 Abnormal weight gain: Secondary | ICD-10-CM

## 2024-08-06 DIAGNOSIS — Z113 Encounter for screening for infections with a predominantly sexual mode of transmission: Secondary | ICD-10-CM

## 2024-08-06 DIAGNOSIS — B20 Human immunodeficiency virus [HIV] disease: Secondary | ICD-10-CM | POA: Diagnosis present

## 2024-08-06 DIAGNOSIS — E119 Type 2 diabetes mellitus without complications: Secondary | ICD-10-CM | POA: Diagnosis not present

## 2024-08-06 DIAGNOSIS — F17210 Nicotine dependence, cigarettes, uncomplicated: Secondary | ICD-10-CM

## 2024-08-06 DIAGNOSIS — I10 Essential (primary) hypertension: Secondary | ICD-10-CM | POA: Diagnosis not present

## 2024-08-06 DIAGNOSIS — Z79899 Other long term (current) drug therapy: Secondary | ICD-10-CM

## 2024-08-06 DIAGNOSIS — R079 Chest pain, unspecified: Secondary | ICD-10-CM

## 2024-08-06 DIAGNOSIS — Z1212 Encounter for screening for malignant neoplasm of rectum: Secondary | ICD-10-CM

## 2024-08-06 MED ORDER — LISINOPRIL 10 MG PO TABS: 10.0000 mg | ORAL_TABLET | Freq: Every day | ORAL | 1 refills | Status: DC

## 2024-08-06 MED ORDER — ADULT BLOOD PRESSURE CUFF LG KIT: 1.0000 | PACK | Freq: Once | 0 refills | Status: AC

## 2024-08-06 MED ORDER — DOVATO 50-300 MG PO TABS: 1.0000 | ORAL_TABLET | Freq: Every day | ORAL | 5 refills | Status: AC

## 2024-08-06 MED ORDER — NICOTINE 7 MG/24HR TD PT24: 7.0000 mg | MEDICATED_PATCH | Freq: Every day | TRANSDERMAL | 0 refills | Status: DC

## 2024-08-06 NOTE — Patient Instructions (Addendum)
 START:  Lisinopril 10 mg tab - one tab once a day *this is for blood pressure control  *can sometimes cause an annoying cough or swelling of the lip / tongue - Stop taking if you notice these side effects.   STOP the estrogen injections for now until we get your blood pressure better controlled  STOP the Spironolactone  for now   CONTINUE:  Dovato  once daily   Referrals for :  Cardiology and GI placed today   Please call [facility] to see if you can get a new patient visit.   [facility] [place] Address: [address] [place], [place], [place] [address] Phone: [phone]  Start checking your blood pressure a few times a week and record the readings - goal is < 130/80

## 2024-08-06 NOTE — Progress Notes (Signed)
 Subjective:    Patient ID: Rhonda Ayers, adult    DOB: June 10, 1981, 43 y.o.   MRN: [id number]   Discussed the use of AI scribe software for clinical note transcription with the patient, who gave verbal consent to proceed.  History of Present Illness   Rhonda Ayers Special is a 43 year old female with HIV who presents with atypical chest pain.  She has been experiencing atypical chest pain, which led to an emergency department visit yesterday. Her blood pressure was significantly elevated at 198/116 mmHg during her emergency department visit, and she recalls receiving two doses of nitroglycerin, after which her blood pressure decreased to 123 mmHg. She is concerned about her blood pressure, noting it has been elevated for the past two to three months. No current chest or neck pain during exercise.  She denies cocaine use but suspects her marijuana was laced, as she felt different after smoking it. She smoked two blunts on October 1st and subsequently felt unwell, leading to an emergency department visit where abnormal heart levels were noted. She has been experiencing headaches and numbness on one side of her head, which she describes as traveling down and stopping at a certain point.  She has a history of hormone therapy, which she discontinued due to concerns about her heart. Since stopping the hormones, she has experienced weight fluctuations and issues with her blood pressure. She exercises regularly and feels good after workouts, without experiencing chest or neck pain.  Her family history includes diabetes and heart disease, with both parents having diabetes and a cousin who passed away from heart-related issues. She is concerned about her own risk of diabetes and high blood pressure.  She is dealing with significant stress and mental health issues, including family problems and the loss of a cousin. She has been working excessively, which has affected her mental health.  She is  currently taking Dovato  for her HIV and has been managing her condition well with this medication.       Allergies  Allergen Reactions   Bee Venom Swelling   Tramadol  Other (See Comments)    shakes   Tylenol  [Acetaminophen ]      Outpatient Medications Prior to Visit  Medication Sig Dispense Refill   dolutegravir -lamiVUDine (DOVATO ) 50-300 MG tablet Take 1 tablet by mouth daily. 30 tablet 5   Estradiol  Valerate 10 MG/ML OIL Inject 2 mLs (20 mg total) into the muscle every 30 (thirty) days. 15 mL 2   ondansetron  (ZOFRAN -ODT) 4 MG disintegrating tablet Take 1 tablet (4 mg total) by mouth every 8 (eight) hours as needed. 20 tablet 0   spironolactone  (ALDACTONE ) 100 MG tablet Take 1 tablet (100 mg total) by mouth daily. 180 tablet 3   SYRINGE-NEEDLE, DISP, 3 ML (LUER LOCK SAFETY SYRINGES) 22G X 1 3 ML MISC 1 Device by Does not apply route every 30 (thirty) days. 10 each 3   benzonatate  (TESSALON ) 100 MG capsule Take 1 capsule (100 mg total) by mouth every 8 (eight) hours. (Patient not taking: Reported on 08/06/2024) 21 capsule 0   meloxicam  (MOBIC ) 15 MG tablet Take 1 tablet (15 mg total) by mouth daily. (Patient not taking: Reported on 08/06/2024) 30 tablet 0   methocarbamol  (ROBAXIN ) 500 MG tablet Take 1 tablet (500 mg total) by mouth 2 (two) times daily. (Patient not taking: Reported on 08/06/2024) 20 tablet 0   No facility-administered medications prior to visit.     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  Eyes:  Negative for visual disturbance.  Respiratory:  Negative for cough and shortness of breath.   Cardiovascular:  Negative for chest pain and leg swelling.  Gastrointestinal:  Negative for abdominal pain, diarrhea and nausea.  Genitourinary:  Negative for dysuria, genital sores and penile discharge.  Musculoskeletal:  Negative for joint swelling.  Skin:  Negative for color change and rash.  Neurological:  Negative for dizziness and headaches.  Hematological:  Negative for adenopathy.  Psychiatric/Behavioral:  Negative for sleep disturbance. The patient is not nervous/anxious.         Objective:    BP (!) 156/109   Pulse 96   Temp 97.9 F (36.6 C) (Temporal)   Ht 5' 7 (1.702 m)   Wt 284 lb (128.8 kg)   SpO2 95%   BMI 44.48 kg/m  Nursing note and vital signs reviewed.   Physical Exam Constitutional:      Appearance: Normal appearance. She is not ill-appearing.  HENT:     Head: Normocephalic.     Mouth/Throat:     Mouth: Mucous membranes are moist.     Pharynx: Oropharynx is clear.  Eyes:     General: No scleral icterus. Pulmonary:     Effort: Pulmonary effort is normal.  Musculoskeletal:        General: Normal range of motion.     Cervical back: Normal range of motion.  Skin:    Coloration: Skin is not jaundiced or pale.  Neurological:     Mental Status: She is alert and oriented to person, place, and time.  Psychiatric:        Mood and Affect: Mood  normal.        Judgment: Judgment normal.         Assessment & Plan:     HIV infection - HIV infection is well-managed with a CD4 count of 735. She is tolerating Dovato  well. Viral load was in the undetectable range earlier this year.  - Ensure Dovato  refills are available through [employer] in [place]. - HIV rna and VL today  - Anal cancer screening today  - other care needs prioritized today   Hypertension, uncontrolled - Hypertension is uncontrolled with recent readings as high as 198/116 mmHg. Potential exacerbation due to involuntary cocaine exposure and hormone therapy. Family history of diabetes and heart disease increases cardiovascular risk. - Prescribe lisinopril for blood pressure management. - Order a blood pressure cuff for home monitoring. - Refer to a cardiologist  for further evaluation. - Advise to monitor blood pressure at home and report readings. - Hold off on estrogen therapy until blood pressure is better controlled.  Diabetes mellitus - Suspicion of diabetes mellitus given family history and symptoms. Blood sugar management is crucial due to its interplay with hypertension. - Check blood sugar levels today. - Refer to primary care for diabetes management.  Obesity - Weight fluctuation potentially exacerbated by cessation of hormone therapy. Weight management is important due to its impact on blood pressure and potential diabetes. - Discuss potential use of Mounjaro or Ozempic for weight management and diabetes treatment. - Encourage regular exercise and weight monitoring.  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 - Educate on the risks of cocaine exposure and its impact on blood pressure.  Nicotine dependence, cigarettes - Nicotine dependence with a desire to quit smoking. - Prescribe 7 mg nicotine patches to aid smoking  cessation (only smoking a few cigarettes a day) - Educate on the use of nicotine patches and potential side effects  Rectal pain and constipation - Rectal pain and constipation with concern for potential underlying gastrointestinal issues. - Refer to gastroenterology for evaluation and potential colonoscopy - Anal cancer screening collected today   Screening for sexually transmitted infections Routine screening for sexually transmitted infections is requested. - Perform STI screening today.  Visit duration: 31 minutes      Orders Placed This Encounter  Procedures   HIV 1 RNA quant-no reflex-bld   T-helper cells (CD4) count   RPR   Hemoglobin A1c   Ambulatory referral to Cardiology    Referral Priority:   Routine    Referral Type:   Consultation    Referral Reason:   Specialty Services Required    Number of Visits Requested:   1   Ambulatory referral to Gastroenterology    Referral Priority:   Routine    Referral Type:   Consultation    Referral Reason:   Specialty Services Required    Number of Visits Requested:   1   Meds ordered this encounter  Medications   nicotine (NICODERM CQ - DOSED IN MG/24 HR) 7 mg/24hr patch    Sig: Place 1 patch (7 mg total) onto the skin daily.    Dispense:  14 patch    Refill:  0   dolutegravir -lamiVUDine (DOVATO ) 50-300 MG tablet    Sig: Take 1 tablet by mouth daily.    Dispense:  30 tablet    Refill:  5    Prescription Type::   Renewal   Blood Pressure Monitoring (ADULT BLOOD PRESSURE CUFF LG) KIT    Sig: 1 each by Does not apply route once for 1 dose.    Dispense:  1 kit    Refill:  0   lisinopril (ZESTRIL) 10 MG tablet    Sig: Take 1 tablet (10 mg total) by mouth daily.    Dispense:  30 tablet    Refill:  1   Return in about 1 month (around 09/06/2024).   Corean Fireman, MSN, NP-C [facility]  [email or web address] Pager: [phone] Office: [phone] [facility]  [place]: [phone]

## 2024-08-07 ENCOUNTER — Telehealth: Payer: Self-pay | Admitting: Infectious Diseases

## 2024-08-07 ENCOUNTER — Other Ambulatory Visit: Payer: Self-pay

## 2024-08-07 ENCOUNTER — Telehealth: Payer: Self-pay | Admitting: Internal Medicine

## 2024-08-07 ENCOUNTER — Encounter: Payer: Self-pay | Admitting: Internal Medicine

## 2024-08-07 ENCOUNTER — Ambulatory Visit: Admitting: Internal Medicine

## 2024-08-07 ENCOUNTER — Other Ambulatory Visit: Payer: Self-pay | Admitting: Infectious Diseases

## 2024-08-07 DIAGNOSIS — I10 Essential (primary) hypertension: Secondary | ICD-10-CM

## 2024-08-07 LAB — T-HELPER CELLS (CD4) COUNT (NOT AT ARMC)
CD4 % Helper T Cell: 41 % (ref 33–65)
CD4 T Cell Abs: 771 /uL (ref 400–1790)

## 2024-08-07 LAB — CYTOLOGY, (ORAL, ANAL, URETHRAL) ANCILLARY ONLY
Chlamydia: NEGATIVE
Chlamydia: NEGATIVE
Comment: NEGATIVE
Comment: NEGATIVE
Comment: NORMAL
Comment: NORMAL
Neisseria Gonorrhea: NEGATIVE
Neisseria Gonorrhea: NEGATIVE

## 2024-08-07 LAB — URINE CYTOLOGY ANCILLARY ONLY
Chlamydia: NEGATIVE
Comment: NEGATIVE
Comment: NORMAL
Neisseria Gonorrhea: NEGATIVE

## 2024-08-07 MED ORDER — LISINOPRIL 10 MG PO TABS: 10.0000 mg | ORAL_TABLET | Freq: Every day | ORAL | 1 refills | Status: DC

## 2024-08-07 MED ORDER — NICOTINE 7 MG/24HR TD PT24: 7.0000 mg | MEDICATED_PATCH | Freq: Every day | TRANSDERMAL | 1 refills | Status: AC

## 2024-08-07 NOTE — Telephone Encounter (Signed)
 Patient scheduled for an appointment on Friday 10.2.25 @ 10:30 AM. Per current provider if the patient cancelled again we were not to reschedule. Patient is being dismissed by Dr. Sam - re: cancellation and no show history.    Patient was advised of this and still wanted to cancel.  Patient was not happy with this situation and advised that she had filed a grievance with [facility] and will be filing another because she feels like the provider discriminates against her based on race and transgender status.  Patient was sent a  letter on 09.22.2025 to advise about missing appointments and continuing care (see letters)  Patient did request a transfer of care within office and I have sent the request to Dr. Dartha.  I will call patient back once the transfer of care request is addressed.

## 2024-08-07 NOTE — Telephone Encounter (Signed)
 Special called requesting to speak with a nurse. She stated her medication was sent to the wrong pharmacy and was supposed to all go to the specialty pharmacy in [place]. She is now hoping to have it sent to the [facility] on [address] so she can get it today and begin monitoring her high blood pressure. Pt can be reached at [phone]

## 2024-08-07 NOTE — Progress Notes (Deleted)
 Name: Rhonda Ayers  MRN/ DOB: [id number], 05-29-1981    Age/ Sex: 43 y.o., adult     PCP: [facility]   Reason for Endocrinology Evaluation: Gender Dysphoria      Initial Endocrinology Clinic Visit: 09/21/2021    PATIENT IDENTIFIER: Rhonda Ayers is a 43 y.o., adult with a past medical history of HIV, gender dysphoria. She has followed with [facility] since 09/21/2021 for consultative assistance with management of her gender dysphoria.      HISTORICAL SUMMARY: The patient was diagnosed with gender dysphoria and has been on gender affirming therapy since 2020.  Estrogen in 2022  No h/o DVT, dyslipidemia, liver disease, kidney disease, migraine, sleep apnea, heart disease, CVA, acne, diabetes, HTN, epilepsy, gallbladder disease, blood disorders, osteoporosis, or cancer.  SUBJECTIVE:    Today (08/07/2024):  Rhonda Ayers is here for a follow-up on gender dysphoria.   Patient continues with facial hair growth, has pending laser hair removal Denies nausea or vomiting  Denies constipation or diarrhea  Denies LE edema  Denies acne  Breast tissue is stable   Medications Estradiol  valuate 20 mg (1 mL) monthly   Spironolactone  100 mg, BID     HISTORY:  Past Medical History:  Past Medical History:  Diagnosis Date   Asthma    Carpal tunnel syndrome 07/03/2016   Chest pain 05/13/2023   Constipation 07/05/2023   Depression 07/03/2016   Gonorrhea 08/31/2020   Hand laceration 09/01/2018   Health care maintenance 01/28/2017   Health care maintenance 01/28/2017   HIV disease (HCC) 01/31/2015   HIV infection (HCC)    Homelessness 09/01/2018   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 Her smoking use included cigarettes. She has never used smokeless tobacco. She reports that she does not currently use alcohol. She reports current drug use. Drug: Marijuana. Family History:  Family History  Problem Relation Age of Onset   Diabetes Mother      HOME MEDICATIONS: Allergies as of 08/07/2024       Reactions   Bee Venom Swelling   Tramadol  Other (See Comments)   shakes   Tylenol  Adelphus.[place] ]         Medication List        Accurate as of August 07, 2024  7:19 AM. If you have any questions, ask your nurse or doctor.          Dovato  50-300 MG tablet Generic drug: dolutegravir -lamiVUDine Take 1 tablet by mouth daily.   Estradiol  Valerate 10 MG/ML Oil Inject 2 mLs (20 mg total) into the muscle every 30 (thirty) days.   lisinopril 10 MG tablet Commonly known as: Zestril Take 1 tablet (10 mg total) by mouth daily.   Luer Lock Safety Syringes 22G X 1 3 ML Misc Generic drug: SYRINGE-NEEDLE (DISP) 3 ML 1 Device by Does not apply route every 30 (thirty) days.   nicotine 7 mg/24hr patch Commonly  known as: NICODERM CQ - dosed in mg/24 hr Place 1 patch (7 mg total) onto the skin daily.   ondansetron  4 MG disintegrating tablet Commonly known as: ZOFRAN -ODT Take 1 tablet (4 mg total) by mouth every 8 (eight) hours as needed.          OBJECTIVE:   PHYSICAL EXAM: VS: There were no vitals taken for this visit.   EXAM: General: Pt appears well and is in NAD  Neck: General: Supple without adenopathy. Thyroid : Thyroid  size normal.  No goiter or nodules appreciated.  Lungs: Clear with good BS bilat   Heart: Auscultation: RRR.  Extremities:  BL  LE: No pretibial edema   Mental Status: Judgment, insight: Intact Orientation: Oriented to time, place, and person Mood and affect: No depression, anxiety, or agitation     DATA REVIEWED:   Latest Reference Range & Units 12/31/23 11:25  Sodium 135 - 145 mmol/L 132 (L)  Potassium 3.5 - 5.1 mmol/L 4.3  Chloride 98 - 111 mmol/L 102  CO2 22 - 32 mmol/L 22  Glucose 70 - 99 mg/dL 877 (H)  BUN 6 - 20 mg/dL 13  Creatinine 9.55 - 8.99 mg/dL 8.95 (H)  Calcium  8.9 - 10.3 mg/dL 9.0  Anion gap 5 - 15  8  Magnesium 1.7 - 2.4 mg/dL 2.0    Latest Reference Range & Units 12/31/23 11:25  GFR, Estimated >60 mL/min >60     Old records , labs and images have been reviewed.   ASSESSMENT / PLAN / RECOMMENDATIONS:   Gender Dysphoria :  -Patient has not been to our clinic in a year and a half, has not had her medications either -I confirm with the patient the pronouns needed to be used, patient confirms that they would like she/her pronouns to be used -Patient has a beard, but states not shaving as waiting for laser hair removal appointment -No point in checking labs today as the patient has not had medications, will recheck on next visit -Refill on spironolactone  and estradiol  was sent today    Medications   Continue estradiol  valuate 20mg /mL to 20 mg (1 mL) monthly Take spironolactone  100 mg twice daily   Follow-up in 6 months  Signed electronically by: Stefano Redgie Butts, MD  [facility] End[facility]ology  [facility] [address] [place] [place], [place] [address] Phone: [phone] FAX: [phone]      CC: [facility], [facility] [address] [place] [address] Phone: [phone]  Fax: [phone]   Return to Endocrinology clinic as below: Future Appointments  Date Time Provider Department Center  08/07/2024 10:30 AM Nelline Lio, Donell Redgie, MD [facility] None  09/03/2024 10:15 AM Melvenia Corean SAILOR, NP [facility]

## 2024-08-07 NOTE — Telephone Encounter (Signed)
 I spoke to the patient and she reports she has had the medications transferred to [facility] on [address]

## 2024-08-10 LAB — CYTOLOGY - PAP: Diagnosis: NEGATIVE

## 2024-08-11 LAB — HEMOGLOBIN A1C
Hgb A1c MFr Bld: 7.2 % — ABNORMAL HIGH (ref <5.7)
Mean Plasma Glucose: 160 mg/dL
eAG (mmol/L): 8.9 mmol/L

## 2024-08-11 LAB — RPR: RPR Ser Ql: REACTIVE — AB

## 2024-08-11 LAB — HIV-1 RNA QUANT-NO REFLEX-BLD
HIV 1 RNA Quant: <20 {copies}/mL — AB
HIV-1 RNA Quant, Log: <1.3 {Log_copies}/mL — AB

## 2024-08-11 LAB — RPR TITER: RPR Titer: 1:4 {titer} — ABNORMAL HIGH

## 2024-08-11 LAB — T PALLIDUM AB: T Pallidum Abs: POSITIVE — AB

## 2024-08-14 ENCOUNTER — Other Ambulatory Visit: Payer: Self-pay

## 2024-08-14 ENCOUNTER — Ambulatory Visit
Admission: EM | Admit: 2024-08-14 | Discharge: 2024-08-14 | Disposition: A | Discharge status: Home / Self Care | Attending: Family Medicine | Admitting: Family Medicine

## 2024-08-14 DIAGNOSIS — S93402A Sprain of unspecified ligament of left ankle, initial encounter: Secondary | ICD-10-CM

## 2024-08-14 NOTE — ED Triage Notes (Signed)
 Pt states he tripped over a rock an hour ago and injured left ankle. Pt c/o left inner ankle pain. Pt has trace swelling.

## 2024-08-14 NOTE — ED Provider Notes (Signed)
 [facility]    CSN: [id number] Arrival date & time: 08/14/24  1850      History   Chief Complaint No chief complaint on file.   HPI Rhonda Ayers is a 43 y.o. adult presents for ankle pain.  Patient reports an hour prior to arrival he was raking some leaves when he tripped over a rock injuring his left ankle.  He reports some swelling to the inner aspect of the ankle with pain with weightbearing.  No numbness tingling bruising.  No history of fractures to the ankle in the past.  He has not used any OTC medications or treatments since onset.  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   Left leg pain 07/03/2016   Pre-diabetes 11/23/2021   Rectal pain 12/15/2018   Screening for STDs (sexually transmitted diseases) 07/05/2023   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   Type 2 diabetes mellitus without complication, without long-term current use of insulin (HCC) 09/24/2022   Weight gain 07/31/2022   Work related injury 09/01/2018    Patient Active Problem List   Diagnosis Date Noted   Asthma    Constipation 07/05/2023   Screening for STDs (sexually transmitted diseases) 07/05/2023   Chest pain 05/13/2023   Weight gain 07/31/2022   Pre-diabetes 11/23/2021   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      Home Medications    Prior to Admission medications   Medication Sig Start Date End Date Taking? Authorizing Provider  spironolactone  (ALDACTONE ) 100 MG tablet Take 100 mg by mouth daily.   Yes [provider]  dolutegravir -lamiVUDine (DOVATO ) 50-300 MG tablet Take 1 tablet by mouth daily. 08/06/24   Melvenia Corean SAILOR, NP  Estradiol  Valerate 10 MG/ML OIL Inject 2 mLs (20 mg total) into the muscle every 30 (thirty) days. 03/05/24   Shamleffer, Ibtehal Jaralla, MD  lisinopril (ZESTRIL) 10 MG tablet TAKE 1 TABLET(10 MG) BY MOUTH DAILY 08/07/24   Melvenia Corean SAILOR, NP  nicotine (NICODERM CQ - DOSED IN MG/24 HR) 7 mg/24hr patch Place 1 patch (7 mg total) onto the skin daily. 08/07/24   Melvenia Corean SAILOR, NP  ondansetron  (ZOFRAN -ODT) 4 MG disintegrating tablet Take 1 tablet (4 mg total) by mouth every 8 (eight) hours as needed. 11/15/23   Henderly, Britni A, PA-C  SYRINGE-NEEDLE, DISP, 3 ML (LUER LOCK SAFETY SYRINGES) 22G X 1 3 ML MISC 1 Device by Does not apply route every 30 (thirty) days. 01/03/24   Shamleffer, Donell Cardinal, MD    Family History Family History  Problem Relation Age of Onset   Diabetes Mother     Social History Social History   Tobacco Use   Smoking status: Former    Current packs/day: 0.10  Types: Cigarettes   Smokeless tobacco: Never   Tobacco comments:    2 cigarettes a day   Vaping Use   Vaping status: Never Used  Substance Use Topics   Alcohol use: Not Currently   Drug use: Not Currently    Types: Marijuana     Allergies   Bee venom, Tramadol , and Tylenol  [acetaminophen ]   Review of Systems Review of Systems  Musculoskeletal:        Left ankle pain     Physical Exam Triage Vital Signs ED Triage Vitals  Encounter Vitals Group     BP 08/14/24 1909 132/84     Girls Systolic BP Percentile --      Girls Diastolic BP Percentile --      Boys Systolic BP Percentile --      Boys Diastolic BP  Percentile --      Pulse Rate 08/14/24 1909 91     Resp 08/14/24 1909 17     Temp 08/14/24 1909 98 F (36.7 C)     Temp Source 08/14/24 1909 Oral     SpO2 08/14/24 1909 92 %     Weight --      Height --      Head Circumference --      Peak Flow --      Pain Score 08/14/24 1907 10     Pain Loc --      Pain Education --      Exclude from Growth Chart --    No data found.  Updated Vital Signs BP 132/84   Pulse 91   Temp 98 F (36.7 C) (Oral)   Resp 17   SpO2 92%   Visual Acuity Right Eye Distance:   Left Eye Distance:   Bilateral Distance:    Right Eye Near:   Left Eye Near:    Bilateral Near:     Physical Exam Vitals and nursing note reviewed.  Constitutional:      Appearance: Normal appearance.  HENT:     Head: Normocephalic and atraumatic.  Eyes:     Pupils: Pupils are equal, round, and reactive to light.  Cardiovascular:     Rate and Rhythm: Normal rate.  Pulmonary:     Effort: Pulmonary effort is normal.  Musculoskeletal:     Left ankle: Swelling present. No deformity, ecchymosis or lacerations. Tenderness present over the medial malleolus. No lateral malleolus, base of 5th metatarsal or proximal fibula tenderness. Normal range of motion. Normal pulse.     Comments: No tenderness with palpation to foot.  Noted healed surgical scar overlying distal first metatarsal  Skin:    General: Skin is warm and dry.  Neurological:     General: No focal deficit present.     Mental Status: She is alert and oriented to person, place, and time.  Psychiatric:        Mood and Affect: Mood normal.        Behavior: Behavior normal.      UC Treatments / Results  Labs (all labs ordered are listed, but only abnormal results are displayed) Labs Reviewed - No data to display  EKG   Radiology No results found.  Procedures Procedures (including critical care time)  Medications Ordered in UC Medications - No data to display  Initial Impression / Assessment and  Plan / UC Course  I have reviewed the triage vital signs and the nursing notes.  Pertinent labs & imaging results that were available during my care of  the patient were reviewed by me and considered in my medical decision making (see chart for details).     Reviewed exam and symptoms with patient.  No red flags.  He declines x-ray stating he only needs a note for work.  Advised RICE therapy and OTC analgesics and follow-up if symptoms do not improve.  ER precautions reviewed Final Clinical Impressions(s) / UC Diagnoses   Final diagnoses:  Sprain of left ankle, unspecified ligament, initial encounter     Discharge Instructions      Use the Ace wrap to help with swelling and to support the joint.  You may elevate and ice as needed.  You may take over-the-counter Tylenol  ibuprofen  as needed for pain.  Follow-up your PCP 1 week if your symptoms do not improve.  Please go to the ER for any worsening symptoms.  Hope you feel better soon!    ED Prescriptions   None    PDMP not reviewed this encounter.   Loreda Myla SAUNDERS, NP 08/14/24 [phone]

## 2024-08-14 NOTE — Discharge Instructions (Signed)
 Use the Ace wrap to help with swelling and to support the joint.  You may elevate and ice as needed.  You may take over-the-counter Tylenol  ibuprofen  as needed for pain.  Follow-up your PCP 1 week if your symptoms do not improve.  Please go to the ER for any worsening symptoms.  Hope you feel better soon!

## 2024-09-03 ENCOUNTER — Ambulatory Visit: Admitting: Infectious Diseases

## 2024-09-23 ENCOUNTER — Other Ambulatory Visit: Payer: Self-pay | Admitting: Infectious Diseases

## 2024-09-23 DIAGNOSIS — I10 Essential (primary) hypertension: Secondary | ICD-10-CM

## 2024-09-23 NOTE — Telephone Encounter (Signed)
 Looks like endo put her back on spironolactone  for testosterone  suppression -   If she plans to continue that I wan to switch to a different blood pressure medication called amlodipine  to avoid side effects that lisinopril  + spiro can have when taken together.   Will refuse the rx for now and have her call the office until she can give us  an update

## 2024-09-23 NOTE — Telephone Encounter (Signed)
 Okay to refill?

## 2024-10-02 ENCOUNTER — Ambulatory Visit: Admitting: Nurse Practitioner

## 2024-11-03 ENCOUNTER — Ambulatory Visit
Admission: EM | Admit: 2024-11-03 | Discharge: 2024-11-03 | Disposition: A | Discharge status: Home / Self Care | Attending: Family Medicine | Admitting: Family Medicine

## 2024-11-03 DIAGNOSIS — R051 Acute cough: Secondary | ICD-10-CM

## 2024-11-03 DIAGNOSIS — J069 Acute upper respiratory infection, unspecified: Secondary | ICD-10-CM | POA: Diagnosis not present

## 2024-11-03 LAB — POC COVID19/FLU A&B COMBO
Covid Antigen, POC: NEGATIVE
Influenza A Antigen, POC: NEGATIVE
Influenza B Antigen, POC: NEGATIVE

## 2024-11-03 MED ORDER — BENZONATATE 200 MG PO CAPS: 200.0000 mg | ORAL_CAPSULE | Freq: Three times a day (TID) | ORAL | 0 refills | Status: AC | PRN

## 2024-11-03 NOTE — ED Triage Notes (Signed)
 Pt present with c/o a dry cough x 4 days. Pt states they have a hx of bronchitis. States they recently stopped smoking. Pt states they have taken Mucinex and has not had any relief. States they have chest tightness.

## 2024-11-03 NOTE — ED Provider Notes (Signed)
 " [facility]    CSN: [id number] Arrival date & time: 11/03/24  1522      History   Chief Complaint Chief Complaint  Patient presents with   Cough    HPI Rhonda Ayers is a 44 y.o. adult  presents for evaluation of URI symptoms for 4 days. Patient reports associated symptoms of dry cough with dry mouth. Denies N/V/D, congestion, sore throat, fevers, ear pain, body aches or shortness of breath. Patient does not have a hx of asthma. Patient states he quit smoking last week.   Reports no known sick contacts.  Pt has taken Mucinex OTC for symptoms. Pt has no other concerns at this time.    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   Left leg pain 07/03/2016   Pre-diabetes 11/23/2021   Rectal pain 12/15/2018   Screening for STDs (sexually transmitted diseases) 07/05/2023   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   Type 2 diabetes mellitus without complication, without long-term current use of insulin (HCC) 09/24/2022   Weight gain 07/31/2022   Work related injury 09/01/2018    Patient Active Problem List   Diagnosis Date Noted   Asthma    Constipation 07/05/2023   Screening for STDs (sexually transmitted diseases) 07/05/2023   Chest pain 05/13/2023   Weight gain 07/31/2022   Pre-diabetes 11/23/2021   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      Home Medications    Prior to Admission medications  Medication Sig Start Date End Date Taking? Authorizing Provider  benzonatate  (TESSALON ) 200 MG capsule Take 1 capsule (200 mg total) by mouth 3 (three) times daily as needed. 11/03/24  Yes Riyah Bardon, Jodi R, NP  dolutegravir -lamiVUDine (DOVATO ) 50-300 MG tablet Take 1 tablet by mouth daily. 08/06/24   Melvenia Corean SAILOR, NP  Estradiol  Valerate 10 MG/ML OIL Inject 2 mLs (20 mg total) into the muscle every 30 (thirty) days. 03/05/24   Shamleffer, Ibtehal Jaralla, MD  lisinopril  (ZESTRIL ) 10 MG tablet TAKE 1 TABLET(10 MG) BY MOUTH DAILY 08/07/24   Melvenia Corean SAILOR, NP  nicotine  (NICODERM CQ  - DOSED IN MG/24 HR) 7 mg/24hr patch Place 1 patch (7 mg total) onto the skin daily. 08/07/24   Melvenia Corean SAILOR, NP  ondansetron  (ZOFRAN -ODT) 4 MG disintegrating tablet Take 1 tablet (4 mg total) by mouth every 8 (eight) hours as needed. 11/15/23   Henderly, Britni A, PA-C  spironolactone  (ALDACTONE ) 100 MG tablet Take 100 mg by mouth daily.    [provider]  SYRINGE-NEEDLE, DISP, 3 ML (LUER LOCK SAFETY SYRINGES) 22G X 1 3 ML MISC 1 Device by Does not apply route every 30 (thirty) days. 01/03/24   Shamleffer, Donell Cardinal, MD    Family History Family History  Problem Relation Age  of Onset   Diabetes Mother     Social History Social History[1]   Allergies   Bee venom, Tramadol, and Tylenol  [acetaminophen ]   Review of Systems Review of Systems  Respiratory:  Positive for cough.      Physical Exam Triage Vital Signs ED Triage Vitals  Encounter Vitals Group     BP 11/03/24 1637 136/84     Girls Systolic BP Percentile --      Girls Diastolic BP Percentile --      Boys Systolic BP Percentile --      Boys Diastolic BP Percentile --      Pulse Rate 11/03/24 1637 99     Resp 11/03/24 1637 16     Temp 11/03/24 1637 97.9 F (36.6 C)     Temp Source 11/03/24 1637 Oral      SpO2 11/03/24 1637 92 %     Weight --      Height --      Head Circumference --      Peak Flow --      Pain Score 11/03/24 1635 0     Pain Loc --      Pain Education --      Exclude from Growth Chart --    No data found.  Updated Vital Signs BP 136/84 (BP Location: Right Arm)   Pulse 99   Temp 97.9 F (36.6 C) (Oral)   Resp 16   SpO2 92%   Visual Acuity Right Eye Distance:   Left Eye Distance:   Bilateral Distance:    Right Eye Near:   Left Eye Near:    Bilateral Near:     Physical Exam Vitals and nursing note reviewed.  Constitutional:      General: He is not in acute distress.    Appearance: He is well-developed. He is not ill-appearing.  HENT:     Head: Normocephalic and atraumatic.     Right Ear: Tympanic membrane and ear canal normal.     Left Ear: Tympanic membrane and ear canal normal.     Nose: No congestion.     Mouth/Throat:     Mouth: Mucous membranes are moist.     Pharynx: Oropharynx is clear. Uvula midline. No posterior oropharyngeal erythema.     Tonsils: No tonsillar exudate or tonsillar abscesses.  Eyes:     Conjunctiva/sclera: Conjunctivae normal.     Pupils: Pupils are equal, round, and reactive to light.  Cardiovascular:     Rate and Rhythm: Normal rate and regular rhythm.     Heart sounds: Normal heart sounds.  Pulmonary:     Effort: Pulmonary effort is normal.     Breath sounds: Normal breath sounds. No wheezing or rales.  Musculoskeletal:     Cervical back: Normal range of motion and neck supple.  Lymphadenopathy:     Cervical: No cervical adenopathy.  Skin:    General: Skin is warm and dry.  Neurological:     General: No focal deficit present.     Mental Status: He is alert and oriented to person, place, and time.  Psychiatric:        Mood and Affect: Mood normal.        Behavior: Behavior normal.      UC Treatments / Results  Labs (all labs ordered are listed, but only abnormal results are displayed) Labs Reviewed  POC  COVID19/FLU A&B COMBO    EKG   Radiology No results found.  Procedures Procedures (including critical  care time)  Medications Ordered in UC Medications - No data to display  Initial Impression / Assessment and Plan / UC Course  I have reviewed the triage vital signs and the nursing notes.  Pertinent labs & imaging results that were available during my care of the patient were reviewed by me and considered in my medical decision making (see chart for details).  Clinical Course as of 11/03/24 1739  Tue Nov 03, 2024  1709 O2 recheck 96% on room air [JM]    Clinical Course User Index [JM] Loreda Myla SAUNDERS, NP    Reviewed exam and symptoms with patient.  No red flags.  He is well-appearing and in no acute distress.  Negative COVID and flu testing.  Discussed viral upper respiratory illness and symptomatic treatment.  Tessalon  as needed for cough.  Advise rest fluids and PCP follow-up 2 to 3 days for recheck.  ER precautions reviewed. Final Clinical Impressions(s) / UC Diagnoses   Final diagnoses:  Acute cough  Viral upper respiratory illness     Discharge Instructions      You tested negative for COVID and flu.  May start Tessalon  3 times a day as needed for your cough.  Lots of rest and fluids.  Follow-up with your PCP in 2 to 3 days for recheck.  Please go to the emergency room if you develop any worsening symptoms.  Hope you feel better soon!     ED Prescriptions     Medication Sig Dispense Auth. Provider   benzonatate  (TESSALON ) 200 MG capsule Take 1 capsule (200 mg total) by mouth 3 (three) times daily as needed. 20 capsule Sherrine Salberg, Jodi R, NP      PDMP not reviewed this encounter.     [1]  Social History Tobacco Use   Smoking status: Former    Current packs/day: 0.10    Types: Cigarettes   Smokeless tobacco: Never   Tobacco comments:    2 cigarettes a day   Vaping Use   Vaping status: Never Used  Substance Use Topics   Alcohol use: Not Currently    Drug use: Not Currently    Types: Marijuana     Loreda Myla SAUNDERS, NP 11/03/24 1739  "

## 2024-11-03 NOTE — Discharge Instructions (Addendum)
 You tested negative for COVID and flu.  May start Tessalon  3 times a day as needed for your cough.  Lots of rest and fluids.  Follow-up with your PCP in 2 to 3 days for recheck.  Please go to the emergency room if you develop any worsening symptoms.  Hope you feel better soon!

## 2024-11-11 NOTE — Telephone Encounter (Signed)
 Patient called requesting refill on BP medication. Informed her she would need to switch if still taking spironolactone . Patient agreeable with plan. Requesting for RX to be sent to [facility] special[facility].    Katey Barrie SHAUNNA Letters, CMA

## 2024-11-12 ENCOUNTER — Other Ambulatory Visit: Payer: Self-pay | Admitting: Internal Medicine

## 2024-11-12 MED ORDER — AMLODIPINE BESYLATE 5 MG PO TABS: 5.0000 mg | ORAL_TABLET | Freq: Every day | ORAL | 2 refills | Status: AC

## 2024-11-12 NOTE — Addendum Note (Signed)
 Addended by: MELVENIA COREAN SAILOR on: 11/12/2024 02:27 PM   Modules accepted: Orders

## 2024-11-12 NOTE — Telephone Encounter (Signed)
 Will replace the lisinopril  with amlodipine  5 mg tablet.   Would like for her to get a blood pressure check in in 4-6 weeks if she can - would probably be good if she has one at home she can use.  Unfortunately Medicaid won't cover one. She can get an automated one off [employer] or pharmacy for $25-50.

## 2024-11-13 ENCOUNTER — Other Ambulatory Visit: Payer: Self-pay | Admitting: Infectious Diseases

## 2024-11-13 ENCOUNTER — Ambulatory Visit: Admitting: Physician Assistant

## 2024-11-17 ENCOUNTER — Ambulatory Visit: Admitting: Nurse Practitioner

## 2024-11-17 NOTE — Progress Notes (Unsigned)
 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   Left leg pain 07/03/2016   Pre-diabetes 11/23/2021   Rectal pain 12/15/2018   Screening for STDs (sexually transmitted diseases) 07/05/2023   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   Type 2 diabetes mellitus without complication, without long-term current use of insulin (HCC) 09/24/2022   Weight gain 07/31/2022   Work related injury 09/01/2018   Past Surgical History:  Procedure Laterality Date   FOOT SURGERY     NASAL SEPTUM SURGERY      Allergies  Allergies[1]   Labs/Other Studies Reviewed    The following studies were reviewed today: ***    Recent Labs: 12/31/2023: Magnesium 2.0 07/15/2024: ALT 78; Pro Brain Natriuretic Peptide <50.0; TSH 3.020 08/05/2024: BUN 13; Creatinine, Ser 0.98; Hemoglobin 17.2; Platelets 265; Potassium 4.0; Sodium 133  Recent Lipid Panel    Component Value Date/Time   CHOL 256 (H) 09/02/2023 1026   TRIG 230 (H) 09/02/2023 1026   HDL 37 (L) 09/02/2023 1026   CHOLHDL 6.9 (H) 09/02/2023 1026   VLDL 28 12/12/2016 1449   LDLCALC 180 (H) 09/02/2023 1026    History of Present Illness    ***  44 year old female with a history of hypertension, type 2 diabetes, HIV, obesity, and tobacco use  She was seen in the ED on 08/05/2024 in the setting of chest pain.  BP was significantly elevated at  the time, SBP in the 190s.  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  She was seen by ID on 08/06/2024 and was started on lisinopril .  She was referred to cardiology for further evaluation.  She presents today for [facility] new [facility] evaluation.   CC: -Initial onset:  -Frequency/Duration:  -Associated symptoms:  -Aggravating/alleviating factors:  -Syncope/near syncope: -Prior cardiac history: -Prior ECG:  -Prior workup: -Prior treatment:  -Possible medication interactions:  -Caffeine:  -Alcohol:  -Tobacco: -OTC supplements:  -Comorbidities:  -Exercise level:  -Labs:  -Cardiac ROS: no chest pain, no shortness of breath, no PND, no orthopnea, no LE edema. She denies any headaches. -Family history:   -Specialists:    Home Medications    Current Outpatient Medications  Medication Sig Dispense Refill   amLODipine  (NORVASC ) 5 MG tablet Take 1 tablet (5 mg total) by mouth daily. 30 tablet 2   benzonatate  (TESSALON ) 200 MG capsule Take 1 capsule (200 mg total) by mouth 3 (three) times daily as needed. 20 capsule 0   dolutegravir -lamiVUDine (DOVATO ) 50-300 MG tablet Take 1 tablet by mouth daily. 30 tablet 5   Estradiol  Valerate 10 MG/ML OIL Inject 2 mLs (20 mg total) into the muscle every 30 (thirty) days. 15 mL 2   nicotine  (NICODERM CQ  - DOSED IN MG/24 HR) 7 mg/24hr patch Place 1 patch (7 mg total) onto the skin daily. 30 patch 1   ondansetron  (ZOFRAN -ODT) 4 MG disintegrating  tablet Take 1 tablet (4 mg total) by mouth every 8 (eight) hours as needed. 20 tablet 0   spironolactone  (ALDACTONE ) 100 MG tablet Take 100 mg by mouth daily.     SYRINGE-NEEDLE, DISP, 3 ML (LUER LOCK SAFETY SYRINGES) 22G X 1 3 ML MISC 1 Device by Does not apply route every 30 (thirty) days. 10 each 3   No current facility-administered medications for this visit.     Review of Systems    ***.  All other systems reviewed and are otherwise negative except as noted above.    Physical  Exam    VS:  There were no vitals taken for this visit. , BMI There is no height or weight on file to calculate BMI.     GEN: Well nourished, well developed, in no acute distress. HEENT: normal. Neck: Supple, no JVD, carotid bruits, or masses. Cardiac: RRR, no murmurs, rubs, or gallops. No clubbing, cyanosis, edema.  Radials/DP/PT 2+ and equal bilaterally.  Respiratory:  Respirations regular and unlabored, clear to auscultation bilaterally. GI: Soft, nontender, nondistended, BS + x 4. MS: no deformity or atrophy. Skin: warm and dry, no rash. Neuro:  Strength and sensation are intact. Psych: Normal affect.  Accessory Clinical Findings    ECG personally reviewed by me today -    - no acute changes.   Lab Results  Component Value Date   WBC 5.0 08/05/2024   HGB 17.2 (H) 08/05/2024   HCT 48.7 (H) 08/05/2024   MCV 91.5 08/05/2024   PLT 265 08/05/2024   Lab Results  Component Value Date   CREATININE 0.98 08/05/2024   BUN 13 08/05/2024   NA 133 (L) 08/05/2024   K 4.0 08/05/2024   CL 97 (L) 08/05/2024   CO2 23 08/05/2024   Lab Results  Component Value Date   ALT 78 (H) 07/15/2024   AST 61 (H) 07/15/2024   ALKPHOS 82 07/15/2024   BILITOT 0.5 07/15/2024   Lab Results  Component Value Date   CHOL 256 (H) 09/02/2023   HDL 37 (L) 09/02/2023   LDLCALC 180 (H) 09/02/2023   TRIG 230 (H) 09/02/2023   CHOLHDL 6.9 (H) 09/02/2023    Lab Results  Component Value Date   HGBA1C 7.2 (H) 08/06/2024    Assessment & Plan    1.  ***  No BP recorded.  {Refresh Note OR Click here to enter BP  :1}***   Damien JAYSON Braver, NP 11/17/2024, 6:52 AM       [1]  Allergies Allergen Reactions   Bee Venom Swelling   Tramadol Other (See Comments)    shakes   Tylenol  [Acetaminophen ]    "

## 2024-11-24 ENCOUNTER — Ambulatory Visit: Admitting: Cardiology

## 2024-11-27 ENCOUNTER — Ambulatory Visit: Admitting: Nurse Practitioner

## 2024-12-08 ENCOUNTER — Ambulatory Visit: Admitting: Pediatrics

## 2025-01-12 ENCOUNTER — Ambulatory Visit: Payer: Self-pay | Admitting: Infectious Diseases
